# Patient Record
Sex: Female | Born: 1965
Health system: Southern US, Community
[De-identification: ages and names within clinical notes are randomized; demographics above are authoritative.]

## PROBLEM LIST (undated history)

## (undated) DIAGNOSIS — T4145XA Adverse effect of unspecified anesthetic, initial encounter: Secondary | ICD-10-CM

## (undated) DIAGNOSIS — Z90710 Acquired absence of both cervix and uterus: Secondary | ICD-10-CM

## (undated) DIAGNOSIS — I341 Nonrheumatic mitral (valve) prolapse: Secondary | ICD-10-CM

## (undated) DIAGNOSIS — Z1509 Genetic susceptibility to other malignant neoplasm: Secondary | ICD-10-CM

## (undated) DIAGNOSIS — I480 Paroxysmal atrial fibrillation: Secondary | ICD-10-CM

## (undated) DIAGNOSIS — Z86010 Personal history of colonic polyps: Secondary | ICD-10-CM

## (undated) DIAGNOSIS — F99 Mental disorder, not otherwise specified: Secondary | ICD-10-CM

## (undated) DIAGNOSIS — C179 Malignant neoplasm of small intestine, unspecified: Secondary | ICD-10-CM

## (undated) DIAGNOSIS — T8859XA Other complications of anesthesia, initial encounter: Secondary | ICD-10-CM

## (undated) DIAGNOSIS — K5730 Diverticulosis of large intestine without perforation or abscess without bleeding: Secondary | ICD-10-CM

## (undated) DIAGNOSIS — I471 Supraventricular tachycardia, unspecified: Secondary | ICD-10-CM

## (undated) DIAGNOSIS — IMO0002 Reserved for concepts with insufficient information to code with codable children: Secondary | ICD-10-CM

## (undated) DIAGNOSIS — F419 Anxiety disorder, unspecified: Secondary | ICD-10-CM

## (undated) DIAGNOSIS — R0602 Shortness of breath: Secondary | ICD-10-CM

## (undated) HISTORY — PX: ABDOMINAL HYSTERECTOMY: SHX81

## (undated) HISTORY — DX: Reserved for concepts with insufficient information to code with codable children: IMO0002

## (undated) HISTORY — DX: Nonrheumatic mitral (valve) prolapse: I34.1

## (undated) HISTORY — PX: BACK SURGERY: SHX140

## (undated) HISTORY — DX: Acquired absence of both cervix and uterus: Z90.710

## (undated) HISTORY — PX: TONSILLECTOMY: SUR1361

## (undated) HISTORY — PX: URETHRA SURGERY: SHX824

## (undated) HISTORY — DX: Genetic susceptibility to other malignant neoplasm: Z15.09

## (undated) HISTORY — DX: Supraventricular tachycardia: I47.1

## (undated) HISTORY — PX: CARDIAC ELECTROPHYSIOLOGY MAPPING AND ABLATION: SHX1292

## (undated) HISTORY — DX: Diverticulosis of large intestine without perforation or abscess without bleeding: K57.30

## (undated) HISTORY — DX: Paroxysmal atrial fibrillation: I48.0

## (undated) HISTORY — DX: Personal history of colonic polyps: Z86.010

## (undated) HISTORY — PX: COLONOSCOPY: SHX174

## (undated) HISTORY — DX: Malignant neoplasm of small intestine, unspecified: C17.9

## (undated) HISTORY — DX: Supraventricular tachycardia, unspecified: I47.10

## (undated) HISTORY — PX: ABDOMINAL HERNIA REPAIR: SHX539

---

## 2004-11-08 ENCOUNTER — Ambulatory Visit: Payer: Self-pay | Admitting: Family Medicine

## 2004-11-27 ENCOUNTER — Ambulatory Visit: Payer: Self-pay | Admitting: Family Medicine

## 2005-01-09 ENCOUNTER — Other Ambulatory Visit: Admission: RE | Admit: 2005-01-09 | Discharge: 2005-01-09 | Payer: Self-pay | Admitting: Family Medicine

## 2005-01-09 ENCOUNTER — Ambulatory Visit: Payer: Self-pay | Admitting: Family Medicine

## 2005-01-09 LAB — CONVERTED CEMR LAB: Pap Smear: NORMAL

## 2005-01-16 ENCOUNTER — Ambulatory Visit: Payer: Self-pay | Admitting: Family Medicine

## 2005-02-20 ENCOUNTER — Ambulatory Visit: Payer: Self-pay | Admitting: Family Medicine

## 2005-02-27 ENCOUNTER — Ambulatory Visit: Payer: Self-pay | Admitting: Family Medicine

## 2005-03-13 ENCOUNTER — Ambulatory Visit: Payer: Self-pay | Admitting: Family Medicine

## 2005-04-10 ENCOUNTER — Ambulatory Visit: Payer: Self-pay | Admitting: Family Medicine

## 2005-10-02 ENCOUNTER — Ambulatory Visit: Payer: Self-pay | Admitting: Family Medicine

## 2005-10-09 DIAGNOSIS — I059 Rheumatic mitral valve disease, unspecified: Secondary | ICD-10-CM | POA: Insufficient documentation

## 2005-10-09 DIAGNOSIS — F331 Major depressive disorder, recurrent, moderate: Secondary | ICD-10-CM | POA: Insufficient documentation

## 2005-10-09 DIAGNOSIS — I4891 Unspecified atrial fibrillation: Secondary | ICD-10-CM

## 2005-10-09 DIAGNOSIS — F17200 Nicotine dependence, unspecified, uncomplicated: Secondary | ICD-10-CM | POA: Insufficient documentation

## 2005-10-09 DIAGNOSIS — IMO0002 Reserved for concepts with insufficient information to code with codable children: Secondary | ICD-10-CM | POA: Insufficient documentation

## 2005-10-09 DIAGNOSIS — F339 Major depressive disorder, recurrent, unspecified: Secondary | ICD-10-CM

## 2005-10-09 DIAGNOSIS — F411 Generalized anxiety disorder: Secondary | ICD-10-CM | POA: Insufficient documentation

## 2005-11-16 ENCOUNTER — Encounter: Payer: Self-pay | Admitting: Family Medicine

## 2005-11-19 ENCOUNTER — Ambulatory Visit: Payer: Self-pay | Admitting: Family Medicine

## 2005-11-19 DIAGNOSIS — K5909 Other constipation: Secondary | ICD-10-CM

## 2005-11-21 ENCOUNTER — Encounter: Admission: RE | Admit: 2005-11-21 | Discharge: 2005-11-21 | Payer: Self-pay | Admitting: Family Medicine

## 2006-01-28 ENCOUNTER — Ambulatory Visit: Payer: Self-pay | Admitting: Family Medicine

## 2006-01-28 LAB — CONVERTED CEMR LAB: Rapid Strep: NEGATIVE

## 2006-02-05 ENCOUNTER — Ambulatory Visit: Payer: Self-pay | Admitting: Family Medicine

## 2006-02-06 LAB — CONVERTED CEMR LAB
ALT: 11 units/L (ref 0–35)
BUN: 19 mg/dL (ref 6–23)
CO2: 15 meq/L — ABNORMAL LOW (ref 19–32)
Calcium: 8.9 mg/dL (ref 8.4–10.5)
Chloride: 112 meq/L (ref 96–112)
Creatinine, Ser: 0.71 mg/dL (ref 0.40–1.20)
Glucose, Bld: 77 mg/dL (ref 70–99)
Hemoglobin: 13 g/dL (ref 12.0–15.0)
Lymphocytes Relative: 41 % (ref 12–46)
Lymphs Abs: 5.2 10*3/uL — ABNORMAL HIGH (ref 0.7–3.3)
Monocytes Absolute: 1.1 10*3/uL — ABNORMAL HIGH (ref 0.2–0.7)
Monocytes Relative: 9 % (ref 3–11)
Neutro Abs: 6.2 10*3/uL (ref 1.7–7.7)
RBC: 4.17 M/uL (ref 3.87–5.11)
TSH: 1.268 microintl units/mL (ref 0.350–5.50)

## 2006-02-11 ENCOUNTER — Telehealth: Payer: Self-pay | Admitting: Family Medicine

## 2006-02-15 ENCOUNTER — Ambulatory Visit: Payer: Self-pay | Admitting: Family Medicine

## 2006-02-15 LAB — CONVERTED CEMR LAB
Bilirubin Urine: NEGATIVE
Blood in Urine, dipstick: NEGATIVE
Glucose, Urine, Semiquant: NEGATIVE
Ketones, urine, test strip: NEGATIVE
Nitrite: NEGATIVE
Protein, U semiquant: NEGATIVE
Specific Gravity, Urine: 1.02
Urobilinogen, UA: NEGATIVE
WBC Urine, dipstick: NEGATIVE
pH: 5.5

## 2006-02-16 ENCOUNTER — Encounter: Payer: Self-pay | Admitting: Family Medicine

## 2006-02-16 LAB — CONVERTED CEMR LAB
Basophils Relative: 1 % (ref 0–1)
Eosinophils Absolute: 0.1 10*3/uL (ref 0.0–0.7)
Glucose, Bld: 89 mg/dL (ref 70–99)
Lymphs Abs: 4.1 10*3/uL — ABNORMAL HIGH (ref 0.7–3.3)
MCV: 97 fL (ref 78.0–100.0)
Neutrophils Relative %: 43 % (ref 43–77)
Platelets: 315 10*3/uL (ref 150–400)
Potassium: 4.5 meq/L (ref 3.5–5.3)
Sodium: 141 meq/L (ref 135–145)
Vit D, 1,25-Dihydroxy: 58 — ABNORMAL HIGH (ref 20–57)
WBC: 8.7 10*3/uL (ref 4.0–10.5)

## 2006-02-20 ENCOUNTER — Telehealth: Payer: Self-pay | Admitting: Family Medicine

## 2006-02-21 ENCOUNTER — Encounter: Payer: Self-pay | Admitting: Family Medicine

## 2006-02-21 ENCOUNTER — Telehealth: Payer: Self-pay | Admitting: Family Medicine

## 2006-03-12 ENCOUNTER — Encounter: Payer: Self-pay | Admitting: Family Medicine

## 2006-04-15 ENCOUNTER — Encounter: Payer: Self-pay | Admitting: Family Medicine

## 2006-05-03 ENCOUNTER — Ambulatory Visit: Payer: Self-pay | Admitting: Family Medicine

## 2006-05-03 ENCOUNTER — Telehealth (INDEPENDENT_AMBULATORY_CARE_PROVIDER_SITE_OTHER): Payer: Self-pay | Admitting: *Deleted

## 2006-05-03 ENCOUNTER — Encounter: Payer: Self-pay | Admitting: Family Medicine

## 2006-05-03 LAB — CONVERTED CEMR LAB
CO2: 19 meq/L (ref 19–32)
Calcium: 8.9 mg/dL (ref 8.4–10.5)
Creatinine, Ser: 0.69 mg/dL (ref 0.40–1.20)
Eosinophils Relative: 1 % (ref 0–5)
Glucose, Bld: 86 mg/dL (ref 70–99)
HCT: 42.5 % (ref 36.0–46.0)
Hemoglobin: 14.6 g/dL (ref 12.0–15.0)
Lipase: 23 units/L (ref 0–75)
Lymphocytes Relative: 36 % (ref 12–46)
Lymphs Abs: 3.3 10*3/uL (ref 0.7–3.3)
Monocytes Absolute: 0.7 10*3/uL (ref 0.2–0.7)
Monocytes Relative: 8 % (ref 3–11)
RBC: 4.39 M/uL (ref 3.87–5.11)
Total Bilirubin: 0.4 mg/dL (ref 0.3–1.2)
Total Protein: 6.8 g/dL (ref 6.0–8.3)
WBC: 9.4 10*3/uL (ref 4.0–10.5)

## 2006-05-09 ENCOUNTER — Telehealth (INDEPENDENT_AMBULATORY_CARE_PROVIDER_SITE_OTHER): Payer: Self-pay | Admitting: *Deleted

## 2006-05-21 ENCOUNTER — Ambulatory Visit: Payer: Self-pay | Admitting: Gastroenterology

## 2006-05-21 LAB — CONVERTED CEMR LAB
CRP, High Sensitivity: 2 (ref 0.00–5.00)
Sed Rate: 8 mm/hr (ref 0–25)

## 2006-06-03 ENCOUNTER — Encounter: Payer: Self-pay | Admitting: Family Medicine

## 2006-06-10 ENCOUNTER — Encounter: Payer: Self-pay | Admitting: Family Medicine

## 2006-06-11 ENCOUNTER — Encounter: Payer: Self-pay | Admitting: Family Medicine

## 2006-06-26 ENCOUNTER — Encounter: Payer: Self-pay | Admitting: Gastroenterology

## 2006-06-26 ENCOUNTER — Ambulatory Visit: Payer: Self-pay | Admitting: Gastroenterology

## 2006-06-26 ENCOUNTER — Encounter: Payer: Self-pay | Admitting: Family Medicine

## 2006-07-10 ENCOUNTER — Ambulatory Visit: Payer: Self-pay | Admitting: Gastroenterology

## 2006-07-24 ENCOUNTER — Encounter: Payer: Self-pay | Admitting: Family Medicine

## 2006-07-24 ENCOUNTER — Ambulatory Visit: Payer: Self-pay | Admitting: Gastroenterology

## 2006-07-24 ENCOUNTER — Encounter: Payer: Self-pay | Admitting: Gastroenterology

## 2006-08-01 ENCOUNTER — Ambulatory Visit: Payer: Self-pay | Admitting: Oncology

## 2006-10-24 ENCOUNTER — Ambulatory Visit: Payer: Self-pay | Admitting: Family Medicine

## 2006-10-25 ENCOUNTER — Telehealth (INDEPENDENT_AMBULATORY_CARE_PROVIDER_SITE_OTHER): Payer: Self-pay | Admitting: *Deleted

## 2006-10-25 LAB — CONVERTED CEMR LAB
Basophils Relative: 0 % (ref 0–1)
Eosinophils Absolute: 0.1 10*3/uL (ref 0.0–0.7)
Eosinophils Relative: 1 % (ref 0–5)
HCT: 39.8 % (ref 36.0–46.0)
MCHC: 34.7 g/dL (ref 30.0–36.0)
MCV: 94.5 fL (ref 78.0–100.0)
Monocytes Relative: 7 % (ref 3–11)
Neutrophils Relative %: 74 % (ref 43–77)
RBC: 4.21 M/uL (ref 3.87–5.11)

## 2006-11-04 ENCOUNTER — Ambulatory Visit: Payer: Self-pay | Admitting: Family Medicine

## 2006-11-04 DIAGNOSIS — N390 Urinary tract infection, site not specified: Secondary | ICD-10-CM | POA: Insufficient documentation

## 2006-11-04 LAB — CONVERTED CEMR LAB
Alkaline Phosphatase: 89 units/L (ref 39–117)
BUN: 12 mg/dL (ref 6–23)
Blood in Urine, dipstick: NEGATIVE
CO2: 19 meq/L (ref 19–32)
Creatinine, Ser: 0.84 mg/dL (ref 0.40–1.20)
Glucose, Bld: 93 mg/dL (ref 70–99)
Lipase: 25 units/L (ref 0–75)
Sodium: 142 meq/L (ref 135–145)
Specific Gravity, Urine: 1.015
Total Bilirubin: 0.2 mg/dL — ABNORMAL LOW (ref 0.3–1.2)
Urobilinogen, UA: 4
pH: 5

## 2006-11-06 ENCOUNTER — Telehealth (INDEPENDENT_AMBULATORY_CARE_PROVIDER_SITE_OTHER): Payer: Self-pay | Admitting: *Deleted

## 2006-11-14 ENCOUNTER — Ambulatory Visit: Payer: Self-pay | Admitting: Family Medicine

## 2006-11-14 LAB — CONVERTED CEMR LAB
Bilirubin Urine: NEGATIVE
Blood in Urine, dipstick: NEGATIVE
Glucose, Urine, Semiquant: NEGATIVE
Ketones, urine, test strip: NEGATIVE
Protein, U semiquant: NEGATIVE
Specific Gravity, Urine: 1.015
WBC Urine, dipstick: NEGATIVE
pH: 6.5

## 2006-11-15 ENCOUNTER — Encounter: Payer: Self-pay | Admitting: Family Medicine

## 2006-11-18 ENCOUNTER — Telehealth (INDEPENDENT_AMBULATORY_CARE_PROVIDER_SITE_OTHER): Payer: Self-pay | Admitting: *Deleted

## 2006-11-19 ENCOUNTER — Ambulatory Visit: Payer: Self-pay | Admitting: Gastroenterology

## 2006-11-19 LAB — CONVERTED CEMR LAB
Basophils Relative: 0.6 % (ref 0.0–1.0)
CRP, High Sensitivity: 2 (ref 0.00–5.00)
Eosinophils Relative: 1 % (ref 0.0–5.0)
Folate: >20 ng/mL
Hemoglobin: 12.8 g/dL (ref 12.0–15.0)
Lymphocytes Relative: 44.5 % (ref 12.0–46.0)
Monocytes Absolute: 0.9 10*3/uL — ABNORMAL HIGH (ref 0.2–0.7)
Monocytes Relative: 9.5 % (ref 3.0–11.0)
Neutro Abs: 4.4 10*3/uL (ref 1.4–7.7)
RDW: 12.1 % (ref 11.5–14.6)
Saturation Ratios: 20.7 % (ref 20.0–50.0)
Sed Rate: 11 mm/hr (ref 0–25)
Transferrin: 244.8 mg/dL (ref >=212.0)
Vitamin B-12: >1500 pg/mL — ABNORMAL HIGH (ref 211–911)
WBC: 10 10*3/uL (ref 4.5–10.5)

## 2006-11-21 ENCOUNTER — Ambulatory Visit: Payer: Self-pay | Admitting: Cardiology

## 2006-12-04 ENCOUNTER — Encounter: Payer: Self-pay | Admitting: Family Medicine

## 2007-02-06 ENCOUNTER — Telehealth: Payer: Self-pay | Admitting: Family Medicine

## 2007-02-06 DIAGNOSIS — N83209 Unspecified ovarian cyst, unspecified side: Secondary | ICD-10-CM | POA: Insufficient documentation

## 2007-02-20 DIAGNOSIS — K5730 Diverticulosis of large intestine without perforation or abscess without bleeding: Secondary | ICD-10-CM | POA: Insufficient documentation

## 2007-02-26 ENCOUNTER — Encounter: Payer: Self-pay | Admitting: Obstetrics & Gynecology

## 2007-02-26 ENCOUNTER — Ambulatory Visit: Payer: Self-pay | Admitting: Obstetrics & Gynecology

## 2007-06-10 ENCOUNTER — Telehealth: Payer: Self-pay | Admitting: Gastroenterology

## 2007-06-16 ENCOUNTER — Encounter: Payer: Self-pay | Admitting: Gastroenterology

## 2007-06-16 ENCOUNTER — Ambulatory Visit: Payer: Self-pay | Admitting: Gastroenterology

## 2007-06-17 ENCOUNTER — Encounter: Payer: Self-pay | Admitting: Gastroenterology

## 2007-06-20 ENCOUNTER — Ambulatory Visit: Payer: Self-pay | Admitting: Family Medicine

## 2007-06-20 LAB — CONVERTED CEMR LAB
Basophils Absolute: 0 10*3/uL (ref 0.0–0.1)
Basophils Relative: 0 % (ref 0–1)
Eosinophils Absolute: 0.3 10*3/uL (ref 0.0–0.7)
Eosinophils Relative: 2 % (ref 0–5)
Hemoglobin: 13.7 g/dL (ref 12.0–15.0)
MCHC: 33.1 g/dL (ref 30.0–36.0)
MCV: 97.7 fL (ref 78.0–100.0)
Monocytes Absolute: 0.6 10*3/uL (ref 0.1–1.0)
Monocytes Relative: 4 % (ref 3–12)
RBC: 4.23 M/uL (ref 3.87–5.11)
RDW: 12.9 % (ref 11.5–15.5)

## 2007-06-26 ENCOUNTER — Encounter: Payer: Self-pay | Admitting: Gastroenterology

## 2007-06-26 ENCOUNTER — Ambulatory Visit (HOSPITAL_COMMUNITY): Admission: RE | Admit: 2007-06-26 | Discharge: 2007-06-26 | Payer: Self-pay | Admitting: Gastroenterology

## 2007-06-26 DIAGNOSIS — Z860100 Personal history of colon polyps, unspecified: Secondary | ICD-10-CM

## 2007-06-26 HISTORY — DX: Personal history of colon polyps, unspecified: Z86.0100

## 2007-06-26 HISTORY — DX: Personal history of colonic polyps: Z86.010

## 2007-06-27 ENCOUNTER — Encounter: Payer: Self-pay | Admitting: Gastroenterology

## 2007-06-30 ENCOUNTER — Ambulatory Visit: Payer: Self-pay | Admitting: Gastroenterology

## 2007-10-09 ENCOUNTER — Ambulatory Visit: Payer: Self-pay | Admitting: Family Medicine

## 2007-10-17 ENCOUNTER — Telehealth: Payer: Self-pay | Admitting: Family Medicine

## 2007-10-17 ENCOUNTER — Ambulatory Visit: Payer: Self-pay | Admitting: Occupational Medicine

## 2007-10-17 ENCOUNTER — Emergency Department (HOSPITAL_BASED_OUTPATIENT_CLINIC_OR_DEPARTMENT_OTHER): Admission: EM | Admit: 2007-10-17 | Discharge: 2007-10-17 | Payer: Self-pay | Admitting: Emergency Medicine

## 2007-10-23 ENCOUNTER — Ambulatory Visit: Payer: Self-pay | Admitting: Family Medicine

## 2007-10-23 ENCOUNTER — Encounter: Admission: RE | Admit: 2007-10-23 | Discharge: 2007-10-23 | Payer: Self-pay | Admitting: Family Medicine

## 2007-11-11 ENCOUNTER — Ambulatory Visit: Payer: Self-pay | Admitting: Occupational Medicine

## 2008-05-06 ENCOUNTER — Ambulatory Visit: Payer: Self-pay | Admitting: Family Medicine

## 2008-05-06 DIAGNOSIS — N949 Unspecified condition associated with female genital organs and menstrual cycle: Secondary | ICD-10-CM

## 2008-05-06 DIAGNOSIS — N925 Other specified irregular menstruation: Secondary | ICD-10-CM | POA: Insufficient documentation

## 2008-05-12 ENCOUNTER — Encounter: Payer: Self-pay | Admitting: Family Medicine

## 2008-05-12 LAB — CONVERTED CEMR LAB
AST: 20 units/L (ref 0–37)
Albumin: 4.3 g/dL (ref 3.5–5.2)
Alkaline Phosphatase: 82 units/L (ref 39–117)
BUN: 8 mg/dL (ref 6–23)
Creatinine, Ser: 0.8 mg/dL (ref 0.40–1.20)
FSH: 5.8 milliintl units/mL
Glucose, Bld: 90 mg/dL (ref 70–99)
LH: 5.8 milliintl units/mL
TSH: 1.877 microintl units/mL (ref 0.350–4.500)
Total Bilirubin: 0.4 mg/dL (ref 0.3–1.2)
Triglycerides: 96 mg/dL (ref <150)

## 2008-05-25 ENCOUNTER — Ambulatory Visit (HOSPITAL_BASED_OUTPATIENT_CLINIC_OR_DEPARTMENT_OTHER): Admission: RE | Admit: 2008-05-25 | Discharge: 2008-05-25 | Payer: Self-pay | Admitting: Family Medicine

## 2008-05-25 ENCOUNTER — Ambulatory Visit: Payer: Self-pay | Admitting: Diagnostic Radiology

## 2008-05-25 ENCOUNTER — Ambulatory Visit: Payer: Self-pay | Admitting: Family Medicine

## 2008-05-25 DIAGNOSIS — IMO0001 Reserved for inherently not codable concepts without codable children: Secondary | ICD-10-CM | POA: Insufficient documentation

## 2008-05-26 ENCOUNTER — Encounter: Payer: Self-pay | Admitting: Family Medicine

## 2008-05-27 LAB — CONVERTED CEMR LAB
Rhuematoid fact SerPl-aCnc: <20 intl units/mL (ref 0.0–20.0)
Total CK: 64 units/L (ref 7–177)

## 2008-07-01 ENCOUNTER — Encounter: Admission: RE | Admit: 2008-07-01 | Discharge: 2008-07-01 | Payer: Self-pay | Admitting: Family Medicine

## 2008-07-01 ENCOUNTER — Ambulatory Visit: Payer: Self-pay | Admitting: Family Medicine

## 2008-07-01 DIAGNOSIS — J449 Chronic obstructive pulmonary disease, unspecified: Secondary | ICD-10-CM

## 2008-07-06 ENCOUNTER — Telehealth: Payer: Self-pay | Admitting: Family Medicine

## 2008-07-13 ENCOUNTER — Telehealth (INDEPENDENT_AMBULATORY_CARE_PROVIDER_SITE_OTHER): Payer: Self-pay | Admitting: *Deleted

## 2008-08-31 ENCOUNTER — Telehealth (INDEPENDENT_AMBULATORY_CARE_PROVIDER_SITE_OTHER): Payer: Self-pay | Admitting: *Deleted

## 2008-10-09 ENCOUNTER — Encounter
Admission: RE | Admit: 2008-10-09 | Discharge: 2008-10-09 | Payer: Self-pay | Admitting: Physical Medicine and Rehabilitation

## 2008-11-15 ENCOUNTER — Ambulatory Visit: Payer: Self-pay | Admitting: Family Medicine

## 2008-11-15 DIAGNOSIS — R1013 Epigastric pain: Secondary | ICD-10-CM | POA: Insufficient documentation

## 2008-11-16 ENCOUNTER — Encounter: Payer: Self-pay | Admitting: Family Medicine

## 2008-11-16 ENCOUNTER — Encounter: Admission: RE | Admit: 2008-11-16 | Discharge: 2008-11-16 | Payer: Self-pay | Admitting: Family Medicine

## 2008-11-16 LAB — CONVERTED CEMR LAB
Alkaline Phosphatase: 80 units/L (ref 39–117)
Amylase: 24 units/L (ref 0–105)
BUN: 13 mg/dL (ref 6–23)
Basophils Absolute: 0.1 10*3/uL (ref 0.0–0.1)
Basophils Relative: 1 % (ref 0–1)
CO2: 19 meq/L (ref 19–32)
Creatinine, Ser: 0.71 mg/dL (ref 0.40–1.20)
Eosinophils Absolute: 0.2 10*3/uL (ref 0.0–0.7)
Glucose, Bld: 86 mg/dL (ref 70–99)
Hemoglobin: 13.7 g/dL (ref 12.0–15.0)
Lipase: 15 units/L (ref 0–75)
MCHC: 33.7 g/dL (ref 30.0–36.0)
MCV: 93.5 fL (ref 78.0–100.0)
Monocytes Absolute: 0.9 10*3/uL (ref 0.1–1.0)
Neutro Abs: 5.8 10*3/uL (ref 1.7–7.7)
Neutrophils Relative %: 53 % (ref 43–77)
RDW: 13.3 % (ref 11.5–15.5)
Sodium: 140 meq/L (ref 135–145)
T3, Free: 2.6 pg/mL (ref 2.3–4.2)
TSH: 1.51 microintl units/mL (ref 0.350–4.500)
Total Bilirubin: 0.2 mg/dL — ABNORMAL LOW (ref 0.3–1.2)
Total Protein: 6.7 g/dL (ref 6.0–8.3)

## 2008-11-19 ENCOUNTER — Ambulatory Visit: Payer: Self-pay | Admitting: Gastroenterology

## 2008-11-19 LAB — CONVERTED CEMR LAB
CRP, High Sensitivity: 3.4 (ref 0.00–5.00)
Sed Rate: 10 mm/hr (ref 0–22)

## 2008-11-23 ENCOUNTER — Telehealth: Payer: Self-pay | Admitting: Gastroenterology

## 2008-12-02 ENCOUNTER — Ambulatory Visit (HOSPITAL_COMMUNITY): Admission: RE | Admit: 2008-12-02 | Discharge: 2008-12-02 | Payer: Self-pay | Admitting: Gastroenterology

## 2008-12-07 ENCOUNTER — Telehealth: Payer: Self-pay | Admitting: Gastroenterology

## 2009-07-26 ENCOUNTER — Encounter: Payer: Self-pay | Admitting: Family Medicine

## 2009-09-10 ENCOUNTER — Emergency Department (HOSPITAL_BASED_OUTPATIENT_CLINIC_OR_DEPARTMENT_OTHER): Admission: EM | Admit: 2009-09-10 | Discharge: 2009-09-10 | Payer: Self-pay | Admitting: Emergency Medicine

## 2009-09-10 ENCOUNTER — Ambulatory Visit: Payer: Self-pay | Admitting: Interventional Radiology

## 2009-09-16 ENCOUNTER — Ambulatory Visit: Payer: Self-pay | Admitting: Family Medicine

## 2009-09-16 DIAGNOSIS — M79609 Pain in unspecified limb: Secondary | ICD-10-CM

## 2009-09-16 DIAGNOSIS — R609 Edema, unspecified: Secondary | ICD-10-CM

## 2009-09-16 LAB — CONVERTED CEMR LAB
Nitrite: NEGATIVE
Specific Gravity, Urine: >=1.03
Urobilinogen, UA: 0.2
WBC Urine, dipstick: NEGATIVE

## 2009-09-17 ENCOUNTER — Encounter: Payer: Self-pay | Admitting: Family Medicine

## 2009-09-17 ENCOUNTER — Emergency Department (HOSPITAL_COMMUNITY): Admission: EM | Admit: 2009-09-17 | Discharge: 2009-09-17 | Payer: Self-pay | Admitting: Emergency Medicine

## 2009-09-17 ENCOUNTER — Ambulatory Visit: Payer: Self-pay | Admitting: Surgery

## 2009-09-17 ENCOUNTER — Encounter (INDEPENDENT_AMBULATORY_CARE_PROVIDER_SITE_OTHER): Payer: Self-pay | Admitting: Emergency Medicine

## 2009-09-17 LAB — CONVERTED CEMR LAB: ALT: 24 units/L (ref 0–35)

## 2009-11-18 ENCOUNTER — Ambulatory Visit: Payer: Self-pay | Admitting: Family Medicine

## 2009-11-18 LAB — CONVERTED CEMR LAB
Glucose, Urine, Semiquant: NEGATIVE
Protein, U semiquant: NEGATIVE
Specific Gravity, Urine: >=1.03
WBC Urine, dipstick: NEGATIVE

## 2009-11-19 ENCOUNTER — Encounter: Payer: Self-pay | Admitting: Family Medicine

## 2009-11-20 LAB — CONVERTED CEMR LAB: Trich, Wet Prep: NONE SEEN

## 2009-11-23 ENCOUNTER — Ambulatory Visit: Payer: Self-pay | Admitting: Family Medicine

## 2009-11-23 ENCOUNTER — Telehealth: Payer: Self-pay | Admitting: Family Medicine

## 2009-11-23 ENCOUNTER — Encounter
Admission: RE | Admit: 2009-11-23 | Discharge: 2009-11-23 | Payer: Self-pay | Source: Home / Self Care | Admitting: Family Medicine

## 2009-11-23 DIAGNOSIS — J189 Pneumonia, unspecified organism: Secondary | ICD-10-CM | POA: Insufficient documentation

## 2009-11-23 DIAGNOSIS — R05 Cough: Secondary | ICD-10-CM

## 2009-11-28 LAB — CONVERTED CEMR LAB
ANA Titer 1: NEGATIVE
Albumin: 4.6 g/dL (ref 3.5–5.2)
Anti Nuclear Antibody(ANA): POSITIVE — AB
CO2: 23 meq/L (ref 19–32)
Calcium: 9 mg/dL (ref 8.4–10.5)
Chloride: 107 meq/L (ref 96–112)
Eosinophils Absolute: 0.3 10*3/uL (ref 0.0–0.7)
Glucose, Bld: 74 mg/dL (ref 70–99)
HCT: 37.4 % (ref 36.0–46.0)
Lymphs Abs: 3.4 10*3/uL (ref 0.7–4.0)
MCV: 92.6 fL (ref 78.0–100.0)
Monocytes Relative: 8 % (ref 3–12)
Neutrophils Relative %: 64 % (ref 43–77)
Potassium: 3.6 meq/L (ref 3.5–5.3)
RBC: 4.04 M/uL (ref 3.87–5.11)
Sodium: 141 meq/L (ref 135–145)
Total Bilirubin: 0.3 mg/dL (ref 0.3–1.2)
Total Protein: 6.6 g/dL (ref 6.0–8.3)
WBC: 13.7 10*3/uL — ABNORMAL HIGH (ref 4.0–10.5)

## 2009-12-06 ENCOUNTER — Ambulatory Visit: Payer: Self-pay | Admitting: Family Medicine

## 2009-12-06 DIAGNOSIS — N912 Amenorrhea, unspecified: Secondary | ICD-10-CM | POA: Insufficient documentation

## 2009-12-15 ENCOUNTER — Encounter: Payer: Self-pay | Admitting: Family Medicine

## 2009-12-16 LAB — CONVERTED CEMR LAB
Basophils Relative: 0 % (ref 0–1)
Eosinophils Absolute: 0.2 10*3/uL (ref 0.0–0.7)
FSH: 18.7 milliintl units/mL
LH: 9.8 milliintl units/mL
Lymphs Abs: 2.8 10*3/uL (ref 0.7–4.0)
MCHC: 33.1 g/dL (ref 30.0–36.0)
MCV: 92.6 fL (ref 78.0–100.0)
Neutro Abs: 2.4 10*3/uL (ref 1.7–7.7)
Neutrophils Relative %: 40 % — ABNORMAL LOW (ref 43–77)
Platelets: 339 10*3/uL (ref 150–400)
RBC: 4.31 M/uL (ref 3.87–5.11)
WBC: 6 10*3/uL (ref 4.0–10.5)

## 2010-01-03 ENCOUNTER — Encounter
Admission: RE | Admit: 2010-01-03 | Discharge: 2010-01-03 | Payer: Self-pay | Source: Home / Self Care | Attending: Family Medicine | Admitting: Family Medicine
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
## 2010-01-31 NOTE — Letter (Signed)
Summary: [facility]  [facility]   Imported By: Lanelle Bal 08/16/2009 14:06:34  _____________________________________________________________________  External Attachment:    Type:   Image     Comment:   External Document
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
## 2010-01-31 NOTE — Assessment & Plan Note (Signed)
Summary: UTI, Vaginitis   Vital Signs:  Patient profile:   45 year old female Height:      64.5 inches Weight:      128 pounds Temp:     98.1 degrees F oral Pulse rate:   100 / minute BP sitting:   121 / 70  (left arm) Cuff size:   regular  Vitals Entered By: Avon Gully CMA, Duncan Dull) (November 18, 2009 11:38 AM) CC: dysuria, frequency, dark urine since Tuesday   Primary Care Provider:  Seymour Bars D.O.  CC:  dysuria, frequency, and dark urine since Tuesday.  History of Present Illness: dysuria, frequency, dark urine since Tuesday. Has been keeping her awake. Mild pelvic pain. Taking cystex and helped some. Then tried concentrated cranberry juice.  Urine looked brown for about 2 days.  Feels she has had some low grade tempson and off. Felt sweaty but didn't measure her temperature. Seems worse at night. Also thinks she has a yeast infection since had keflex last month while had a nerve stimulator in her back. Says tried OTC monistat tx and it didn't really get much better.   Current Medications (verified): 1)  Provigil 200 Mg Tabs (Modafinil) .... Take 1 and 1/2 Tabet By Mouth Two Times A Day 2)  Daily Multiple Vitamins  Tabs (Multiple Vitamin) .Marland Kitchen.. 1 By Mouth Once Daily 3)  Camila 0.35 Mg Tabs (Norethindrone (Contraceptive)) .... Take 1 Tablet By Mouth Once A Day 4)  Topamax 100 Mg Tabs (Topiramate) .... Take 3 Tablets Daily At Bedtime 5)  Promethazine Hcl 25 Mg Tabs (Promethazine Hcl) .Marland Kitchen.. 1 Tab By Mouth Q 4-6 Hr As Needed Nausea 6)  Klonopin 1 Mg Tabs (Clonazepam) .... Take 1/2 Tab Two Times A Day and 2 Tabs By Mouth At Bedtime 7)  Adderall 10 Mg Tabs (Amphetamine-Dextroamphetamine) .... Take 3 Tablets in Am and 1 Tablet By Mouth At Noon 8)  Lexapro 20 Mg Tabs (Escitalopram Oxalate) .... Take 1 Tablet By Mouth Once A Day 9)  Deplin 15 Mg Tabs (L-Methylfolate) .... Take 1 Tablet By Mouth Once A Day 10)  Methocarbamol 750 Mg Tabs (Methocarbamol) .... Take 1-2 Tabs By Mouth At  Bedtime As Needed 11)  Prevacid 30 Mg  Cpdr (Lansoprazole) .Marland Kitchen.. 1 Capsule Each Day 30 Minutes Before Meal 12)  Levsin/sl 0.125 Mg Subl (Hyoscyamine Sulfate) .Marland Kitchen.. 1 Sl Q 4 Hrs Prn 13)  Ms Contin 30 Mg Xr12h-Tab (Morphine Sulfate) .... Take 1 Tab By Mouth Two Times A Day 14)  Morphine Sulfate Cr 30 Mg Xr12h-Tab (Morphine Sulfate) .... Take 1 Tab By Mouth Three Times A Day 15)  Viibryd 40 Mg Tabs (Vilazodone Hcl) .... Take 1 Tab By Mouth Before Breakfast 16)  Deplin 15 Mg Tabs (L-Methylfolate) .... Take 1 Tab By Mouth Once Daily After Breakfast 17)  Baclofen 20 Mg Tabs (Baclofen) .... Take 1 Tab Up To Three Times A Day As Needed 18)  Promethazine Hcl 25 Mg Tabs (Promethazine Hcl) .... Take 1 Tab By Mouth Every 4-6 Hours As Needed Nausea 19)  Zyprexa 5 Mg Tabs (Olanzapine) .... Take 1 Tab By Mouth Two Times A Day As Needed 20)  Robaxin-750 750 Mg Tabs (Methocarbamol) .... Take 1-2 Tabs At Bedtime As Needed 21)  Ambien 10 Mg Tabs (Zolpidem Tartrate) .... Take 1/2 To 1 1/2 Tabs By Mouth At Bedtime As Needed 22)  Demadex 20 Mg Tabs (Torsemide) 23)  Spironolactone 25 Mg Tabs (Spironolactone) .Marland Kitchen.. 1 Tab By Mouth Qam  Allergies (verified): 1)  !  Codeine 2)  ! Benzoyl Peroxide (Benzoyl Peroxide) 3)  ! Benadryl 4)  ! Sulfa 5)  ! Erythromycin 6)  ! * Fentanyl Patch 7)  ! Atenolol (Atenolol) 8)  ! Flexeril (Cyclobenzaprine Hcl) 9)  * Albuterol  Comments:  Nurse/Medical Assistant: The patient's medications and allergies were reviewed with the patient and were updated in the Medication and Allergy Lists. Avon Gully CMA, Duncan Dull) (November 18, 2009 11:38 AM)  Physical Exam  General:  Well-developed,well-nourished,in no acute distress; alert,appropriate and cooperative throughout examination Head:  Normocephalic and atraumatic without obvious abnormalities. No apparent alopecia or balding. Heart:  Normal rate and regular rhythm. S1 and S2 normal without gallop, murmur, click, rub or other extra  sounds. Abdomen:  soft and normal bowel sounds.  mild suprbubic tenderness.  Msk:  no CVA tenderness.    Impression & Recommendations:  Problem # 1:  UTI (ICD-599.0) She has sevreal allergies and she was on keflex recently so will tx wtih macrodantin.   Will send culture if she is able to give another sample. We only had enought urine to do the dipstick.  Her updated medication list for this problem includes:    Macrodantin 100 Mg Caps (Nitrofurantoin macrocrystal) ..... One by mouth every 6 hours for 7 days  Orders: UA Dipstick w/o Micro (automated)  (81003) T-Urine Culture (Spectrum Order) [id number])  Encouraged to push clear liquids, get enough rest, and take acetaminophen as needed. To be seen in 10 days if no improvement, sooner if worse.  Problem # 2:  CANDIDIASIS, VAGINAL (ICD-112.1) Self wet prep collected. will go ahed and treat for yeast., Call if sxs don't clear.  Her updated medication list for this problem includes:    Fluconazole 150 Mg Tabs (Fluconazole) .Marland Kitchen... Take 1 tablet by mouth once a day x 1  Orders: T-Wet Prep for Christoper Allegra, [facility] [id number])  Complete Medication List: 1)  Provigil 200 Mg Tabs (Modafinil) .... Take 1 and 1/2 tabet by mouth two times a day 2)  Daily Multiple Vitamins Tabs (Multiple vitamin) .Marland Kitchen.. 1 by mouth once daily 3)  Camila 0.35 Mg Tabs (Norethindrone (contraceptive)) .... Take 1 tablet by mouth once a day 4)  Topamax 100 Mg Tabs (Topiramate) .... Take 3 tablets daily at bedtime 5)  Promethazine Hcl 25 Mg Tabs (Promethazine hcl) .Marland Kitchen.. 1 tab by mouth q 4-6 hr as needed nausea 6)  Klonopin 1 Mg Tabs (Clonazepam) .... Take 1/2 tab two times a day and 2 tabs by mouth at bedtime 7)  Adderall 10 Mg Tabs (Amphetamine-dextroamphetamine) .... Take 3 tablets in am and 1 tablet by mouth at noon 8)  Lexapro 20 Mg Tabs (Escitalopram oxalate) .... Take 1 tablet by mouth once a day 9)  Deplin 15 Mg Tabs (L-methylfolate) .... Take 1 tablet by  mouth once a day 10)  Methocarbamol 750 Mg Tabs (Methocarbamol) .... Take 1-2 tabs by mouth at bedtime as needed 11)  Prevacid 30 Mg Cpdr (Lansoprazole) .Marland Kitchen.. 1 capsule each day 30 minutes before meal 12)  Levsin/sl 0.125 Mg Subl (Hyoscyamine sulfate) .Marland Kitchen.. 1 sl q 4 hrs prn 13)  Ms Contin 30 Mg Xr12h-tab (Morphine sulfate) .... Take 1 tab by mouth two times a day 14)  Morphine Sulfate Cr 30 Mg Xr12h-tab (Morphine sulfate) .... Take 1 tab by mouth three times a day 15)  Viibryd 40 Mg Tabs (Vilazodone hcl) .... Take 1 tab by mouth before breakfast 16)  Deplin 15 Mg Tabs (L-methylfolate) .... Take 1 tab by mouth  once daily after breakfast 17)  Baclofen 20 Mg Tabs (Baclofen) .... Take 1 tab up to three times a day as needed 18)  Promethazine Hcl 25 Mg Tabs (Promethazine hcl) .... Take 1 tab by mouth every 4-6 hours as needed nausea 19)  Zyprexa 5 Mg Tabs (Olanzapine) .... Take 1 tab by mouth two times a day as needed 20)  Robaxin-750 750 Mg Tabs (Methocarbamol) .... Take 1-2 tabs at bedtime as needed 21)  Ambien 10 Mg Tabs (Zolpidem tartrate) .... Take 1/2 to 1 1/2 tabs by mouth at bedtime as needed 22)  Demadex 20 Mg Tabs (Torsemide) 23)  Spironolactone 25 Mg Tabs (Spironolactone) .Marland Kitchen.. 1 tab by mouth qam 24)  Fluconazole 150 Mg Tabs (Fluconazole) .... Take 1 tablet by mouth once a day x 1 25)  Macrodantin 100 Mg Caps (Nitrofurantoin macrocrystal) .... One by mouth every 6 hours for 7 days  Patient Instructions: 1)  Call if yeast infection doesn't clear with the diflucan.  2)  Complete the antibiotic for the UTI. 3)  Stay well hydrated.  Prescriptions: MACRODANTIN 100 MG CAPS (NITROFURANTOIN MACROCRYSTAL) one by mouth every 6 hours for 7 days  #28 x 0   Entered and Authorized by:   Nani Gasser MD   Signed by:   Nani Gasser MD on 11/18/2009   Method used:   [facility] to        [facility] [phone]* (retail)       [address]       [place], [place]  [address]        Ph: [phone] or [phone]       Fax: [phone]   RxID:   [id number] FLUCONAZOLE 150 MG TABS (FLUCONAZOLE) Take 1 tablet by mouth once a day x 1  #1 x 0   Entered and Authorized by:   Nani Gasser MD   Signed by:   Nani Gasser MD on 11/18/2009   Method used:   [facility] to        [facility] [address] [phone]* (retail)       [address]       [place], [place]  [address]       Ph: [phone] or [phone]       Fax: [phone]   RxID:   [id number]    Orders Added: 1)  UA Dipstick w/o Micro (automated)  [81003] 2)  T-Urine Culture (Spectrum Order) [[id number]] 3)  Est. Patient Level III [48546] 4)  T-Wet Prep for [facility] Cells [[id number]]    Laboratory Results   Urine Tests  Date/Time Received: 11/18/2009 Date/Time Reported: 11/18/2009  Routine Urinalysis   Color: yellow Appearance: Clear Glucose: negative   (Normal Range: Negative) Bilirubin: small   (Normal Range: Negative) Ketone: trace (5)   (Normal Range: Negative) Spec. Gravity: >=1.030   (Normal Range: 1.003-1.035) Blood: small   (Normal Range: Negative) pH: 5.5   (Normal Range: 5.0-8.0) Protein: negative   (Normal Range: Negative) Urobilinogen: 0.2   (Normal Range: 0-1) Nitrite: negative   (Normal Range: Negative) Leukocyte Esterace: negative   (Normal Range: Negative)

## 2010-01-31 NOTE — Progress Notes (Signed)
Summary: Not feeling well  Phone Note Call from Patient Call back at [phone]   Caller: Patient Call For: Seymour Bars DO Summary of Call: last night fingers turned purple, shivers, muscle rigidity, and 45 minutes later fever went up 102.4. Muscles ache today and tired.After episode and pain in left upper arm and shoulder but pain has subsided now. Horrible sweats once started back sleeping. Initial call taken by: Kathlene November LPN,  November 23, 2009 11:53 AM  Follow-up for Phone Call        I would recommend an office visit. Follow-up by: Seymour Bars DO,  November 23, 2009 12:07 PM

## 2010-02-07 ENCOUNTER — Other Ambulatory Visit: Payer: Self-pay | Admitting: Family Medicine

## 2010-02-07 ENCOUNTER — Ambulatory Visit
Admission: RE | Admit: 2010-02-07 | Discharge: 2010-02-07 | Disposition: A | Discharge status: Home / Self Care | Payer: Medicare Other | Source: Ambulatory Visit | Attending: Family Medicine | Admitting: Family Medicine

## 2010-02-07 ENCOUNTER — Encounter: Payer: Self-pay | Admitting: Family Medicine

## 2010-02-07 ENCOUNTER — Ambulatory Visit (INDEPENDENT_AMBULATORY_CARE_PROVIDER_SITE_OTHER): Payer: Medicare Other | Admitting: Family Medicine

## 2010-02-07 DIAGNOSIS — R9389 Abnormal findings on diagnostic imaging of other specified body structures: Secondary | ICD-10-CM

## 2010-02-07 DIAGNOSIS — J189 Pneumonia, unspecified organism: Secondary | ICD-10-CM

## 2010-02-07 DIAGNOSIS — R61 Generalized hyperhidrosis: Secondary | ICD-10-CM

## 2010-02-07 DIAGNOSIS — F17200 Nicotine dependence, unspecified, uncomplicated: Secondary | ICD-10-CM

## 2010-02-08 ENCOUNTER — Telehealth (INDEPENDENT_AMBULATORY_CARE_PROVIDER_SITE_OTHER): Payer: Self-pay | Admitting: *Deleted

## 2010-02-08 LAB — CONVERTED CEMR LAB
Basophils Absolute: 0 10*3/uL (ref 0.0–0.1)
Basophils Relative: 0 % (ref 0–1)
Eosinophils Absolute: 0.1 10*3/uL (ref 0.0–0.7)
Eosinophils Relative: 1 % (ref 0–5)
HCT: 39.5 % (ref 36.0–46.0)
Hemoglobin: 13.3 g/dL (ref 12.0–15.0)
Lymphocytes Relative: 40 % (ref 12–46)
MCHC: 33.7 g/dL (ref 30.0–36.0)
MCV: 91.9 fL (ref 78.0–100.0)
Monocytes Absolute: 0.9 10*3/uL (ref 0.1–1.0)
RDW: 13.6 % (ref 11.5–15.5)

## 2010-02-16 NOTE — Progress Notes (Signed)
       New/Updated Medications: FLONASE 50 MCG/ACT SUSP (FLUTICASONE PROPIONATE) 2 spray/ nostril once daily. Prescriptions: FLONASE 50 MCG/ACT SUSP (FLUTICASONE PROPIONATE) 2 spray/ nostril once daily.  #1 x 2   Entered by:   Payton Spark CMA   Authorized by:   Seymour Bars DO   Signed by:   Payton Spark CMA on 02/08/2010   Method used:   Electronically to        [facility] [phone]* (retail)       [address]       [place], [place]  [address]       Ph: [phone] or [phone]       Fax: [phone]   RxID:   [id number]
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
## 2010-03-01 ENCOUNTER — Encounter: Payer: Medicare Other | Admitting: Obstetrics & Gynecology

## 2010-03-06 ENCOUNTER — Ambulatory Visit: Payer: Medicare Other | Admitting: Family Medicine

## 2010-03-07 ENCOUNTER — Encounter: Payer: Medicare Other | Admitting: Obstetrics & Gynecology

## 2010-03-07 ENCOUNTER — Other Ambulatory Visit (HOSPITAL_COMMUNITY)
Admission: RE | Admit: 2010-03-07 | Discharge: 2010-03-07 | Disposition: A | Discharge status: Home / Self Care | Payer: Medicare Other | Source: Ambulatory Visit | Attending: Obstetrics & Gynecology | Admitting: Obstetrics & Gynecology

## 2010-03-07 ENCOUNTER — Other Ambulatory Visit: Payer: Self-pay | Admitting: Obstetrics & Gynecology

## 2010-03-07 DIAGNOSIS — Z01419 Encounter for gynecological examination (general) (routine) without abnormal findings: Secondary | ICD-10-CM

## 2010-03-07 DIAGNOSIS — Z124 Encounter for screening for malignant neoplasm of cervix: Secondary | ICD-10-CM | POA: Insufficient documentation

## 2010-03-13 ENCOUNTER — Encounter: Payer: Self-pay | Admitting: Family Medicine

## 2010-03-13 ENCOUNTER — Ambulatory Visit (INDEPENDENT_AMBULATORY_CARE_PROVIDER_SITE_OTHER): Payer: Medicare Other | Admitting: Family Medicine

## 2010-03-13 DIAGNOSIS — J984 Other disorders of lung: Secondary | ICD-10-CM | POA: Insufficient documentation

## 2010-03-13 DIAGNOSIS — R61 Generalized hyperhidrosis: Secondary | ICD-10-CM

## 2010-03-15 ENCOUNTER — Other Ambulatory Visit: Payer: Self-pay | Admitting: Obstetrics & Gynecology

## 2010-03-15 ENCOUNTER — Encounter: Payer: Medicare Other | Admitting: Obstetrics & Gynecology

## 2010-03-15 DIAGNOSIS — R87612 Low grade squamous intraepithelial lesion on cytologic smear of cervix (LGSIL): Secondary | ICD-10-CM

## 2010-03-16 LAB — CBC
HCT: 39.6 % (ref 36.0–46.0)
MCV: 93 fL (ref 78.0–100.0)
RBC: 4.26 MIL/uL (ref 3.87–5.11)
WBC: 9.4 10*3/uL (ref 4.0–10.5)

## 2010-03-16 LAB — POCT CARDIAC MARKERS: Troponin i, poc: <0.05 ng/mL (ref 0.00–0.09)

## 2010-03-16 LAB — BASIC METABOLIC PANEL
BUN: 12 mg/dL (ref 6–23)
Chloride: 106 mEq/L (ref 96–112)
GFR calc Af Amer: >60 mL/min (ref >60)
Potassium: 4.2 mEq/L (ref 3.5–5.1)

## 2010-03-16 LAB — DIFFERENTIAL
Eosinophils Relative: 1 % (ref 0–5)
Lymphocytes Relative: 33 % (ref 12–46)
Lymphs Abs: 3.1 10*3/uL (ref 0.7–4.0)
Monocytes Absolute: 0.8 10*3/uL (ref 0.1–1.0)
Monocytes Relative: 9 % (ref 3–12)

## 2010-03-16 LAB — URINALYSIS, ROUTINE W REFLEX MICROSCOPIC
Glucose, UA: NEGATIVE mg/dL
Ketones, ur: NEGATIVE mg/dL
pH: 6.5 (ref 5.0–8.0)
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
## 2010-03-29 ENCOUNTER — Ambulatory Visit: Payer: Medicare Other | Admitting: Obstetrics & Gynecology

## 2010-04-05 ENCOUNTER — Ambulatory Visit: Payer: Medicare Other | Admitting: Obstetrics & Gynecology

## 2010-04-19 ENCOUNTER — Encounter: Payer: Self-pay | Admitting: Critical Care Medicine

## 2010-04-20 ENCOUNTER — Encounter: Payer: Self-pay | Admitting: Critical Care Medicine

## 2010-04-20 ENCOUNTER — Ambulatory Visit (INDEPENDENT_AMBULATORY_CARE_PROVIDER_SITE_OTHER): Payer: Medicare Other | Admitting: Critical Care Medicine

## 2010-04-20 VITALS — BP 110/80 | HR 84 | Temp 98.5°F | Ht 64.5 in | Wt 127.0 lb

## 2010-04-20 DIAGNOSIS — F17200 Nicotine dependence, unspecified, uncomplicated: Secondary | ICD-10-CM

## 2010-04-20 DIAGNOSIS — J301 Allergic rhinitis due to pollen: Secondary | ICD-10-CM | POA: Insufficient documentation

## 2010-04-20 DIAGNOSIS — J209 Acute bronchitis, unspecified: Secondary | ICD-10-CM

## 2010-04-20 MED ORDER — NICOTINE 10 MG IN INHA: RESPIRATORY_TRACT | Status: DC

## 2010-04-20 MED ORDER — MOMETASONE FUROATE 220 MCG/INH IN AEPB: 2.0000 | INHALATION_SPRAY | Freq: Every day | RESPIRATORY_TRACT | Status: DC

## 2010-04-20 MED ORDER — FLUTICASONE FUROATE 27.5 MCG/SPRAY NA SUSP: 2.0000 | Freq: Every day | NASAL | Status: DC

## 2010-04-20 NOTE — Assessment & Plan Note (Addendum)
Need to focus on smoking cessation Chronic tracheobronchitis   use nicotrol for smoking cessation Start Asmanex two puff daily Start Veramyst two puff each nostril daily Return 2 months [place]

## 2010-04-20 NOTE — Patient Instructions (Signed)
Stop smoking , use nicotrol Start Asmanex two puff daily Start Veramyst two puff each nostril daily Return 2 months [place]

## 2010-04-20 NOTE — Progress Notes (Signed)
Subjective:    Patient ID: Mary [name], female    DOB: September 19, 1965, 45 y.o.   MRN: [id number]  HPI 45 y.o. WF referred for lung nodule/lingular infiltrate  Abn xray. ? If there before.  There years ago in [place].  Has freq bouts of PNA.  Has had 4 spells in three years.  Xray and CT done. When has PNA ? Flu like illness.  No cough with the last bout.  Has chronic back issues.  UTI symptoms. No chronic cough .  Since last PNA, has chronic pndrip and cannot get rid of this.  Has hoarseness.   Has to clear the throat a lot. When awakens in the am:  Eyes are running, blows nose 5-6x.  No real sneezing.  Notes some DOE .  Does take high level of morphine .   Never used nasal drugs.  No afrin use.  No nasal steroid.  No antihistamine use .  Tested for allergies when :  Dust, grass, ? Used immunotherapy: 3 mo later was ok and stopped after this.  Middleschooler .   Past Medical History  Diagnosis Date  . Nephrolithiasis   . MVP (mitral valve prolapse)   . SVT (supraventricular tachycardia)   . PAF (paroxysmal atrial fibrillation)   . Herniated disc      Family History  Problem Relation Age of Onset  . Stomach cancer Paternal Aunt   . Stomach cancer Father   . Coronary artery disease Father   . Hypertension Father      History   Social History  . Marital Status: Widowed    Spouse Name: N/A    Number of Children: N/A  . Years of Education: N/A   Occupational History  . Not on file.   Social History Main Topics  . Smoking status: Current Everyday Smoker -- 1.0 packs/day    Types: Cigarettes  . Smokeless tobacco: Never Used   Comment: started smoking at age 73  . Alcohol Use: Yes     rarely  . Drug Use: No  . Sexually Active: Not on file   Other Topics Concern  . Not on file   Social History Narrative  . No narrative on file     Allergies  Allergen Reactions  . Albuterol     REACTION: shakey, tachycardic  . Atenolol   . Benzoyl Peroxide   . Codeine   .  Cyclobenzaprine Hcl   . Diphenhydramine Hcl   . Erythromycin   . Sulfonamide Derivatives      Outpatient Prescriptions Prior to Visit  Medication Sig Dispense Refill  . amphetamine-dextroamphetamine (ADDERALL) 10 MG tablet Take 5 in am and 3 by mouth at noon      . baclofen (LIORESAL) 20 MG tablet Take 20 mg by mouth 3 (three) times daily as needed.        . clonazePAM (KLONOPIN) 1 MG tablet Take 1/2 tablet two times a day and 2 tablets by mouth at bedtime       . hyoscyamine (LEVSIN SL) 0.125 MG SL tablet Place 0.125 mg under the tongue every 4 (four) hours as needed.        . lansoprazole (PREVACID) 30 MG capsule Take 30 mg by mouth daily before breakfast.        . lubiprostone (AMITIZA) 24 MCG capsule Take 24 mcg by mouth 2 (two) times daily with a meal.        . methocarbamol (ROBAXIN) 750 MG tablet Take 1-2 tablets by  mouth at bedtime as needed       . modafinil (PROVIGIL) 200 MG tablet Take 1 1/2 tablets two times a day       . morphine (MS CONTIN) 30 MG 12 hr tablet Take 30 mg by mouth 3 (three) times daily.       Marland Kitchen morphine (MSIR) 30 MG tablet Take 30 mg by mouth 4 (four) times daily.       . Multiple Vitamin (MULTIVITAMIN) capsule Take 1 capsule by mouth daily.        Marland Kitchen OLANZapine (ZYPREXA) 5 MG tablet Take 5 mg by mouth 2 (two) times daily as needed.        . promethazine (PHENERGAN) 25 MG tablet Take 25 mg by mouth every 4 (four) hours as needed.       . topiramate (TOPAMAX) 100 MG tablet Take 3 at bedtime       . Vilazodone HCl (VIIBRYD) 40 MG TABS Take 1.5 tablets by mouth daily before breakfast.       . zolpidem (AMBIEN) 10 MG tablet Take 1/2 to 1 1/2 at bedtime as needed       . fluticasone (FLONASE) 50 MCG/ACT nasal spray 2 sprays by Nasal route daily.        Marland Kitchen L-Methylfolate (DEPLIN) 15 MG TABS Take 1 tablet by mouth daily after breakfast.           Review of Systems  Constitutional: Positive for fever, diaphoresis and fatigue. Negative for chills, activity change,  appetite change and unexpected weight change.  HENT: Positive for congestion, rhinorrhea, voice change and postnasal drip. Negative for hearing loss, ear pain, nosebleeds, sore throat, facial swelling, sneezing, mouth sores, trouble swallowing, neck pain, neck stiffness, dental problem, sinus pressure, tinnitus and ear discharge.   Eyes: Negative for photophobia, discharge, itching and visual disturbance.  Respiratory: Positive for shortness of breath and wheezing. Negative for apnea, cough, choking, chest tightness and stridor.   Cardiovascular: Negative for chest pain, palpitations and leg swelling.  Gastrointestinal: Positive for abdominal pain and abdominal distention. Negative for nausea, vomiting, constipation and blood in stool.  Genitourinary: Negative for dysuria, urgency, frequency, hematuria, flank pain, decreased urine volume and difficulty urinating.  Musculoskeletal: Positive for myalgias, back pain, arthralgias and gait problem. Negative for joint swelling.  Skin: Negative for color change, pallor and rash.  Neurological: Positive for dizziness, weakness, light-headedness and headaches. Negative for tremors, seizures, syncope, speech difficulty and numbness.  Hematological: Negative for adenopathy. Does not bruise/bleed easily.  Psychiatric/Behavioral: Positive for confusion, sleep disturbance and agitation. The patient is nervous/anxious.        Objective:   Physical Exam Gen: Pleasant, well-nourished, in no distress,  normal affect  ENT: No lesions,  mouth clear,  oropharynx clear, no postnasal drip  Neck: No JVD, no TMG, no carotid bruits  Lungs: No use of accessory muscles, no dullness to percussion, L>R wheeze,  Scattered rhonchi Cardiovascular: RRR, heart sounds normal, no murmur or gallops, no peripheral edema  Abdomen: soft and NT, no HSM,  BS normal  Musculoskeletal: No deformities, no cyanosis or clubbing  Neuro: alert, non focal  Skin: Warm, no lesions or  rashes        Assessment & Plan:   Obstructive chronic bronchitis without exacerbation Need to focus on smoking cessation Chronic tracheobronchitis   use nicotrol for smoking cessation Start Asmanex two puff daily Start Veramyst two puff each nostril daily Return 2 months [place]     Updated Medication  List Outpatient Encounter Prescriptions as of 04/20/2010  Medication Sig Dispense Refill  . amphetamine-dextroamphetamine (ADDERALL) 10 MG tablet Take 5 in am and 3 by mouth at noon      . baclofen (LIORESAL) 20 MG tablet Take 20 mg by mouth 3 (three) times daily as needed.        . clonazePAM (KLONOPIN) 1 MG tablet Take 1/2 tablet two times a day and 2 tablets by mouth at bedtime       . hyoscyamine (LEVSIN SL) 0.125 MG SL tablet Place 0.125 mg under the tongue every 4 (four) hours as needed.        . lansoprazole (PREVACID) 30 MG capsule Take 30 mg by mouth daily before breakfast.        . LORazepam (ATIVAN) 0.5 MG tablet Take 0.5 mg by mouth 3 (three) times daily as needed.        . lubiprostone (AMITIZA) 24 MCG capsule Take 24 mcg by mouth 2 (two) times daily with a meal.        . methocarbamol (ROBAXIN) 750 MG tablet Take 1-2 tablets by mouth at bedtime as needed       . modafinil (PROVIGIL) 200 MG tablet Take 1 1/2 tablets two times a day       . morphine (MS CONTIN) 30 MG 12 hr tablet Take 30 mg by mouth 3 (three) times daily.       Marland Kitchen morphine (MSIR) 30 MG tablet Take 30 mg by mouth 4 (four) times daily.       . Multiple Vitamin (MULTIVITAMIN) capsule Take 1 capsule by mouth daily.        Marland Kitchen OLANZapine (ZYPREXA) 5 MG tablet Take 5 mg by mouth 2 (two) times daily as needed.        . promethazine (PHENERGAN) 25 MG tablet Take 25 mg by mouth every 4 (four) hours as needed.       . topiramate (TOPAMAX) 100 MG tablet Take 3 at bedtime       . Vilazodone HCl (VIIBRYD) 40 MG TABS Take 1.5 tablets by mouth daily before breakfast.       . zolpidem (AMBIEN) 10 MG tablet Take 1/2 to  1 1/2 at bedtime as needed       . fluticasone (VERAMYST) 27.5 MCG/SPRAY nasal spray 2 sprays by Nasal route daily.  10 g  6  . mometasone (ASMANEX 120 METERED DOSES) 220 MCG/INH inhaler Inhale 2 puffs into the lungs daily.  1 Inhaler  6  . nicotine (NICOTROL) 10 MG inhaler Use 60-80 puffs per cartridge,  Use 6-8 cartridges per day   168 each  3  . DISCONTD: fluticasone (FLONASE) 50 MCG/ACT nasal spray 2 sprays by Nasal route daily.        Marland Kitchen DISCONTD: L-Methylfolate (DEPLIN) 15 MG TABS Take 1 tablet by mouth daily after breakfast.        . DISCONTD: nicotine (NICOTROL) 10 MG inhaler Use 60-80 puffs per cartridge,  Use 6-8 cartridges per day   168 each  3

## 2010-04-22 ENCOUNTER — Other Ambulatory Visit: Payer: Self-pay | Admitting: Family Medicine

## 2010-04-23 ENCOUNTER — Encounter: Payer: Self-pay | Admitting: Critical Care Medicine

## 2010-04-24 ENCOUNTER — Telehealth: Payer: Self-pay | Admitting: *Deleted

## 2010-04-24 NOTE — Telephone Encounter (Signed)
Prior Auth was initiated through [employer] for nicotrol cartridge inhaler.  PA signed by PW and faxed back to [employer] at [phone].  Form placed back in triage with the awaiting approval/denial forms.
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
## 2010-05-16 NOTE — Telephone Encounter (Signed)
Approval letter received on April 24, 2010 for nicotrol cartridge for 04/03/10-04/24/11.  This letter has already been scanned into epic.  Katie with [facility] aware of approval.  LMOMTCB to inform pt of approval.

## 2010-05-16 NOTE — Assessment & Plan Note (Signed)
[facility]                         GASTROENTEROLOGY OFFICE NOTE   Mary, [name]                     MRN:          [id number]  DATE:11/19/2006                            DOB:          03-10-1965    Mary [name] is almost teary today complaining of a new GI difficulty.  She  says since early October she has had constant epigastric pain with  severe nausea requiring Phenergan use.  She has found that she has no  response whatsoever to a variety of different PPIs.  She currently is  taking omeprazole daily.  As per my previous note, she is on large doses  of narcotics, and other psychotropic medications for chronic back pain.  She also has multiple drug allergies.  She has a history of chronic  anxiety and depression, and chronic headaches.  All of this narcotic  dependency has obviously caused her severe obstipation.  She had some  response to Amitiza, but this exacerbated her nausea.   At the time of her colonoscopy on July 24, 2006, she had a large cecal  polyp that was removed.  She has scheduled followup exam next summer.  Endoscopic exam and upper abdomen was essentially unremarkable,  including CLO biopsy and duodenal biopsy.  Upper abdominal ultrasound  exam showed no evidence of cholelithiasis, or showed any other  abnormalities.   She saw Dr. Seymour Bars in late October, and apparently had a urinary  tract infection, and was treated with antibiotics.  At that time, her  white count was 14.3 with a slight left shift.  She is not on  antibiotics at this time, but is on a variety of medications, listed and  reviewed, which include daily Senokot, and a variety of narcotics.  Despite all of these complaints, she has had no anorexia or weight loss.  She relates that ever since her colonoscopy, I have not been able to  have a flat stomach.  She denies rectal bleeding or melena.  She  complains of a low-grade fever, night sweats, but has had not  skin  rashes, arthralgias, etc.   She weighs 115 pounds, which is up 8 pounds from her previous weight.  Blood pressure is 98/70.  I cannot appreciate stigmata of chronic liver disease.  Her abdomen is  flat and nontender without distention, organomegaly, masses, or true  tenderness.  Bowel sounds are normal.   ASSESSMENT:  I think Mary [name] probably has functional dyspepsia with  underlying gastrointestinal motility disorder exacerbated by narcotic  use.  I am doubtful that we will find any organic pathology to help her  symptomatology.  I do not think she is a candidate for Reglan therapy  per her other multiple medications and psychotropic drugs.   RECOMMENDATIONS:  1. Repeat CBC and lab data, and celiac panel.  2. Abdominal/pelvic CT scan.  3. Trial of Carafate suspension 1 gm after meals, and at bedtime.  4. Consider referral to a pain clinic for management of her narcotics      and her psychotropics.  As mentioned above, I doubt we will find  much from a GI standpoint at this time, since reviewing her chart.      It seems that these problems have been present for a long period of      time per Dr. Ovidio Kin notes.  5. Patient will need followup colonoscopy in 1 year's time.  She did      see our geneticist at [facility], who thinks that she may have a      variation of lynch syndrome.  Mary [name] relates she gets regular      gynecologic examinations.  She cannot get genetic testing because      of her insurance status.     Vania Rea. Jarold Motto, MD, Caleen Essex, FAGA  Electronically Signed    DRP/MedQ  DD: 11/19/2006  DT: 11/20/2006  Job #: [id number]   cc:   Seymour Bars, D.O.

## 2010-05-17 NOTE — Telephone Encounter (Signed)
Called, spoke with pt.  She is aware nicotrol inhaler has been approved and [facility] is aware of this.  Pt states she received message stating her appt on 06/22/10 was cancelled bc PW will be out of office that day and to call back to schedule f/u -  OV scheduled for 06/29/10 at 1:30 -- pt aware and ok with this.

## 2010-06-14 ENCOUNTER — Ambulatory Visit
Admission: RE | Admit: 2010-06-14 | Discharge: 2010-06-14 | Disposition: A | Discharge status: Home / Self Care | Payer: Medicare Other | Source: Ambulatory Visit | Attending: Family Medicine | Admitting: Family Medicine

## 2010-06-14 ENCOUNTER — Encounter: Payer: Self-pay | Admitting: Family Medicine

## 2010-06-14 ENCOUNTER — Telehealth: Payer: Self-pay | Admitting: Family Medicine

## 2010-06-14 ENCOUNTER — Ambulatory Visit (INDEPENDENT_AMBULATORY_CARE_PROVIDER_SITE_OTHER): Payer: Medicare Other | Admitting: Family Medicine

## 2010-06-14 VITALS — BP 114/74 | HR 90 | Ht 64.5 in | Wt 128.0 lb

## 2010-06-14 DIAGNOSIS — M79672 Pain in left foot: Secondary | ICD-10-CM

## 2010-06-14 DIAGNOSIS — M79671 Pain in right foot: Secondary | ICD-10-CM

## 2010-06-14 DIAGNOSIS — M79609 Pain in unspecified limb: Secondary | ICD-10-CM

## 2010-06-14 NOTE — Patient Instructions (Signed)
Xray both feet today. Start wearing CAM walker on the L side.  ACE Wrap the R foot/ ankle and use ice and Advil for pain and inflammation.  Will all you w/ xray results this afternoon. Will refer you to ortho if needed.

## 2010-06-14 NOTE — Telephone Encounter (Signed)
Pls call pt (call her mom's cell phone #) Her foot xrays are both negative for fracture.    Since she has so much pain and swelling, I do want her to wear the CAM walker on the L foot for the next 2 wks during the day and ACE wrap the R foot.  Use Advil 3 tabs with breakfast lunch and dinner for the next 10 days for pain and inflammation.  Ice packs 15 min 3-4 x a day to reduce swelling.  If pain/ swelling not improved in 2 wks, please call me and I'll set her up with ortho.
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
## 2010-06-14 NOTE — Telephone Encounter (Signed)
Pt aware of the above  

## 2010-06-22 ENCOUNTER — Ambulatory Visit: Payer: Medicare Other | Admitting: Critical Care Medicine

## 2010-06-29 ENCOUNTER — Ambulatory Visit: Payer: Medicare Other | Admitting: Critical Care Medicine

## 2010-07-27 ENCOUNTER — Ambulatory Visit (INDEPENDENT_AMBULATORY_CARE_PROVIDER_SITE_OTHER): Payer: Medicare Other | Admitting: Critical Care Medicine

## 2010-07-27 ENCOUNTER — Encounter: Payer: Self-pay | Admitting: Critical Care Medicine

## 2010-07-27 VITALS — BP 110/80 | HR 96 | Temp 98.5°F | Ht 64.0 in | Wt 125.5 lb

## 2010-07-27 DIAGNOSIS — J189 Pneumonia, unspecified organism: Secondary | ICD-10-CM

## 2010-07-27 DIAGNOSIS — J984 Other disorders of lung: Secondary | ICD-10-CM

## 2010-07-27 DIAGNOSIS — J449 Chronic obstructive pulmonary disease, unspecified: Secondary | ICD-10-CM

## 2010-07-27 NOTE — Progress Notes (Signed)
Subjective:    Patient ID: Mary [name], female    DOB: 11-18-1965, 45 y.o.   MRN: [id number]  HPI  45 y.o. WF referred for lung nodule/lingular infiltrate  Abn xray. ? If there before.  There years ago in [place].  Has freq bouts of PNA.  Has had 4 spells in three years.  Xray and CT done. When has PNA ? Flu like illness.  No cough with the last bout.  Has chronic back issues.  UTI symptoms. No chronic cough .  Since last PNA, has chronic pndrip and cannot get rid of this.  Has hoarseness.   Has to clear the throat a lot. When awakens in the am:  Eyes are running, blows nose 5-6x.  No real sneezing.  Notes some DOE .  Does take high level of morphine .   Never used nasal drugs.  No afrin use.  No nasal steroid.  No antihistamine use .  Tested for allergies when :  Dust, grass, ? Used immunotherapy: 3 mo later was ok and stopped after this.  Middleschooler .  7/26 At last ov we started asmanex. Pt now is better. There is less cough.  There is less dyspnea. Pt still smoking. Pt denies any significant sore throat, nasal congestion or excess secretions, fever, chills, sweats, unintended weight loss, pleurtic or exertional chest pain, orthopnea PND, or leg swelling Pt denies any increase in rescue therapy over baseline, denies waking up needing it or having any early am or nocturnal exacerbations of coughing/wheezing/or dyspnea. Pt also denies any obvious fluctuation in symptoms with  weather or environmental change or other alleviating or aggravating factors    Past Medical History  Diagnosis Date  . Nephrolithiasis   . MVP (mitral valve prolapse)   . SVT (supraventricular tachycardia)   . PAF (paroxysmal atrial fibrillation)   . Herniated disc      Family History  Problem Relation Age of Onset  . Stomach cancer Paternal Aunt   . Stomach cancer Father   . Coronary artery disease Father   . Hypertension Father      History   Social History  . Marital Status: Widowed   Spouse Name: N/A    Number of Children: N/A  . Years of Education: N/A   Occupational History  . Not on file.   Social History Main Topics  . Smoking status: Current Everyday Smoker -- 0.8 packs/day    Types: Cigarettes  . Smokeless tobacco: Never Used   Comment: started smoking at age 89  . Alcohol Use: Yes     rarely  . Drug Use: No  . Sexually Active: Not on file   Other Topics Concern  . Not on file   Social History Narrative  . No narrative on file     Allergies  Allergen Reactions  . Albuterol     REACTION: shakey, tachycardic  . Atenolol   . Benzoyl Peroxide     topical  . Codeine   . Cyclobenzaprine Hcl   . Diphenhydramine Hcl   . Erythromycin   . Sulfonamide Derivatives      Outpatient Prescriptions Prior to Visit  Medication Sig Dispense Refill  . amphetamine-dextroamphetamine (ADDERALL) 10 MG tablet Take 5 in am and 3 by mouth at noon      . baclofen (LIORESAL) 20 MG tablet Take 20 mg by mouth 3 (three) times daily as needed.        . clonazePAM (KLONOPIN) 1 MG tablet Take 1/2 tablet  two times a day and 2 tablets by mouth at bedtime as needed      . fluticasone (VERAMYST) 27.5 MCG/SPRAY nasal spray 2 sprays by Nasal route daily.  10 g  6  . lansoprazole (PREVACID) 30 MG capsule Take 30 mg by mouth daily before breakfast.        . LORazepam (ATIVAN) 0.5 MG tablet Take 0.5 mg by mouth 3 (three) times daily as needed.        . methocarbamol (ROBAXIN) 750 MG tablet Take 1-2 tablets by mouth at bedtime as needed       . modafinil (PROVIGIL) 200 MG tablet Take 400 mg by mouth 2 (two) times daily.       . mometasone (ASMANEX 120 METERED DOSES) 220 MCG/INH inhaler Inhale 2 puffs into the lungs daily.  1 Inhaler  6  . morphine (MS CONTIN) 30 MG 12 hr tablet Take 30 mg by mouth 3 (three) times daily.       Marland Kitchen morphine (MSIR) 30 MG tablet Take 30 mg by mouth 4 (four) times daily.       . Multiple Vitamin (MULTIVITAMIN) capsule Take 1 capsule by mouth daily.        Marland Kitchen  perphenazine (TRILAFON) 2 MG tablet Take 2 mg by mouth at bedtime.       . promethazine (PHENERGAN) 25 MG tablet Take 25 mg by mouth every 4 (four) hours as needed.       . topiramate (TOPAMAX) 100 MG tablet Take 3 at bedtime       . Vilazodone HCl (VIIBRYD) 40 MG TABS Take 1.5 tablets by mouth daily before breakfast.       . zolpidem (AMBIEN) 10 MG tablet Take 1/2 to 1 1/2 at bedtime as needed       . AMITIZA 24 MCG capsule TAKE ONE CAPSULE TWICE A DAY  60 capsule  3  . nicotine (NICOTROL) 10 MG inhaler Use 60-80 puffs per cartridge,  Use 6-8 cartridges per day   168 each  3  . NUCYNTA 75 MG TABS       . OLANZapine (ZYPREXA) 5 MG tablet Take 5 mg by mouth 2 (two) times daily as needed.        . hyoscyamine (LEVSIN SL) 0.125 MG SL tablet Place 0.125 mg under the tongue every 4 (four) hours as needed.        . prazosin (MINIPRESS) 1 MG capsule          Review of Systems  Constitutional: Positive for fever, diaphoresis and fatigue. Negative for chills, activity change, appetite change and unexpected weight change.  HENT: Positive for congestion, rhinorrhea, voice change and postnasal drip. Negative for hearing loss, ear pain, nosebleeds, sore throat, facial swelling, sneezing, mouth sores, trouble swallowing, neck pain, neck stiffness, dental problem, sinus pressure, tinnitus and ear discharge.   Eyes: Negative for photophobia, discharge, itching and visual disturbance.  Respiratory: Positive for shortness of breath and wheezing. Negative for apnea, cough, choking, chest tightness and stridor.   Cardiovascular: Negative for chest pain, palpitations and leg swelling.  Gastrointestinal: Positive for abdominal pain and abdominal distention. Negative for nausea, vomiting, constipation and blood in stool.  Genitourinary: Negative for dysuria, urgency, frequency, hematuria, flank pain, decreased urine volume and difficulty urinating.  Musculoskeletal: Positive for myalgias, back pain, arthralgias and  gait problem. Negative for joint swelling.  Skin: Negative for color change, pallor and rash.  Neurological: Positive for dizziness, weakness, light-headedness and headaches. Negative for tremors,  seizures, syncope, speech difficulty and numbness.  Hematological: Negative for adenopathy. Does not bruise/bleed easily.  Psychiatric/Behavioral: Positive for confusion, sleep disturbance and agitation. The patient is nervous/anxious.        Objective:   Physical Exam  Filed Vitals:   07/27/10 1556  BP: 110/80  Pulse: 96  Temp: 98.5 F (36.9 C)  TempSrc: Oral  Height: 5\' 4"  (1.626 m)  Weight: 125 lb 8 oz (56.926 kg)  SpO2: 100%    Gen: Pleasant, well-nourished, in no distress,  normal affect  ENT: No lesions,  mouth clear,  oropharynx clear, no postnasal drip  Neck: No JVD, no TMG, no carotid bruits  Lungs: No use of accessory muscles, no dullness to percussion, distant BS  Cardiovascular: RRR, heart sounds normal, no murmur or gallops, no peripheral edema  Abdomen: soft and NT, no HSM,  BS normal  Musculoskeletal: No deformities, no cyanosis or clubbing  Neuro: alert, non focal  Skin: Warm, no lesions or rashes    CT Chest 2/12 IMPRESSION:  1. No explanation for night sweats or hot flashes. No evidence of  pneumonia.  2. Reticular nodular opacity within the lingula. Favored to be  related to remote infection or inflammation, minimal. This does  measure 6 mm on coronal reformatted images  CT 07/28/10 IMPRESSION: 1. Since 02/07/2010, resolution previous slight reticular likely inflammatory lingular minimal pneumonitis. 2. Stable slight biapical pleural parenchymal scarring. 3. Stable slight residual thymic tissue. 4. Otherwise, negative.     Assessment & Plan:   PULMONARY NODULE Prior L lung Nodule /infiltrate Plan Resolved on CT 7/26  Obstructive chronic bronchitis without exacerbation Tobacco abuse and airflow obstruction on PFTs Chronic infiltrate L  lingula    Improved airflow on asmanex Resolved infiltrate L lung     Updated Medication List Outpatient Encounter Prescriptions as of 07/27/2010  Medication Sig Dispense Refill  . amphetamine-dextroamphetamine (ADDERALL) 10 MG tablet Take 5 in am and 3 by mouth at noon      . baclofen (LIORESAL) 20 MG tablet Take 20 mg by mouth 3 (three) times daily as needed.        . clonazePAM (KLONOPIN) 1 MG tablet Take 1/2 tablet two times a day and 2 tablets by mouth at bedtime as needed      . fluticasone (VERAMYST) 27.5 MCG/SPRAY nasal spray 2 sprays by Nasal route daily.  10 g  6  . lansoprazole (PREVACID) 30 MG capsule Take 30 mg by mouth daily before breakfast.        . LORazepam (ATIVAN) 0.5 MG tablet Take 0.5 mg by mouth 3 (three) times daily as needed.        . lubiprostone (AMITIZA) 24 MCG capsule 24 mcg daily.       . methocarbamol (ROBAXIN) 750 MG tablet Take 1-2 tablets by mouth at bedtime as needed       . modafinil (PROVIGIL) 200 MG tablet Take 400 mg by mouth 2 (two) times daily.       . mometasone (ASMANEX 120 METERED DOSES) 220 MCG/INH inhaler Inhale 2 puffs into the lungs daily.  1 Inhaler  6  . morphine (MS CONTIN) 30 MG 12 hr tablet Take 30 mg by mouth 3 (three) times daily.       Marland Kitchen morphine (MSIR) 30 MG tablet Take 30 mg by mouth 4 (four) times daily.       . Multiple Vitamin (MULTIVITAMIN) capsule Take 1 capsule by mouth daily.        Marland Kitchen  perphenazine (TRILAFON) 2 MG tablet Take 2 mg by mouth at bedtime.       . promethazine (PHENERGAN) 25 MG tablet Take 25 mg by mouth every 4 (four) hours as needed.       . topiramate (TOPAMAX) 100 MG tablet Take 3 at bedtime       . Vilazodone HCl (VIIBRYD) 40 MG TABS Take 1.5 tablets by mouth daily before breakfast.       . zolpidem (AMBIEN) 10 MG tablet Take 1/2 to 1 1/2 at bedtime as needed       . DISCONTD: AMITIZA 24 MCG capsule TAKE ONE CAPSULE TWICE A DAY  60 capsule  3  . nicotine (NICOTROL) 10 MG inhaler Use 60-80 puffs per  cartridge,  Use 6-8 cartridges per day   168 each  3  . NUCYNTA 75 MG TABS       . OLANZapine (ZYPREXA) 5 MG tablet Take 5 mg by mouth 2 (two) times daily as needed.        Marland Kitchen DISCONTD: hyoscyamine (LEVSIN SL) 0.125 MG SL tablet Place 0.125 mg under the tongue every 4 (four) hours as needed.        Marland Kitchen DISCONTD: prazosin (MINIPRESS) 1 MG capsule

## 2010-07-27 NOTE — Patient Instructions (Signed)
Work on smoking cessation Stay on asmanex Return 4 months Obtain Ct chest

## 2010-07-28 ENCOUNTER — Ambulatory Visit
Admission: RE | Admit: 2010-07-28 | Discharge: 2010-07-28 | Disposition: A | Discharge status: Home / Self Care | Payer: Medicare Other | Source: Ambulatory Visit | Attending: Critical Care Medicine | Admitting: Critical Care Medicine

## 2010-07-28 DIAGNOSIS — J189 Pneumonia, unspecified organism: Secondary | ICD-10-CM

## 2010-07-29 NOTE — Assessment & Plan Note (Signed)
Tobacco abuse and airflow obstruction on PFTs Chronic infiltrate L lingula    Improved airflow on asmanex Resolved infiltrate L lung

## 2010-07-29 NOTE — Assessment & Plan Note (Signed)
Prior L lung Nodule /infiltrate Plan Resolved on CT 7/26

## 2010-08-03 ENCOUNTER — Other Ambulatory Visit: Payer: Self-pay | Admitting: Gastroenterology

## 2010-10-03 LAB — CBC
HCT: 42.4
Hemoglobin: 14.4
MCV: 96.4
Platelets: 256
RDW: 12.4

## 2010-10-03 LAB — BASIC METABOLIC PANEL
BUN: 9
CO2: 22
Chloride: 112
Glucose, Bld: 84
Potassium: 3.5
Sodium: 143

## 2010-10-03 LAB — DIFFERENTIAL
Basophils Absolute: 0
Eosinophils Absolute: 0.1
Eosinophils Relative: 1
Lymphocytes Relative: 32
Monocytes Absolute: 0.9

## 2010-10-03 LAB — URINALYSIS, ROUTINE W REFLEX MICROSCOPIC
Bilirubin Urine: NEGATIVE
Glucose, UA: NEGATIVE
Hgb urine dipstick: NEGATIVE
Ketones, ur: NEGATIVE
Specific Gravity, Urine: 1.011
pH: 6.5

## 2010-11-09 ENCOUNTER — Ambulatory Visit (INDEPENDENT_AMBULATORY_CARE_PROVIDER_SITE_OTHER): Payer: Medicare Other | Admitting: Family Medicine

## 2010-11-09 VITALS — BP 117/82 | HR 92 | Wt 126.0 lb

## 2010-11-09 DIAGNOSIS — R140 Abdominal distension (gaseous): Secondary | ICD-10-CM

## 2010-11-09 DIAGNOSIS — I471 Supraventricular tachycardia: Secondary | ICD-10-CM

## 2010-11-09 DIAGNOSIS — R1909 Other intra-abdominal and pelvic swelling, mass and lump: Secondary | ICD-10-CM

## 2010-11-09 DIAGNOSIS — Z23 Encounter for immunization: Secondary | ICD-10-CM

## 2010-11-09 DIAGNOSIS — I498 Other specified cardiac arrhythmias: Secondary | ICD-10-CM

## 2010-11-09 DIAGNOSIS — R109 Unspecified abdominal pain: Secondary | ICD-10-CM

## 2010-11-09 DIAGNOSIS — R141 Gas pain: Secondary | ICD-10-CM

## 2010-11-09 DIAGNOSIS — R509 Fever, unspecified: Secondary | ICD-10-CM

## 2010-11-09 LAB — POCT URINALYSIS DIPSTICK
Bilirubin, UA: NEGATIVE
Nitrite, UA: NEGATIVE
Protein, UA: NEGATIVE
Urobilinogen, UA: 0.2
pH, UA: 5.5

## 2010-11-09 NOTE — Patient Instructions (Signed)
We will call you with your lab results. If you don't here from [place] in about a week then please give [place] a call at [phone]. We will schedule your cardiology appointment as well.

## 2010-11-09 NOTE — Progress Notes (Signed)
Subjective:    Patient ID: Mary [name], female    DOB: [birth year], 45 y.o.   MRN: [id number]  HPI Swelling in her feet and lower legs for about 6 mo.  Swells every day. somedays are worse. At night have nightsweats. Says getting occ fevers to 101.  It can happen 3-6 times a day.  That started about 6 mo ago as well.  Sometimes when have some severe pain will have occ vaginal bleeding.  Was bleeding so much last weekend that she felt light headed when she stood up. Though she ws going to pass out. Blood is bright red. No hx of anemia. No hx of thyroid problems.  Has been craving sweats.  Saw Dr. Marice Potter this summer but this was befoe she started bleeding. No chest pain or shortness of breath. She denies any dysuria or hematuria. She says normally she is constipated from her chronic pain medications but last week she did have diarrhea all week. She says this is very unusual for her. She says her bowels are back to normal this week.   Review of Systems     There were no vitals taken for this visit.    Allergies  Allergen Reactions  . Albuterol     REACTION: shakey, tachycardic  . Atenolol   . Benzoyl Peroxide     topical  . Codeine   . Cyclobenzaprine Hcl   . Diphenhydramine Hcl   . Erythromycin   . Sulfonamide Derivatives     Past Medical History  Diagnosis Date  . Nephrolithiasis   . MVP (mitral valve prolapse)   . SVT (supraventricular tachycardia)   . PAF (paroxysmal atrial fibrillation)   . Herniated disc     Past Surgical History  Procedure Date  . Tonsillectomy   . Urethra surgery   . Cardiac electrophysiology mapping and ablation     History   Social History  . Marital Status: Widowed    Spouse Name: N/A    Number of Children: N/A  . Years of Education: N/A   Occupational History  . Not on file.   Social History Main Topics  . Smoking status: Current Everyday Smoker -- 0.8 packs/day    Types: Cigarettes  . Smokeless tobacco: Never Used   Comment:  started smoking at age 73  . Alcohol Use: Yes     rarely  . Drug Use: No  . Sexually Active: Not on file   Other Topics Concern  . Not on file   Social History Narrative  . No narrative on file    Family History  Problem Relation Age of Onset  . Stomach cancer Paternal Aunt   . Stomach cancer Father   . Coronary artery disease Father   . Hypertension Father     Current outpatient prescriptions:amphetamine-dextroamphetamine (ADDERALL) 10 MG tablet, Take 5 in am and 3 by mouth at noon, Disp: , Rfl: ;  baclofen (LIORESAL) 20 MG tablet, Take 20 mg by mouth 3 (three) times daily as needed.  , Disp: , Rfl: ;  clonazePAM (KLONOPIN) 1 MG tablet, Take 1/2 tablet two times a day and 2 tablets by mouth at bedtime as needed, Disp: , Rfl:  fluticasone (VERAMYST) 27.5 MCG/SPRAY nasal spray, 2 sprays by Nasal route daily., Disp: 10 g, Rfl: 6;  lansoprazole (PREVACID) 30 MG capsule, Take one tablet by mouth... NEEDS OFFICE VISIT, Disp: 30 capsule, Rfl: 3;  LORazepam (ATIVAN) 0.5 MG tablet, Take 0.5 mg by mouth 3 (three) times daily as  needed.  , Disp: , Rfl: ;  methocarbamol (ROBAXIN) 750 MG tablet, Take 1-2 tablets by mouth at bedtime as needed , Disp: , Rfl:  modafinil (PROVIGIL) 200 MG tablet, Take 400 mg by mouth 2 (two) times daily. , Disp: , Rfl: ;  mometasone (ASMANEX 120 METERED DOSES) 220 MCG/INH inhaler, Inhale 2 puffs into the lungs daily., Disp: 1 Inhaler, Rfl: 6;  morphine (MS CONTIN) 30 MG 12 hr tablet, Take 30 mg by mouth 3 (three) times daily. , Disp: , Rfl: ;  morphine (MSIR) 30 MG tablet, Take 30 mg by mouth 4 (four) times daily. , Disp: , Rfl:  Multiple Vitamin (MULTIVITAMIN) capsule, Take 1 capsule by mouth daily.  , Disp: , Rfl: ;  OLANZapine (ZYPREXA) 5 MG tablet, Take 5 mg by mouth 2 (two) times daily as needed.  , Disp: , Rfl: ;  perphenazine (TRILAFON) 2 MG tablet, Take 2 mg by mouth at bedtime. , Disp: , Rfl: ;  promethazine (PHENERGAN) 25 MG tablet, Take 25 mg by mouth every 4  (four) hours as needed. , Disp: , Rfl:  topiramate (TOPAMAX) 100 MG tablet, Take 3 at bedtime , Disp: , Rfl: ;  Vilazodone HCl (VIIBRYD) 40 MG TABS, Take 1.5 tablets by mouth daily before breakfast. , Disp: , Rfl:     Objective:   Physical Exam  Constitutional: She is oriented to person, place, and time. She appears well-developed and well-nourished.  HENT:  Head: Normocephalic and atraumatic.  Eyes: Conjunctivae are normal. Pupils are equal, round, and reactive to light.  Neck: Neck supple. No thyromegaly present.  Cardiovascular: Normal rate, regular rhythm and normal heart sounds.   No murmur heard. Pulmonary/Chest: Effort normal and breath sounds normal. No respiratory distress.  Abdominal: Soft. Bowel sounds are normal. She exhibits no distension and no mass. There is no tenderness. There is no rebound and no guarding.  Musculoskeletal: She exhibits no edema.  Lymphadenopathy:    She has no cervical adenopathy.  Neurological: She is alert and oriented to person, place, and time.  Skin: Skin is warm and dry.  Psychiatric: She has a normal mood and affect. Her behavior is normal.          Assessment & Plan:  Lower extremity edema- unclear etiology at this point. She has no lower extremity edema on exam today. Certainly this could be related to her blood pressure or increased salt intake. I'm not sure why this started 6 months ago. She's also had some abdominal bloating and discomfort around the same time frame so I do want to make sure that she doesn't have any type of possible pelvic mass or enlarged uterus that could be causing some pain compression and making her lower extremity edema worse.  Fevers- unclear etiology. We will check a CBC today. Also because of her abdominal bloating, distention and discomfort I would like to check a CA 125.  Hot flashes- this is likely perimenopausal in nature. The certainly this could be vasovagal. She does have a complicated cardiac history.  It has been over 2 years and she saw a cardiologist. She would like to get established with [facility]. Last time she was seen was at [facility]. We will make a referral to medicine, cardiology. She prefers to go ahead and see the EP specialist initially because of her history of supraventricular tachycardia, and what sounds like a prior ablation.  Menorrhagia-check blood count today to make sure that she is not anemic.

## 2010-11-10 LAB — COMPLETE METABOLIC PANEL WITH GFR
AST: 16 U/L (ref 0–37)
Alkaline Phosphatase: 86 U/L (ref 39–117)
BUN: 10 mg/dL (ref 6–23)
Creat: 0.57 mg/dL (ref 0.50–1.10)
GFR, Est Non African American: >89 mL/min (ref >89)
Glucose, Bld: 78 mg/dL (ref 70–99)
Total Bilirubin: 0.4 mg/dL (ref 0.3–1.2)

## 2010-11-10 LAB — CBC WITH DIFFERENTIAL/PLATELET
Basophils Absolute: 0.1 10*3/uL (ref 0.0–0.1)
Basophils Relative: 1 % (ref 0–1)
Eosinophils Relative: 3 % (ref 0–5)
HCT: 41.2 % (ref 36.0–46.0)
Lymphocytes Relative: 46 % (ref 12–46)
MCHC: 32.8 g/dL (ref 30.0–36.0)
MCV: 95.2 fL (ref 78.0–100.0)
Monocytes Absolute: 0.7 10*3/uL (ref 0.1–1.0)
Neutro Abs: 3.4 10*3/uL (ref 1.7–7.7)
Platelets: 344 10*3/uL (ref 150–400)
RDW: 13.4 % (ref 11.5–15.5)
WBC: 8 10*3/uL (ref 4.0–10.5)

## 2010-11-10 LAB — LUTEINIZING HORMONE: LH: 3.7 m[IU]/mL

## 2010-11-10 LAB — FOLLICLE STIMULATING HORMONE: FSH: 25.1 m[IU]/mL

## 2010-11-10 LAB — IRON: Iron: 161 ug/dL — ABNORMAL HIGH (ref 42–145)

## 2010-11-10 LAB — CA 125: CA 125: 12.3 U/mL (ref 0.0–30.2)

## 2010-12-09 ENCOUNTER — Other Ambulatory Visit: Payer: Self-pay | Admitting: Gastroenterology

## 2011-01-24 ENCOUNTER — Encounter: Payer: Self-pay | Admitting: Obstetrics & Gynecology

## 2011-01-24 ENCOUNTER — Ambulatory Visit (INDEPENDENT_AMBULATORY_CARE_PROVIDER_SITE_OTHER): Payer: Medicare Other | Admitting: Obstetrics & Gynecology

## 2011-01-24 ENCOUNTER — Other Ambulatory Visit: Payer: Self-pay | Admitting: Obstetrics & Gynecology

## 2011-01-24 ENCOUNTER — Other Ambulatory Visit (HOSPITAL_COMMUNITY)
Admission: RE | Admit: 2011-01-24 | Discharge: 2011-01-24 | Disposition: A | Discharge status: Home / Self Care | Payer: Medicare Other | Source: Ambulatory Visit | Attending: Obstetrics & Gynecology | Admitting: Obstetrics & Gynecology

## 2011-01-24 DIAGNOSIS — N939 Abnormal uterine and vaginal bleeding, unspecified: Secondary | ICD-10-CM

## 2011-01-24 DIAGNOSIS — Z0000 Encounter for general adult medical examination without abnormal findings: Secondary | ICD-10-CM

## 2011-01-24 DIAGNOSIS — Z124 Encounter for screening for malignant neoplasm of cervix: Secondary | ICD-10-CM | POA: Insufficient documentation

## 2011-01-24 DIAGNOSIS — Z1159 Encounter for screening for other viral diseases: Secondary | ICD-10-CM | POA: Insufficient documentation

## 2011-01-24 DIAGNOSIS — Z1272 Encounter for screening for malignant neoplasm of vagina: Secondary | ICD-10-CM

## 2011-01-24 DIAGNOSIS — N870 Mild cervical dysplasia: Secondary | ICD-10-CM

## 2011-01-24 DIAGNOSIS — N83209 Unspecified ovarian cyst, unspecified side: Secondary | ICD-10-CM

## 2011-01-24 NOTE — Patient Instructions (Signed)
Perimenopause Perimenopause is the time when your body begins to move into the menopause (no menstrual period for 12 straight months). It is a natural process. Perimenopause can begin 2 to 8 years before the menopause and usually lasts for one year after the menopause. During this time, your ovaries may or may not produce an egg. The ovaries vary in their production of estrogen and progesterone hormones each month. This can cause irregular menstrual periods, difficulty in getting pregnant, vaginal bleeding between periods and uncomfortable symptoms. CAUSES  Irregular production of the ovarian hormones, estrogen and progesterone, and not ovulating every month.   Other causes include:   Tumor of the pituitary gland in the brain.   Medical disease that affects the ovaries.   Radiation treatment.   Chemotherapy.   Unknown causes.   Heavy smoking and excessive alcohol intake can bring on perimenopause sooner.  SYMPTOMS   Hot flashes.   Night sweats.   Irregular menstrual periods.   Decrease sex drive.   Vaginal dryness.   Headaches.   Mood swings.   Depression.   Memory problems.   Irritability.   Tiredness.   Weight gain.   Trouble getting pregnant.   The beginning of losing bone cells (osteoporosis).   The beginning of hardening of the arteries (atherosclerosis).  DIAGNOSIS  Your caregiver will make a diagnosis by analyzing your age, menstrual history and your symptoms. They will do a physical exam noting any changes in your body, especially your female organs. Female hormone tests may or may not be helpful depending on the amount and when you produce the female hormones. However, other hormone tests may be helpful (ex. thyroid hormone) to rule out other problems. TREATMENT  The decision to treat during the perimenopause should be made by you and your caregiver depending on how the symptoms are affecting you and your life style. There are various treatments available  such as:  Treating individual symptoms with a specific medication for that symptom (ex. tranquilizer for depression).   Herbal medications that can help specific symptoms.   Counseling.   Group therapy.   No treatment.  HOME CARE INSTRUCTIONS   Before seeing your caregiver, make a list of your menstrual periods (when the occur, how heavy they are, how long between periods and how long they last), your symptoms and when they started.   Take the medication as recommended by your caregiver.   Sleep and rest.   Exercise.   Eat a diet that contains calcium (good for your bones) and soy (acts like estrogen hormone).   Do not smoke.   Avoid alcoholic beverages.   Taking vitamin E may help in certain cases.   Take calcium and vitamin D supplements to help prevent bone loss.   Group therapy is sometimes helpful.   Acupuncture may help in some cases.  SEEK MEDICAL CARE IF:   You have any of the above and want to know if it is perimenopause.   You want advice and treatment for any of your symptoms mentioned above.   You need a referral to a specialist (gynecologist, psychiatrist or psychologist).  SEEK IMMEDIATE MEDICAL CARE IF:   You have vaginal bleeding.   Your period lasts longer than 8 days.   You periods are recurring sooner than 21 days.   You have bleeding after intercourse.   You have severe depression.   You have pain when you urinate.   You have severe headaches.   You develop vision problems.  Document   Released: 01/26/2004 Document Revised: 08/30/2010 Document Reviewed: 10/16/2007 [employer] Patient Information 2012 [employer]. 

## 2011-01-24 NOTE — Progress Notes (Signed)
  Subjective:    Patient ID: Mary [name], female    DOB: 12-03-1965, 46 y.o.   MRN: [id number]  HPI  46 yo female presents for menometrrohagia.  Pt states she had one point gone over 1 year without bleeding.  The past 2 months she had 6 episodes of bleeding about every 2 weeks.  Some very heavy where it "looks like something had been slaughtered."   She has some lightheadedness but she has a heart condition that causes this.  Pt also complaining of hot flashes and night sweats.  Pt is not sexually active.  Pt needs mammogram and also needs a re-pap for CIN -1 found in March 2012 by colpo.   Review of Systems  Respiratory: Positive for cough and shortness of breath.   Cardiovascular: Positive for palpitations.  Gastrointestinal: Negative.   Genitourinary: Positive for vaginal bleeding.  Neurological: Positive for light-headedness.       Objective:   Physical Exam  Constitutional: She appears well-developed and well-nourished. No distress.  HENT:  Head: Normocephalic and atraumatic.  Eyes: Conjunctivae are normal.  Abdominal: Soft. She exhibits no distension and no mass. There is no tenderness. There is no rebound and no guarding.  Genitourinary:       Vagina is pale pink.  Uterus anteverted and small.  No adnexal masses.  Cervix nml.  No blood in vault.  Mole on left mons that pt states has been there for many years.            Assessment & Plan:  46 yo female with:  1-heavy, irregular bleeding--Last FSH was 25 2 months ago.  Last CBC was nml, but this was before bleeding.  Will check CBC and FSH 2-TVUS to eval uterus / lining 3-mammogram ordered 4-pap smear done today. 5-RTC 2 weeks.

## 2011-01-25 LAB — FOLLICLE STIMULATING HORMONE: FSH: 9.3 m[IU]/mL

## 2011-01-25 LAB — CBC
Hemoglobin: 13.2 g/dL (ref 12.0–15.0)
MCH: 31.5 pg (ref 26.0–34.0)
MCHC: 32.5 g/dL (ref 30.0–36.0)
RDW: 14.2 % (ref 11.5–15.5)

## 2011-01-29 ENCOUNTER — Telehealth: Payer: Self-pay | Admitting: *Deleted

## 2011-01-29 NOTE — Telephone Encounter (Signed)
Pt called requesting to be put on hormones to help with night sweats and her irregular bleeding pattern.  She stated that Dr Marice Potter had recommended it last year but she did not want to go on something @ that point.  She is to go for an U/S and mammogram tomorrow and will discuss with Dr. Marice Potter after those results are available.

## 2011-01-30 ENCOUNTER — Inpatient Hospital Stay: Admission: RE | Admit: 2011-01-30 | Payer: Medicare Other | Source: Ambulatory Visit

## 2011-01-30 ENCOUNTER — Other Ambulatory Visit: Payer: Medicare Other

## 2011-02-06 ENCOUNTER — Ambulatory Visit
Admission: RE | Admit: 2011-02-06 | Discharge: 2011-02-06 | Disposition: A | Discharge status: Home / Self Care | Payer: Medicare Other | Source: Ambulatory Visit | Attending: Obstetrics & Gynecology | Admitting: Obstetrics & Gynecology

## 2011-02-06 DIAGNOSIS — N939 Abnormal uterine and vaginal bleeding, unspecified: Secondary | ICD-10-CM

## 2011-02-06 DIAGNOSIS — Z0000 Encounter for general adult medical examination without abnormal findings: Secondary | ICD-10-CM

## 2011-02-08 ENCOUNTER — Encounter: Payer: Self-pay | Admitting: *Deleted

## 2011-02-13 ENCOUNTER — Ambulatory Visit (INDEPENDENT_AMBULATORY_CARE_PROVIDER_SITE_OTHER): Payer: Medicare Other | Admitting: Gastroenterology

## 2011-02-13 ENCOUNTER — Encounter: Payer: Self-pay | Admitting: Gastroenterology

## 2011-02-13 DIAGNOSIS — Z860100 Personal history of colon polyps, unspecified: Secondary | ICD-10-CM

## 2011-02-13 DIAGNOSIS — K5900 Constipation, unspecified: Secondary | ICD-10-CM | POA: Insufficient documentation

## 2011-02-13 DIAGNOSIS — R109 Unspecified abdominal pain: Secondary | ICD-10-CM | POA: Insufficient documentation

## 2011-02-13 DIAGNOSIS — K219 Gastro-esophageal reflux disease without esophagitis: Secondary | ICD-10-CM

## 2011-02-13 MED ORDER — LANSOPRAZOLE 30 MG PO CPDR: DELAYED_RELEASE_CAPSULE | ORAL | Status: DC

## 2011-02-13 MED ORDER — LINACLOTIDE 290 MCG PO CAPS: 1.0000 | ORAL_CAPSULE | Freq: Every day | ORAL | Status: DC

## 2011-02-13 MED ORDER — PEG-KCL-NACL-NASULF-NA ASC-C 100 G PO SOLR: 1.0000 | Freq: Once | ORAL | Status: DC

## 2011-02-13 NOTE — Patient Instructions (Addendum)
Your procedure has been scheduled for 02/19/2011, please follow the seperate instructions.  Your prescription(s) have been sent to you pharmacy.  Take Linzess once a day on an empty stomach, samples and rx sent. Stop Miralax if you develop diarrhea.  Prevacid sent to your pharmacy

## 2011-02-13 NOTE — Progress Notes (Addendum)
History of Present Illness:  This is a 46 year old Caucasian female with chronic pain syndrome treated with chronic narcotics and anti-anxiety agents listed and reviewed in her record. Has a history of a previous large cecal polyp removed 4 years ago with followup colonoscopy 6 months later which was unremarkable. There is a family history of colon cancer, and she has seen our genetic advisor and feels that she has a mutation of standard Lynch Syndrome. Patient continued with abdominal gas, bloating, mild nausea, chronic functional constipation related to daily narcotic use and multiple other medications. She takes daily MiraLax and daily Prevacid 30 mg. The patient denies current acid reflux symptoms or dysphagia or any hepatobiliary complaints. Her appetite is good and she has not had weight loss or systemic complaints otherwise. She does have a history of multiple drug allergies.  I have reviewed this patient's present history, medical and surgical past history, allergies and medications.     ROS: The remainder of the 10 point ROS is negative   Allergies  Allergen Reactions  . Albuterol     REACTION: shakey, tachycardic  . Atenolol   . Benzoyl Peroxide     topical  . Codeine   . Cyclobenzaprine Hcl   . Diphenhydramine Hcl   . Erythromycin   . Sulfonamide Derivatives    Outpatient Prescriptions Prior to Visit  Medication Sig Dispense Refill  . amphetamine-dextroamphetamine (ADDERALL) 10 MG tablet Take 5 in am and 3 by mouth at noon      . baclofen (LIORESAL) 20 MG tablet Take 20 mg by mouth 3 (three) times daily as needed.        . clonazePAM (KLONOPIN) 1 MG tablet Take 1/2 tablet two times a day and 2 tablets by mouth at bedtime as needed      . LORazepam (ATIVAN) 0.5 MG tablet Take 0.5 mg by mouth 3 (three) times daily as needed.        . methocarbamol (ROBAXIN) 750 MG tablet Take 1-2 tablets by mouth at bedtime as needed       . modafinil (PROVIGIL) 200 MG tablet Take 200 mg by  mouth. Takes 1 and 1/2 tablets by mouth twice daily      . Multiple Vitamin (MULTIVITAMIN) capsule Take 1 capsule by mouth daily.        Marland Kitchen perphenazine (TRILAFON) 2 MG tablet Take 2 mg by mouth at bedtime.       . promethazine (PHENERGAN) 25 MG tablet Take 25 mg by mouth every 4 (four) hours as needed.       . Vilazodone HCl (VIIBRYD) 40 MG TABS 20 mg. Takes 60 mg 1 and 1 half tablet after breakfast      . lansoprazole (PREVACID) 30 MG capsule Take one tablet by mouth... NEEDS OFFICE VISIT  30 capsule  3  . fluticasone (VERAMYST) 27.5 MCG/SPRAY nasal spray 2 sprays by Nasal route daily.  10 g  6  . mometasone (ASMANEX 120 METERED DOSES) 220 MCG/INH inhaler Inhale 2 puffs into the lungs daily.  1 Inhaler  6  . morphine (MS CONTIN) 30 MG 12 hr tablet Take 30 mg by mouth 3 (three) times daily.       Marland Kitchen morphine (MSIR) 30 MG tablet Take 30 mg by mouth 4 (four) times daily.       Marland Kitchen OLANZapine (ZYPREXA) 5 MG tablet Take 5 mg by mouth 2 (two) times daily as needed.        . topiramate (TOPAMAX) 100 MG tablet  Take 3 at bedtime        Past Medical History  Diagnosis Date  . Nephrolithiasis   . MVP (mitral valve prolapse)   . SVT (supraventricular tachycardia)   . PAF (paroxysmal atrial fibrillation)   . Herniated disc   . Personal history of colonic polyps 06/26/2007    tubular adenoma   Past Surgical History  Procedure Date  . Tonsillectomy   . Urethra surgery   . Cardiac electrophysiology mapping and ablation    History   Social History  . Marital Status: Widowed    Spouse Name: N/A    Number of Children: N/A  . Years of Education: N/A   Social History Main Topics  . Smoking status: Current Everyday Smoker -- 0.8 packs/day    Types: Cigarettes  . Smokeless tobacco: Never Used   Comment: started smoking at age 52. Counseling sheet given in exam room to quit smoking 02-13-2011  . Alcohol Use: Yes     rarely  . Drug Use: No  . Sexually Active: None   Other Topics Concern  . None    Social History Narrative  . None   Family History  Problem Relation Age of Onset  . Stomach cancer Paternal Aunt   . Stomach cancer Father   . Coronary artery disease Father   . Hypertension Father   . Colon cancer Father        Physical Exam: General well developed well nourished patient in no acute distress, appearing her stated age Eyes PERRLA, no icterus, fundoscopic exam per opthamologist Skin no lesions noted Neck supple, no adenopathy, no thyroid enlargement, no tenderness Chest clear to percussion and auscultation Heart no significant murmurs, gallops or rubs noted Abdomen no hepatosplenomegaly masses or tenderness, BS normal.  Extremities no acute joint lesions, edema, phlebitis or evidence of cellulitis. Neurologic patient oriented x 3, cranial nerves intact, no focal neurologic deficits noted. Psychological mental status normal and normal affect.  Assessment and plan: Chronic CONSTIPATION from chronic narcotic use. She does have a family history of colon cancer with a previous large colon polyp resected in 2009, and I've scheduled her for followup colonoscopy with propofol sedation after a" double prep". I will continue her daily Prevacid for GERD, and have also continued daily MiraLax but we'll try Linzess 290 mg a day her constipation.  Encounter Diagnoses  Name Primary?  . Personal history of colonic polyps   . Abdominal pain   . Constipation

## 2011-02-14 ENCOUNTER — Ambulatory Visit (INDEPENDENT_AMBULATORY_CARE_PROVIDER_SITE_OTHER): Payer: Medicare Other | Admitting: Obstetrics & Gynecology

## 2011-02-14 ENCOUNTER — Encounter: Payer: Self-pay | Admitting: Obstetrics & Gynecology

## 2011-02-14 VITALS — BP 112/69 | HR 103 | Temp 97.1°F | Resp 16 | Ht 65.0 in | Wt 132.0 lb

## 2011-02-14 DIAGNOSIS — N921 Excessive and frequent menstruation with irregular cycle: Secondary | ICD-10-CM

## 2011-02-14 NOTE — Progress Notes (Signed)
  Subjective:    Patient ID: Mary [name], female    DOB: [birth year], 46 y.o.   MRN: [id number]  HPI Pt presents for f/u of frequent uterine bleeding approx q 2 weeks.  Pt had a US showing a <1 cm fibroid with partial submucosal component.  Pt has nml CBC and FSH (pt's last FHS was 25, now <10).  No pain with bleeding.  Pt has nml pap with neg HPV.  Also has nml mammogram.    Review of Systems  Constitutional: Positive for fatigue.       Pt can't afford provigil  Genitourinary: Positive for vaginal bleeding.  Musculoskeletal: Positive for back pain.  Psychiatric/Behavioral: Positive for agitation.       Objective:   Physical Exam  Constitutional: She is oriented to person, place, and time. She appears well-developed and well-nourished.       Tearful when talking about back pain  HENT:  Head: Normocephalic and atraumatic.  Pulmonary/Chest: Effort normal.  Musculoskeletal:       Walks without problem  Neurological: She is alert and oriented to person, place, and time.  Skin: Skin is warm and dry.  Psychiatric:       Tearful.  Nml thought process          Assessment & Plan:  46 year old female with metrorrhagia and possible fibroid with submucosal component.  1-Pt bled again b/t visits.  Lining was thin.  Will do endometrial biopsy to r/u hyperplasia / malignancy.  Pt having back pain today and has colonoscopy scheduled next week, so she would like to wait.  2-Nml TSH  3-Has pharmacist review her meds (Mindy), and no meds causing increased prolactin.

## 2011-02-16 ENCOUNTER — Telehealth: Payer: Self-pay | Admitting: *Deleted

## 2011-02-16 NOTE — Telephone Encounter (Signed)
Called and left patient a message to see if she can come for an earlier appointment.

## 2011-02-19 ENCOUNTER — Ambulatory Visit (AMBULATORY_SURGERY_CENTER): Payer: Medicare Other | Admitting: Gastroenterology

## 2011-02-19 ENCOUNTER — Encounter: Payer: Self-pay | Admitting: Gastroenterology

## 2011-02-19 ENCOUNTER — Encounter: Payer: Medicare Other | Admitting: Gastroenterology

## 2011-02-19 ENCOUNTER — Other Ambulatory Visit: Payer: Self-pay | Admitting: Gastroenterology

## 2011-02-19 DIAGNOSIS — K5730 Diverticulosis of large intestine without perforation or abscess without bleeding: Secondary | ICD-10-CM

## 2011-02-19 DIAGNOSIS — R109 Unspecified abdominal pain: Secondary | ICD-10-CM | POA: Insufficient documentation

## 2011-02-19 DIAGNOSIS — D126 Benign neoplasm of colon, unspecified: Secondary | ICD-10-CM

## 2011-02-19 DIAGNOSIS — K5900 Constipation, unspecified: Secondary | ICD-10-CM

## 2011-02-19 DIAGNOSIS — Z86010 Personal history of colonic polyps: Secondary | ICD-10-CM

## 2011-02-19 DIAGNOSIS — C179 Malignant neoplasm of small intestine, unspecified: Secondary | ICD-10-CM | POA: Insufficient documentation

## 2011-02-19 MED ORDER — SODIUM CHLORIDE 0.9 % IV SOLN: 500.0000 mL | INTRAVENOUS | Status: DC

## 2011-02-19 NOTE — Progress Notes (Signed)
Patient did not experience any of the following events: a burn prior to discharge; a fall within the facility; wrong site/side/patient/procedure/implant event; or a hospital transfer or hospital admission upon discharge from the facility. (G8907) Patient did not have preoperative order for IV antibiotic SSI prophylaxis. (G8918)  

## 2011-02-19 NOTE — Patient Instructions (Signed)
YOU HAD AN ENDOSCOPIC PROCEDURE TODAY AT THE [facility]: Refer to the procedure report that was given to you for any specific questions about what was found during the examination.  If the procedure report does not answer your questions, please call your gastroenterologist to clarify.  If you requested that your care partner not be given the details of your procedure findings, then the procedure report has been included in a sealed envelope for you to review at your convenience later.  YOU SHOULD EXPECT: Some feelings of bloating in the abdomen. Passage of more gas than usual.  Walking can help get rid of the air that was put into your GI tract during the procedure and reduce the bloating. If you had a lower endoscopy (such as a colonoscopy or flexible sigmoidoscopy) you may notice spotting of blood in your stool or on the toilet paper. If you underwent a bowel prep for your procedure, then you may not have a normal bowel movement for a few days.  DIET: Your first meal following the procedure should be a light meal and then it is ok to progress to your normal diet.  A half-sandwich or bowl of soup is an example of a good first meal.  Heavy or fried foods are harder to digest and may make you feel nauseous or bloated.  Likewise meals heavy in dairy and vegetables can cause extra gas to form and this can also increase the bloating.  Drink plenty of fluids but you should avoid alcoholic beverages for 24 hours.  ACTIVITY: Your care partner should take you home directly after the procedure.  You should plan to take it easy, moving slowly for the rest of the day.  You can resume normal activity the day after the procedure however you should NOT DRIVE or use heavy machinery for 24 hours (because of the sedation medicines used during the test).    SYMPTOMS TO REPORT IMMEDIATELY: A gastroenterologist can be reached at any hour.  During normal business hours, 8:30 AM to 5:00 PM Monday through Friday,  call [phone].  After hours and on weekends, please call the GI answering service at [phone] who will take a message and have the physician on call contact you.   Following lower endoscopy (colonoscopy or flexible sigmoidoscopy):  Excessive amounts of blood in the stool  Significant tenderness or worsening of abdominal pains  Swelling of the abdomen that is new, acute  Fever of 100F or higher  Following upper endoscopy (EGD)  Vomiting of blood or coffee ground material  New chest pain or pain under the shoulder blades  Painful or persistently difficult swallowing  New shortness of breath  Fever of 100F or higher  Black, tarry-looking stools  FOLLOW UP: If any biopsies were taken you will be contacted by phone or by letter within the next 1-3 weeks.  Call your gastroenterologist if you have not heard about the biopsies in 3 weeks.  Our staff will call the home number listed on your records the next business day following your procedure to check on you and address any questions or concerns that you may have at that time regarding the information given to you following your procedure. This is a courtesy call and so if there is no answer at the home number and we have not heard from you through the emergency physician on call, we will assume that you have returned to your regular daily activities without incident.  SIGNATURES/CONFIDENTIALITY: You and/or your care   partner have signed paperwork which will be entered into your electronic medical record.  These signatures attest to the fact that that the information above on your After Visit Summary has been reviewed and is understood.  Full responsibility of the confidentiality of this discharge information lies with you and/or your care-partner.  

## 2011-02-19 NOTE — Op Note (Signed)
[facility] [address] [place], [place]  [address]  COLONOSCOPY PROCEDURE REPORT  PATIENT:  Mary, [name]  MR#:  [id number] BIRTHDATE:  [birth year], 45 yrs. old  GENDER:  female ENDOSCOPIST:  Vania Rea. Jarold Motto, MD, [facility] REF. BY: PROCEDURE DATE:  02/19/2011 PROCEDURE:  Colonoscopy with snare polypectomy ASA CLASS:  Class II INDICATIONS:  history of polyps MEDICATIONS:   propofol (Diprivan) 340 mg IV  DESCRIPTION OF PROCEDURE:   After the risks and benefits and of the procedure were explained, informed consent was obtained. Digital rectal exam was performed and revealed no abnormalities. The LB CF-[id number] endoscope was introduced through the anus and advanced to the cecum, which was identified by both the appendix and ileocecal valve.  The quality of the prep was excellent, using MoviPrep.  The instrument was then slowly withdrawn as the colon was fully examined. <<PROCEDUREIMAGES>>  FINDINGS:  There were mild diverticular changes in left colon. diverticulosis was found.  There were multiple polyps identified and removed. in the right colon. 2-8 MM FLAT RIGHT COLON POLYPS HOT SNARE EXCISED  This was otherwise a normal examination of the colon. VERY TORTUOUS AND REDUNDANT SIGMOID COLON.   Retroflexed views in the rectum revealed no abnormalities.    The scope was then withdrawn from the patient and the procedure completed.  COMPLICATIONS:  None ENDOSCOPIC IMPRESSION: 1) Diverticulosis,mild,left sided diverticulosis 2) Polyps, multiple in the right colon 3) Otherwise normal examination RECOMMENDATIONS: 1) Await pathology results 2) Repeat Colonoscopy in 3 years. ?? GENETIC COLOCANCER SYNDROME PER FH.  REPEAT EXAM:  No  ______________________________ Vania Rea. Jarold Motto, MD, [facility]  CC:  Nani Gasser, MD  n. Rosalie Doctor:   Vania Rea. Tarryn Bogdan at 02/19/2011 10:16 AM  Norva Karvonen, [phone]

## 2011-02-20 ENCOUNTER — Telehealth: Payer: Self-pay | Admitting: *Deleted

## 2011-02-20 NOTE — Telephone Encounter (Signed)
  Follow up Call-  Call back number 02/19/2011  Post procedure Call Back phone  # [phone]  Permission to leave phone message Yes     Patient questions:  Do you have a fever, pain , or abdominal swelling? no Pain Score  0 *  Have you tolerated food without any problems? yes  Have you been able to return to your normal activities? yes  Do you have any questions about your discharge instructions: Diet   no Medications  no Follow up visit  no  Do you have questions or concerns about your Care? no  Actions: * If pain score is 4 or above: No action needed, pain <4.

## 2011-02-22 ENCOUNTER — Telehealth: Payer: Self-pay | Admitting: *Deleted

## 2011-02-22 NOTE — Telephone Encounter (Signed)
Message copied by Florene Glen on Thu Feb 22, 2011 12:51 PM ------      Message from: Jarold Motto, DAVID R      Created: Thu Feb 22, 2011 10:59 AM       Not a candidate per $$...see prior genetics consult      ----- Message -----         From: Linna Hoff, RN         Sent: 02/22/2011   9:44 AM           To: Sheryn Bison, MD            Your note on COLON on 02/19/11, you wrote "??genetic colocancer syndrome" per fh; do I need to refer her for genetic counseling? Thanks.

## 2011-02-23 ENCOUNTER — Encounter: Payer: Self-pay | Admitting: Gastroenterology

## 2011-03-06 ENCOUNTER — Encounter: Payer: Self-pay | Admitting: Obstetrics & Gynecology

## 2011-03-06 ENCOUNTER — Ambulatory Visit (INDEPENDENT_AMBULATORY_CARE_PROVIDER_SITE_OTHER): Payer: Medicare Other | Admitting: Obstetrics & Gynecology

## 2011-03-06 VITALS — BP 109/67 | HR 113 | Temp 96.9°F | Resp 16 | Ht 64.0 in | Wt 130.0 lb

## 2011-03-06 DIAGNOSIS — N939 Abnormal uterine and vaginal bleeding, unspecified: Secondary | ICD-10-CM

## 2011-03-06 DIAGNOSIS — N938 Other specified abnormal uterine and vaginal bleeding: Secondary | ICD-10-CM

## 2011-03-06 DIAGNOSIS — Z01812 Encounter for preprocedural laboratory examination: Secondary | ICD-10-CM

## 2011-03-06 DIAGNOSIS — Z1509 Genetic susceptibility to other malignant neoplasm: Secondary | ICD-10-CM

## 2011-03-06 DIAGNOSIS — N926 Irregular menstruation, unspecified: Secondary | ICD-10-CM

## 2011-03-06 LAB — POCT URINE PREGNANCY: Preg Test, Ur: NEGATIVE

## 2011-03-06 NOTE — Patient Instructions (Signed)
Endometrial Biopsy This is a test in which a tissue sample (a biopsy) is taken from inside the uterus (womb). It is then looked at by a specialist under a microscope to see if the tissue is normal or abnormal. The endometrium is the lining of the uterus. This test helps determine where you are in your menstrual cycle and how hormone levels are affecting the lining of the uterus. Another use for this test is to diagnose endometrial cancer, tuberculosis, polyps, or inflammatory conditions and to evaluate uterine bleeding. PREPARATION FOR TEST No preparation or fasting is necessary. NORMAL FINDINGS No pathologic conditions. Presence of "secretory-type" endometrium 3 to 5 days before to normal menstruation. Ranges for normal findings may vary among different laboratories and hospitals. You should always check with your doctor after having lab work or other tests done to discuss the meaning of your test results and whether your values are considered within normal limits. MEANING OF TEST  Your caregiver will go over the test results with you and discuss the importance and meaning of your results, as well as treatment options and the need for additional tests if necessary. OBTAINING THE TEST RESULTS It is your responsibility to obtain your test results. Ask the lab or department performing the test when and how you will get your results. Document Released: 04/20/2004 Document Revised: 12/07/2010 Document Reviewed: 11/28/2007 ExitCare Patient Information 2012 [employer]. 

## 2011-03-06 NOTE — Progress Notes (Signed)
Addended by: Granville Lewis on: 03/06/2011 02:58 PM   Modules accepted: Orders

## 2011-03-06 NOTE — Progress Notes (Signed)
  Subjective:    Patient ID: Mary [name], female    DOB: [birth year], 46 y.o.   MRN: [id number]  HPI Pt here for endometrial biopsy due to irregular bleeding.  Pt Lynch positive.   Review of Systems     Objective:   Physical Exam  Patient given informed consent, signed copy in the chart, time out was performed. Appropriate time out taken. . The patient was placed in the lithotomy position and the cervix brought into view with sterile speculum.  Portio of cervix cleansed x 2 with betadine swabs.  A tenaculum was placed in the anterior lip of the cervix.  Cervical dilators were necessary to enter the endometrial cavity.  The uterus was sounded for depth of 8cm. A pipelle was introduced to into the uterus, suction created,  and an endometrial sample was obtained. All equipment was removed and accounted for.  The patient tolerated the procedure well.    Patient given post procedure instructions. The patient will return in 2 weeks for results.  Pt elaning towards surgery for polyp vs submucosal fibroid.      Assessment & Plan:

## 2011-03-21 ENCOUNTER — Ambulatory Visit (INDEPENDENT_AMBULATORY_CARE_PROVIDER_SITE_OTHER): Payer: Medicare Other | Admitting: Obstetrics & Gynecology

## 2011-03-21 ENCOUNTER — Encounter: Payer: Self-pay | Admitting: Obstetrics & Gynecology

## 2011-03-21 VITALS — BP 116/75 | HR 97 | Temp 98.5°F | Resp 16 | Ht 64.0 in | Wt 130.0 lb

## 2011-03-21 DIAGNOSIS — N938 Other specified abnormal uterine and vaginal bleeding: Secondary | ICD-10-CM

## 2011-03-21 DIAGNOSIS — N926 Irregular menstruation, unspecified: Secondary | ICD-10-CM

## 2011-03-21 MED ORDER — MISOPROSTOL 200 MCG PO TABS: ORAL_TABLET | ORAL | Status: DC

## 2011-03-21 MED ORDER — MEDROXYPROGESTERONE ACETATE 10 MG PO TABS: 10.0000 mg | ORAL_TABLET | Freq: Every day | ORAL | Status: DC

## 2011-03-21 NOTE — Patient Instructions (Signed)
Endometrial Ablation Endometrial ablation removes the lining of the uterus (endometrium). It is usually a same day, outpatient treatment. Ablation helps avoid major surgery (such as a hysterectomy). A hysterectomy is removal of the cervix and uterus. Endometrial ablation has less risk and complications, has a shorter recovery period and is less expensive. After endometrial ablation, most women will have little or no menstrual bleeding. You may not keep your fertility. Pregnancy is no longer likely after this procedure but if you are pre-menopausal, you still need to use a reliable method of birth control following the procedure because pregnancy can occur. REASONS TO HAVE THE PROCEDURE MAY INCLUDE:  Heavy periods.   Bleeding that is causing anemia.   Anovulatory bleeding, very irregular, bleeding.   Bleeding submucous fibroids (on the lining inside the uterus) if they are smaller than 3 centimeters.  REASONS NOT TO HAVE THE PROCEDURE MAY INCLUDE:  You wish to have more children.   You have a pre-cancerous or cancerous problem. The cause of any abnormal bleeding must be diagnosed before having the procedure.   You have pain coming from the uterus.   You have a submucus fibroid larger than 3 centimeters.   You recently had a baby.   You recently had an infection in the uterus.   You have a severe retro-flexed, tipped uterus and cannot insert the instrument to do the ablation.   You had a Cesarean section or deep major surgery on the uterus.   The inner cavity of the uterus is too large for the endometrial ablation instrument.  RISKS AND COMPLICATIONS   Perforation of the uterus.   Bleeding.   Infection of the uterus, bladder or vagina.   Injury to surrounding organs.   Cutting the cervix.   An air bubble to the lung (air embolus).   Pregnancy following the procedure.   Failure of the procedure to help the problem requiring hysterectomy.   Decreased ability to diagnose  cancer in the lining of the uterus.  BEFORE THE PROCEDURE  The lining of the uterus must be tested to make sure there is no pre-cancerous or cancer cells present.   Medications may be given to make the lining of the uterus thinner.   Ultrasound may be used to evaluate the size and look for abnormalities of the uterus.   Future pregnancy is not desired.  PROCEDURE  There are different ways to destroy the lining of the uterus.   Resectoscope - radio frequency-alternating electric current is the most common one used.   Cryotherapy - freezing the lining of the uterus.   Heated Free Liquid - heated salt (saline) solution inserted into the uterus.   Microwave - uses high energy microwaves in the uterus.   Thermal Balloon - a catheter with a balloon tip is inserted into the uterus and filled with heated fluid.  Your caregiver will talk with you about the method used in this clinic. They will also instruct you on the pros and cons of the procedure. Endometrial ablation is performed along with a procedure called operative hysteroscopy. A narrow viewing tube is inserted through the birth canal (vagina) and through the cervix into the uterus. A tiny camera attached to the viewing tube (hysteroscope) allows the uterine cavity to be shown on a TV monitor during surgery. Your uterus is filled with a harmless liquid to make the procedure easier. The lining of the uterus is then removed. The lining can also be removed with a resectoscope which allows your surgeon   to cut away the lining of the uterus under direct vision. Usually, you will be able to go home within an hour after the procedure. HOME CARE INSTRUCTIONS   Do not drive for 24 hours.   No tampons, douching or intercourse for 2 weeks or until your caregiver approves.   Rest at home for 24 to 48 hours. You may then resume normal activities unless told differently by your caregiver.   Take your temperature two times a day for 4 days, and record  it.   Take any medications your caregiver has ordered, as directed.   Use some form of contraception if you are pre-menopausal and do not want to get pregnant.  Bleeding after the procedure is normal. It varies from light spotting and mildly watery to bloody discharge for 4 to 6 weeks. You may also have mild cramping. Only take over-the-counter or prescription medicines for pain, discomfort, or fever as directed by your caregiver. Do not use aspirin, as this may aggravate bleeding. Frequent urination during the first 24 hours is normal. You will not know how effective your surgery is until at least 3 months after the surgery. SEEK IMMEDIATE MEDICAL CARE IF:   Bleeding is heavier than a normal menstrual cycle.   An oral temperature above 102 F (38.9 C) develops.   You have increasing cramps or pains not relieved with medication or develop belly (abdominal) pain which does not seem to be related to the same area of earlier cramping and pain.   You are light headed, weak or have fainting episodes.   You develop pain in the shoulder strap areas.   You have chest or leg pain.   You have abnormal vaginal discharge.   You have painful urination.  Document Released: 10/28/2003 Document Revised: 12/07/2010 Document Reviewed: 01/25/2007 ExitCare Patient Information 2012 [employer]. 

## 2011-03-21 NOTE — Progress Notes (Signed)
  Subjective:    Patient ID: Mary [name], female    DOB: 03/10/1965, 46 y.o.   MRN: [id number]  HPI  Pt presents for test results and plan.  P bled 2 weeks after biopsy.  Biospy showed diffuse secretory endometrium which mad biopsy hard to read but was negative.  US shows 1 cm fibroid with probably submucosal component.  Pt would like treatment b/c she bleeds every 2 weeks.  Will proceed with D & C, hysteroscopy, possible myosure myomectomy, and hydrothermal ablation.  Pt consented for D & C, hysteroscopy, possible fibroid resection, and hydrothermal endometrial ablation.  Risks include but not limited to bleeding, infection, damage to uterus, burn in the vagina.   Review of Systems  Constitutional: Negative.   Cardiovascular: Negative.   Gastrointestinal: Negative.   Genitourinary: Positive for vaginal bleeding.  Musculoskeletal: Positive for arthralgias.       Objective:   Physical Exam  Vitals reviewed. Constitutional: She is oriented to person, place, and time. She appears well-developed and well-nourished. No distress.  Neurological: She is alert and oriented to person, place, and time.  Skin: Skin is warm and dry.  Psychiatric: She has a normal mood and affect.          Assessment & Plan:  46 yo female with metrorrhagia and possible submucosal fibroid.  1- D & C, hysteroscopy, possible myosure myomectomy, and hydrothermal ablation 2-Provera 2 weeks b/f procedure to thin endometrium to aid in visualization 3-cyctotec prior to procedure 4-Med clearance

## 2011-03-23 ENCOUNTER — Telehealth: Payer: Self-pay | Admitting: Family Medicine

## 2011-03-23 NOTE — Telephone Encounter (Signed)
Call pt: Needs appt for medical clearance for possible surgery. 30 min appt.

## 2011-03-28 NOTE — Telephone Encounter (Signed)
Pt.notified

## 2011-04-03 ENCOUNTER — Ambulatory Visit: Payer: Medicare Other | Admitting: Family Medicine

## 2011-04-03 ENCOUNTER — Encounter (HOSPITAL_COMMUNITY): Payer: Self-pay

## 2011-04-05 ENCOUNTER — Encounter: Payer: Self-pay | Admitting: Family Medicine

## 2011-04-05 ENCOUNTER — Ambulatory Visit (INDEPENDENT_AMBULATORY_CARE_PROVIDER_SITE_OTHER): Payer: Medicare Other | Admitting: Family Medicine

## 2011-04-05 VITALS — BP 131/81 | HR 111 | Ht 64.5 in | Wt 131.0 lb

## 2011-04-05 DIAGNOSIS — Z23 Encounter for immunization: Secondary | ICD-10-CM

## 2011-04-05 DIAGNOSIS — Z01818 Encounter for other preprocedural examination: Secondary | ICD-10-CM

## 2011-04-05 DIAGNOSIS — R768 Other specified abnormal immunological findings in serum: Secondary | ICD-10-CM

## 2011-04-05 DIAGNOSIS — R21 Rash and other nonspecific skin eruption: Secondary | ICD-10-CM

## 2011-04-05 DIAGNOSIS — R5383 Other fatigue: Secondary | ICD-10-CM

## 2011-04-05 DIAGNOSIS — R5381 Other malaise: Secondary | ICD-10-CM

## 2011-04-05 LAB — BASIC METABOLIC PANEL
BUN: 12 mg/dL (ref 6–23)
CO2: 27 mEq/L (ref 19–32)
Chloride: 108 mEq/L (ref 96–112)
Glucose, Bld: 84 mg/dL (ref 70–99)
Potassium: 4.3 mEq/L (ref 3.5–5.3)
Sodium: 142 mEq/L (ref 135–145)

## 2011-04-05 LAB — CBC
HCT: 41.1 % (ref 36.0–46.0)
Hemoglobin: 13.8 g/dL (ref 12.0–15.0)
MCH: 31.3 pg (ref 26.0–34.0)
MCV: 93.2 fL (ref 78.0–100.0)
RBC: 4.41 MIL/uL (ref 3.87–5.11)

## 2011-04-05 NOTE — Patient Instructions (Signed)
We will call you with your lab results. If you don't here from us in about a week then please give us a call at [phone].  

## 2011-04-05 NOTE — Progress Notes (Signed)
Subjective:    Patient ID: Mary [name], female    DOB: 1965-07-04, 46 y.o.   MRN: [id number]  HPI Here for pre-op clearnace for pelvic surgery.  No CP or SOB.  Does have episods of palpitations.  Had a stress test done yesterday with Dr. Mayme Genta.  Says it turned out well but we will need to call for copy of report.  Hx of fatigue and malar rash and ployathralgia.  Had talked with Dr. Cathey Endow about this previsously.  She would really liked to be eval for lupus.    Patient Active Problem List  Diagnoses  . DEPRESSION, MAJOR, RECURRENT  . ANXIETY  . TOBACCO DEPENDENCE  . MITRAL VALVE DISORDER  . ATRIAL FIBRILLATION  . Obstructive chronic bronchitis without exacerbation  . DIVERTICULOSIS OF COLON  . Other Constipation  . OVARIAN CYST  . AMENORRHEA, SECONDARY  . MENORRHAGIA, PERIMENOPAUSAL  . BACK PAIN W/RADIATION, UNSPECIFIED  . Unspecified myalgia and myositis  . CALF PAIN, LEFT  . EDEMA  . NIGHT SWEATS  . PULMONARY NODULE  . Rhinitis due to pollen  . Personal history of colonic polyps  . Constipation  . GERD (gastroesophageal reflux disease)  . Abdominal pain  . Metrorrhagia  . Benign neoplasm of colon  . Abdominal pain, unspecified site  . Lynch syndrome  . DUB (dysfunctional uterine bleeding)     Review of Systems  BP 131/81  Pulse 111  Ht 5' 4.5" (1.638 m)  Wt 131 lb (59.421 kg)  BMI 22.14 kg/m2  SpO2 100%  LMP 03/10/2011    Allergies  Allergen Reactions  . Albuterol Other (See Comments)    REACTION: shakey, tachycardic, arrythmia, chest pain  . Atenolol Nausea And Vomiting    Decreased BP  . Benzoyl Peroxide Swelling    Topical--eyes swell shut  . Codeine Other (See Comments)    Arrythmia but can take percocet  . Cyclobenzaprine Hcl Nausea Only  . Diphenhydramine Hcl Other (See Comments)    "drugged feeling"  . Erythromycin Nausea And Vomiting  . Fentanyl Other (See Comments)    Patch -made her get low BP,  Can get IV w/o problem  . Sulfonamide  Derivatives Swelling and Rash    Itching swelling visit ER    Past Medical History  Diagnosis Date  . Nephrolithiasis   . MVP (mitral valve prolapse)   . SVT (supraventricular tachycardia)   . PAF (paroxysmal atrial fibrillation)   . Herniated disc   . Personal history of colonic polyps 06/26/2007    tubular adenoma    Past Surgical History  Procedure Date  . Tonsillectomy   . Urethra surgery   . Cardiac electrophysiology mapping and ablation   . Colonoscopy     History   Social History  . Marital Status: Widowed    Spouse Name: N/A    Number of Children: N/A  . Years of Education: N/A   Occupational History  . Not on file.   Social History Main Topics  . Smoking status: Current Everyday Smoker -- 0.8 packs/day    Types: Cigarettes  . Smokeless tobacco: Never Used   Comment: started smoking at age 62. Counseling sheet given in exam room to quit smoking 02-13-2011  . Alcohol Use: Yes     rarely  . Drug Use: No  . Sexually Active: Not on file   Other Topics Concern  . Not on file   Social History Narrative  . No narrative on file    Family History  Problem Relation Age of Onset  . Stomach cancer Paternal Aunt   . Coronary artery disease Father   . Hypertension Father   . Colon cancer Father     Current outpatient prescriptions:amphetamine-dextroamphetamine (ADDERALL) 10 MG tablet, Take 30-50 tablets by mouth 2 (two) times daily. Take 5 tablets in am and 3 tablets by mouth at noon, Disp: , Rfl: ;  baclofen (LIORESAL) 20 MG tablet, Take 20 mg by mouth 3 (three) times daily as needed. Back pain, Disp: , Rfl:  clonazePAM (KLONOPIN) 1 MG tablet, Take 1 mg by mouth 3 (three) times daily as needed. Take 1/2 tablet two times a day and 2 tablets by mouth at bedtime as needed anxiety, Disp: , Rfl: ;  HYDROmorphone (DILAUDID) 4 MG tablet, Take 4 mg by mouth every 4 (four) hours. For herniated disc, Disp: , Rfl: ;  lansoprazole (PREVACID) 30 MG capsule, Take one tablet by  mouth, Disp: 30 capsule, Rfl: 11 Linaclotide 290 MCG CAPS, Take 1 capsule by mouth daily before breakfast., Disp: 30 capsule, Rfl: 1;  medroxyPROGESTERone (PROVERA) 10 MG tablet, Take 1 tablet (10 mg total) by mouth daily., Disp: 21 tablet, Rfl: 0;  misoprostol (CYTOTEC) 200 MCG tablet, Insert 1 tablet per vagina at 10 pm thenight before surgery, Disp: 1 tablet, Rfl: 0;  modafinil (PROVIGIL) 200 MG tablet, Take 400 mg by mouth 2 (two) times daily. , Disp: , Rfl:  Multiple Vitamin (MULTIVITAMIN) capsule, Take 1 capsule by mouth daily.  , Disp: , Rfl: ;  OLANZapine (ZYPREXA) 5 MG tablet, Take 5 mg by mouth as needed. Panic attacks, Disp: , Rfl: ;  perphenazine (TRILAFON) 2 MG tablet, Take 2 mg by mouth at bedtime as needed. Night terrors, Disp: , Rfl: ;  polyethylene glycol powder (MIRALAX) powder, Take 17 g by mouth daily as needed. constipation, Disp: , Rfl:  promethazine (PHENERGAN) 25 MG tablet, Take 25 mg by mouth every 4 (four) hours as needed. nausea, Disp: , Rfl: ;  Vilazodone HCl (VIIBRYD) 40 MG TABS, Take 40 mg by mouth daily. Takes 60mg  (1 and 1/2 tablet by mouth after breakfast)., Disp: , Rfl:      Objective:   Physical Exam  Constitutional: She is oriented to person, place, and time. She appears well-developed and well-nourished.  HENT:  Head: Normocephalic and atraumatic.  Right Ear: External ear normal.  Left Ear: External ear normal.  Nose: Nose normal.  Mouth/Throat: Oropharynx is clear and moist.       TMs and canals are clear.   Eyes: Conjunctivae and EOM are normal. Pupils are equal, round, and reactive to light.  Neck: Neck supple. No thyromegaly present.  Cardiovascular: Normal rate, regular rhythm and normal heart sounds.        No carotid bruits.   Pulmonary/Chest: Effort normal and breath sounds normal. She has no wheezes.  Abdominal: Soft. Bowel sounds are normal. She exhibits no distension and no mass. There is no tenderness. There is no rebound and no guarding.    Musculoskeletal: She exhibits no edema.  Lymphadenopathy:    She has no cervical adenopathy.  Neurological: She is alert and oriented to person, place, and time.  Skin: Skin is warm and dry.  Psychiatric: She has a normal mood and affect. Her behavior is normal.          Assessment & Plan:  Pre -op Exam for uterine ablation.  Exam is normal today Stress test she had performed yesterday was normal.  Please see attached copy of  the Stress test, labs, and EKG. She is cleared for pelvic surgery.   Pneumonia vaccine given today.   Fatigue with arthralgia - Will check CBC, CMP, sed rate, ANA for now but explained SLE is a clinical dx and would need rheum referral for further evaluation. Consider referral after her surgery.

## 2011-04-06 ENCOUNTER — Other Ambulatory Visit: Payer: Self-pay | Admitting: Family Medicine

## 2011-04-06 ENCOUNTER — Encounter: Payer: Self-pay | Admitting: Family Medicine

## 2011-04-11 ENCOUNTER — Encounter (HOSPITAL_COMMUNITY): Payer: Self-pay

## 2011-04-11 ENCOUNTER — Encounter (HOSPITAL_COMMUNITY)
Admission: RE | Admit: 2011-04-11 | Discharge: 2011-04-11 | Disposition: A | Discharge status: Home / Self Care | Payer: Medicare Other | Source: Ambulatory Visit | Attending: Obstetrics & Gynecology | Admitting: Obstetrics & Gynecology

## 2011-04-11 DIAGNOSIS — Z01812 Encounter for preprocedural laboratory examination: Secondary | ICD-10-CM | POA: Insufficient documentation

## 2011-04-11 DIAGNOSIS — D259 Leiomyoma of uterus, unspecified: Secondary | ICD-10-CM | POA: Insufficient documentation

## 2011-04-11 HISTORY — DX: Other complications of anesthesia, initial encounter: T88.59XA

## 2011-04-11 HISTORY — DX: Shortness of breath: R06.02

## 2011-04-11 HISTORY — DX: Adverse effect of unspecified anesthetic, initial encounter: T41.45XA

## 2011-04-11 HISTORY — DX: Anxiety disorder, unspecified: F41.9

## 2011-04-11 HISTORY — DX: Mental disorder, not otherwise specified: F99

## 2011-04-11 LAB — CBC
MCH: 31.3 pg (ref 26.0–34.0)
MCV: 92.9 fL (ref 78.0–100.0)
Platelets: 297 10*3/uL (ref 150–400)
RDW: 13.8 % (ref 11.5–15.5)

## 2011-04-11 NOTE — Patient Instructions (Addendum)
21 Mary [name]  04/11/2011   Your procedure is scheduled on:  04/19/11  Enter through the Main Entrance of [facility] at 1130 AM.  Pick up the phone at the desk and dial [phone].   Call this number if you have problems the morning of surgery: [phone]   Remember:   Do not eat food:After Midnight.  Do not drink clear liquids: 4 Hours before arrival.  Take these medicines the morning of surgery with A SIP OF WATER: Prevacid   Do not wear jewelry, make-up or nail polish.  Do not wear lotions, powders, or perfumes. You may wear deodorant.  Do not shave 48 hours prior to surgery.  Do not bring valuables to the hospital.  Contacts, dentures or bridgework may not be worn into surgery.  Leave suitcase in the car. After surgery it may be brought to your room.  For patients admitted to the hospital, checkout time is 11:00 AM the day of discharge.   Patients discharged the day of surgery will not be allowed to drive home.  Name and phone number of your driver: mother  Special Instructions: CHG Shower Use Special Wash: 1/2 bottle night before surgery and 1/2 bottle morning of surgery.   Please read over the following fact sheets that you were given: Surgical Site Infection Prevention

## 2011-04-15 NOTE — H&P (Signed)
Mary [name] is an 46 y.o. female. With bleeding every 2 weeks.  Pt states the bleedin is heavy at times.  Pt has small fibroid with partial submucosal component.  Pt has negative endometrial biopsy. hgb is nml.  Pt has been on provera for 2 weeks prior to surgery and has not bled.   Pertinent Gynecological History: Menses: flow is moderate and irregular occurring approximately every 14 days with spotting approximately 12 days per month Bleeding: dysfunctional uterine bleeding Contraception: abstinence DES exposure: denies  Menstrual History:  Patient's last menstrual period was 03/10/2011.    Past Medical History  Diagnosis Date  . MVP (mitral valve prolapse)   . Herniated disc   . Personal history of colonic polyps 06/26/2007    tubular adenoma  . Complication of anesthesia     "hard to put to sleep", woke up during procedure in the past  . SVT (supraventricular tachycardia)     hx of ablation  . PAF (paroxysmal atrial fibrillation)   . Mental disorder     PTSD  . Anxiety   . Shortness of breath     Due to Dilaudid and Morphine    Past Surgical History  Procedure Date  . Tonsillectomy   . Urethra surgery   . Cardiac electrophysiology mapping and ablation   . Colonoscopy   . Back surgery     several    Family History  Problem Relation Age of Onset  . Stomach cancer Paternal Aunt   . Coronary artery disease Father   . Hypertension Father   . Colon cancer Father     Social History:  reports that she has been smoking Cigarettes.  She has been smoking about .8 packs per day. She has never used smokeless tobacco. She reports that she drinks alcohol. She reports that she does not use illicit drugs.  Allergies:  Allergies  Allergen Reactions  . Albuterol Other (See Comments)    REACTION: shakey, tachycardic, arrythmia, chest pain  . Atenolol Nausea And Vomiting    Decreased BP  . Benzoyl Peroxide Swelling    Topical--eyes swell shut  . Codeine Other (See  Comments)    Arrythmia but can take percocet  . Cyclobenzaprine Hcl Nausea Only  . Diphenhydramine Hcl Other (See Comments)    "drugged feeling"  . Erythromycin Nausea And Vomiting  . Fentanyl Other (See Comments)    Patch -made her get low BP,  Can get IV w/o problem  . Sulfonamide Derivatives Swelling and Rash    Itching swelling visit ER    No prescriptions prior to admission   CBC    Component Value Date/Time   WBC 8.7 04/11/2011 1215   RBC 4.25 04/11/2011 1215   HGB 13.3 04/11/2011 1215   HCT 39.5 04/11/2011 1215   PLT 297 04/11/2011 1215   MCV 92.9 04/11/2011 1215   MCH 31.3 04/11/2011 1215   MCHC 33.7 04/11/2011 1215   RDW 13.8 04/11/2011 1215   LYMPHSABS 3.7 11/09/2010 1113   MONOABS 0.7 11/09/2010 1113   EOSABS 0.2 11/09/2010 1113   BASOSABS 0.1 11/09/2010 1113      Review of Systems  Constitutional: Negative.   Respiratory: Positive for cough.   Cardiovascular: Negative.   Gastrointestinal: Negative.   Musculoskeletal: Positive for back pain.  Neurological: Negative.   Psychiatric/Behavioral: Negative.     Last menstrual period 03/10/2011. Physical Exam  Vitals reviewed. Constitutional: She is oriented to person, place, and time. She appears well-developed and well-nourished. No distress.  HENT:  Head: Normocephalic and atraumatic.  Eyes: Conjunctivae are normal.  Neck: Neck supple. No thyromegaly present.  Cardiovascular: Normal rate and regular rhythm.   Respiratory: Breath sounds normal.  GI: Soft. She exhibits no distension and no mass. There is no tenderness. There is no rebound and no guarding.  Genitourinary:       Repeat pelvic in OR  Neurological: She is alert and oriented to person, place, and time.  Skin: Skin is warm and dry.  Psychiatric: She has a normal mood and affect.     Assessment/Plan: 46 year old female with dysfunctional uterine bleeding.  Possible small fibroid with submucosal component.  Pt consented for diagnostic hysteroscopy, and  endometrial ablation.  Given the very small size of the fibroid, ablation should be all that is necessary.  Fibroid resection was discussed but will not be done at this time.  Risks include but not limited to bleeding, infection, damage to the uterus, vaginal burn, having to suspend the procedure if there is greater than 10 cc fluid loss.  Pt also understands that pregnancy should not be attempted after an ablation for risk of preterm labor, accreta, IUGR and other adverse pregnancy problems.  Mary [name] H. 04/15/2011, 7:31 PM

## 2011-04-19 ENCOUNTER — Encounter (HOSPITAL_COMMUNITY): Payer: Self-pay | Admitting: Anesthesiology

## 2011-04-19 ENCOUNTER — Ambulatory Visit (HOSPITAL_COMMUNITY)
Admission: RE | Admit: 2011-04-19 | Discharge: 2011-04-19 | Disposition: A | Discharge status: Home / Self Care | Payer: Medicare Other | Source: Ambulatory Visit | Attending: Obstetrics & Gynecology | Admitting: Obstetrics & Gynecology

## 2011-04-19 ENCOUNTER — Encounter (HOSPITAL_COMMUNITY): Payer: Self-pay | Admitting: *Deleted

## 2011-04-19 ENCOUNTER — Ambulatory Visit (HOSPITAL_COMMUNITY): Payer: Medicare Other | Admitting: Anesthesiology

## 2011-04-19 ENCOUNTER — Encounter (HOSPITAL_COMMUNITY)
Admission: RE | Disposition: A | Discharge status: Home / Self Care | Payer: Self-pay | Source: Ambulatory Visit | Attending: Obstetrics & Gynecology

## 2011-04-19 DIAGNOSIS — Z01818 Encounter for other preprocedural examination: Secondary | ICD-10-CM | POA: Insufficient documentation

## 2011-04-19 DIAGNOSIS — N949 Unspecified condition associated with female genital organs and menstrual cycle: Secondary | ICD-10-CM | POA: Insufficient documentation

## 2011-04-19 DIAGNOSIS — Z01812 Encounter for preprocedural laboratory examination: Secondary | ICD-10-CM | POA: Insufficient documentation

## 2011-04-19 DIAGNOSIS — N938 Other specified abnormal uterine and vaginal bleeding: Secondary | ICD-10-CM | POA: Insufficient documentation

## 2011-04-19 DIAGNOSIS — N920 Excessive and frequent menstruation with regular cycle: Secondary | ICD-10-CM

## 2011-04-19 HISTORY — PX: ENDOMETRIAL ABLATION: SHX621

## 2011-04-19 LAB — PREGNANCY, URINE: Preg Test, Ur: NEGATIVE

## 2011-04-19 SURGERY — DILATATION & CURETTAGE/HYSTEROSCOPY WITH HYDROTHERMAL ABLATION
Anesthesia: General | Site: Vagina | Wound class: Clean Contaminated

## 2011-04-19 MED ORDER — ONDANSETRON HCL 4 MG/2ML IJ SOLN
INTRAMUSCULAR | Status: DC | PRN
  Administered 2011-04-19: 4 mg via INTRAVENOUS

## 2011-04-19 MED ORDER — LACTATED RINGERS IV SOLN
INTRAVENOUS | Status: DC
  Administered 2011-04-19: 12:00:00 via INTRAVENOUS

## 2011-04-19 MED ORDER — FENTANYL CITRATE 0.05 MG/ML IJ SOLN
INTRAMUSCULAR | Status: AC
  Filled 2011-04-19: qty 2

## 2011-04-19 MED ORDER — MIDAZOLAM HCL 5 MG/5ML IJ SOLN
INTRAMUSCULAR | Status: DC | PRN
  Administered 2011-04-19: 2 mg via INTRAVENOUS

## 2011-04-19 MED ORDER — ONDANSETRON HCL 4 MG/2ML IJ SOLN
INTRAMUSCULAR | Status: AC
  Filled 2011-04-19: qty 2

## 2011-04-19 MED ORDER — KETOROLAC TROMETHAMINE 30 MG/ML IJ SOLN
INTRAMUSCULAR | Status: AC
  Filled 2011-04-19: qty 1

## 2011-04-19 MED ORDER — FENTANYL CITRATE 0.05 MG/ML IJ SOLN
INTRAMUSCULAR | Status: DC | PRN
  Administered 2011-04-19: 50 ug via INTRAVENOUS
  Administered 2011-04-19: 100 ug via INTRAVENOUS
  Administered 2011-04-19: 50 ug via INTRAVENOUS

## 2011-04-19 MED ORDER — MIDAZOLAM HCL 2 MG/2ML IJ SOLN
INTRAMUSCULAR | Status: AC
  Filled 2011-04-19: qty 2

## 2011-04-19 MED ORDER — HYDROMORPHONE HCL PF 1 MG/ML IJ SOLN
INTRAMUSCULAR | Status: AC
  Administered 2011-04-19: 1 mg via INTRAVENOUS
  Filled 2011-04-19: qty 1

## 2011-04-19 MED ORDER — KETOROLAC TROMETHAMINE 30 MG/ML IJ SOLN
INTRAMUSCULAR | Status: DC | PRN
  Administered 2011-04-19: 30 mg via INTRAVENOUS

## 2011-04-19 MED ORDER — LIDOCAINE HCL (CARDIAC) 20 MG/ML IV SOLN
INTRAVENOUS | Status: DC | PRN
  Administered 2011-04-19: 100 mg via INTRAVENOUS

## 2011-04-19 MED ORDER — PHENYLEPHRINE 40 MCG/ML (10ML) SYRINGE FOR IV PUSH (FOR BLOOD PRESSURE SUPPORT)
PREFILLED_SYRINGE | INTRAVENOUS | Status: AC
  Filled 2011-04-19: qty 5

## 2011-04-19 MED ORDER — HYDROMORPHONE HCL PF 1 MG/ML IJ SOLN
1.0000 mg | Freq: Once | INTRAMUSCULAR | Status: AC
  Administered 2011-04-19: 1 mg via INTRAVENOUS

## 2011-04-19 MED ORDER — PROPOFOL 10 MG/ML IV EMUL
INTRAVENOUS | Status: DC | PRN
  Administered 2011-04-19: 200 mg via INTRAVENOUS

## 2011-04-19 MED ORDER — IBUPROFEN 600 MG PO TABS: 600.0000 mg | ORAL_TABLET | Freq: Four times a day (QID) | ORAL | Status: AC | PRN

## 2011-04-19 MED ORDER — HYDROMORPHONE HCL PF 1 MG/ML IJ SOLN
0.2500 mg | INTRAMUSCULAR | Status: DC | PRN
  Administered 2011-04-19 (×2): 0.5 mg via INTRAVENOUS

## 2011-04-19 MED ORDER — HYDROMORPHONE HCL PF 1 MG/ML IJ SOLN
INTRAMUSCULAR | Status: AC
  Administered 2011-04-19: 0.5 mg via INTRAVENOUS
  Filled 2011-04-19: qty 1

## 2011-04-19 MED ORDER — PHENYLEPHRINE HCL 10 MG/ML IJ SOLN
INTRAMUSCULAR | Status: DC | PRN
  Administered 2011-04-19: 100 ug via INTRAVENOUS

## 2011-04-19 SURGICAL SUPPLY — 15 items
CATH ROBINSON RED A/P 16FR (CATHETERS) ×2 IMPLANT
CLOTH BEACON ORANGE TIMEOUT ST (SAFETY) ×2 IMPLANT
CONTAINER PREFILL 10% NBF 60ML (FORM) ×4 IMPLANT
DRAPE HYSTEROSCOPY (DRAPE) ×2 IMPLANT
ELECT REM PT RETURN 9FT ADLT (ELECTROSURGICAL)
ELECTRODE REM PT RTRN 9FT ADLT (ELECTROSURGICAL) IMPLANT
GLOVE BIO SURGEON STRL SZ7 (GLOVE) ×2 IMPLANT
GLOVE BIOGEL PI IND STRL 7.0 (GLOVE) ×2 IMPLANT
GLOVE BIOGEL PI INDICATOR 7.0 (GLOVE) ×2
GOWN PREVENTION PLUS LG XLONG (DISPOSABLE) ×4 IMPLANT
GOWN STRL REIN XL XLG (GOWN DISPOSABLE) ×2 IMPLANT
NS IRRIG 1000ML POUR BTL (IV SOLUTION) ×2 IMPLANT
PACK VAGINAL MINOR WOMEN LF (CUSTOM PROCEDURE TRAY) ×2 IMPLANT
SET GENESYS HTA PROCERVA (MISCELLANEOUS) ×2 IMPLANT
TOWEL OR 17X24 6PK STRL BLUE (TOWEL DISPOSABLE) ×4 IMPLANT

## 2011-04-19 NOTE — Discharge Instructions (Signed)
Endometrial Ablation Endometrial ablation removes the lining of the uterus (endometrium). It is usually a same day, outpatient treatment. Ablation helps avoid major surgery (such as a hysterectomy). A hysterectomy is removal of the cervix and uterus. Endometrial ablation has less risk and complications, has a shorter recovery period and is less expensive. After endometrial ablation, most women will have little or no menstrual bleeding. You may not keep your fertility. Pregnancy is no longer likely after this procedure but if you are pre-menopausal, you still need to use a reliable method of birth control following the procedure because pregnancy can occur. REASONS TO HAVE THE PROCEDURE MAY INCLUDE:  Heavy periods.   Bleeding that is causing anemia.   Anovulatory bleeding, very irregular, bleeding.   Bleeding submucous fibroids (on the lining inside the uterus) if they are smaller than 3 centimeters.  REASONS NOT TO HAVE THE PROCEDURE MAY INCLUDE:  You wish to have more children.   You have a pre-cancerous or cancerous problem. The cause of any abnormal bleeding must be diagnosed before having the procedure.   You have pain coming from the uterus.   You have a submucus fibroid larger than 3 centimeters.   You recently had a baby.   You recently had an infection in the uterus.   You have a severe retro-flexed, tipped uterus and cannot insert the instrument to do the ablation.   You had a Cesarean section or deep major surgery on the uterus.   The inner cavity of the uterus is too large for the endometrial ablation instrument.  RISKS AND COMPLICATIONS   Perforation of the uterus.   Bleeding.   Infection of the uterus, bladder or vagina.   Injury to surrounding organs.   Cutting the cervix.   An air bubble to the lung (air embolus).   Pregnancy following the procedure.   Failure of the procedure to help the problem requiring hysterectomy.   Decreased ability to diagnose  cancer in the lining of the uterus.  BEFORE THE PROCEDURE  The lining of the uterus must be tested to make sure there is no pre-cancerous or cancer cells present.   Medications may be given to make the lining of the uterus thinner.   Ultrasound may be used to evaluate the size and look for abnormalities of the uterus.   Future pregnancy is not desired.  PROCEDURE  There are different ways to destroy the lining of the uterus.   Resectoscope - radio frequency-alternating electric current is the most common one used.   Cryotherapy - freezing the lining of the uterus.   Heated Free Liquid - heated salt (saline) solution inserted into the uterus.   Microwave - uses high energy microwaves in the uterus.   Thermal Balloon - a catheter with a balloon tip is inserted into the uterus and filled with heated fluid.  Your caregiver will talk with you about the method used in this clinic. They will also instruct you on the pros and cons of the procedure. Endometrial ablation is performed along with a procedure called operative hysteroscopy. A narrow viewing tube is inserted through the birth canal (vagina) and through the cervix into the uterus. A tiny camera attached to the viewing tube (hysteroscope) allows the uterine cavity to be shown on a TV monitor during surgery. Your uterus is filled with a harmless liquid to make the procedure easier. The lining of the uterus is then removed. The lining can also be removed with a resectoscope which allows your surgeon   to cut away the lining of the uterus under direct vision. Usually, you will be able to go home within an hour after the procedure. HOME CARE INSTRUCTIONS   Do not drive for 24 hours.   No tampons, douching or intercourse for 2 weeks or until your caregiver approves.   Rest at home for 24 to 48 hours. You may then resume normal activities unless told differently by your caregiver.   Take your temperature two times a day for 4 days, and record  it.   Take any medications your caregiver has ordered, as directed.   Use some form of contraception if you are pre-menopausal and do not want to get pregnant.  Bleeding after the procedure is normal. It varies from light spotting and mildly watery to bloody discharge for 4 to 6 weeks. You may also have mild cramping. Only take over-the-counter or prescription medicines for pain, discomfort, or fever as directed by your caregiver. Do not use aspirin, as this may aggravate bleeding. Frequent urination during the first 24 hours is normal. You will not know how effective your surgery is until at least 3 months after the surgery. SEEK IMMEDIATE MEDICAL CARE IF:   Bleeding is heavier than a normal menstrual cycle.   An oral temperature above 102 F (38.9 C) develops.   You have increasing cramps or pains not relieved with medication or develop belly (abdominal) pain which does not seem to be related to the same area of earlier cramping and pain.   You are light headed, weak or have fainting episodes.   You develop pain in the shoulder strap areas.   You have chest or leg pain.   You have abnormal vaginal discharge.   You have painful urination.  Document Released: 10/28/2003 Document Revised: 12/07/2010 Document Reviewed: 01/25/2007 ExitCare Patient Information 2012 ExitCare, LLCDISCHARGE INSTRUCTIONS: HYSTEROSCOPY / ENDOMETRIAL ABLATION The following instructions have been prepared to help you care for yourself upon your return home.  Personal hygiene: . Use sanitary pads for vaginal drainage, not tampons. . Shower the day after your procedure. . NO tub baths, pools or Jacuzzis for 2-3 weeks. . Wipe front to back after using the bathroom.  Activity and limitations: . Do NOT drive or operate any equipment for 24 hours. The effects of anesthesia are still present and drowsiness may result. . Do NOT rest in bed all day. . Walking is encouraged. . Walk up and down stairs slowly. .  You may resume your normal activity in one to two days or as indicated by your physician. Sexual activity: NO intercourse for at least 2 weeks after the procedure, or as indicated by your Doctor.  Diet: Eat a light meal as desired this evening. You may resume your usual diet tomorrow.  Return to Work: You may resume your work activities in one to two days or as indicated by your Doctor.  What to expect after your surgery: Expect to have vaginal bleeding/discharge for 2-3 days and spotting for up to 10 days. It is not unusual to have soreness for up to 1-2 weeks. You may have a slight burning sensation when you urinate for the first day. Mild cramps may continue for a couple of days. You may have a regular period in 2-6 weeks.  Call your doctor for any of the following: . Excessive vaginal bleeding or clotting, saturating and changing one pad every hour. . Inability to urinate 6 hours after discharge from hospital. . Pain not relieved by pain medication. .   Fever of 100.4 F or greater. . Unusual vaginal discharge or odor.  Return to office _________________Call for an appointment ___________________ Patient's signature: ______________________ Nurse's signature ________________________  Post Anesthesia Care Unit [phone] . 

## 2011-04-19 NOTE — Anesthesia Procedure Notes (Signed)
Procedure Name: LMA Insertion Date/Time: 04/19/2011 1:02 PM Performed by: Paymon Rosensteel, Jannet Askew Pre-anesthesia Checklist: Patient identified, Patient being monitored, Emergency Drugs available, Timeout performed and Suction available Patient Re-evaluated:Patient Re-evaluated prior to inductionOxygen Delivery Method: Circle system utilized Preoxygenation: Pre-oxygenation with 100% oxygen Intubation Type: IV induction Ventilation: Mask ventilation without difficulty

## 2011-04-19 NOTE — Transfer of Care (Signed)
Immediate Anesthesia Transfer of Care Note  Patient: Mary [name]  Procedure(s) Performed: Procedure(s) (LRB): DILATATION & CURETTAGE/HYSTEROSCOPY WITH HYDROTHERMAL ABLATION (N/A)  Patient Location: PACU  Anesthesia Type: General  Level of Consciousness: awake, alert  and oriented  Airway & Oxygen Therapy: Patient Spontanous Breathing and Patient connected to nasal cannula oxygen  Post-op Assessment: Report given to PACU RN and Post -op Vital signs reviewed and stable  Post vital signs: Reviewed and stable  Complications: No apparent anesthesia complications

## 2011-04-19 NOTE — Anesthesia Preprocedure Evaluation (Signed)
Anesthesia Evaluation  Patient identified by MRN, date of birth, ID band  Reviewed: Allergy & Precautions, H&P , NPO status , Patient's Chart, lab work & pertinent test results  Airway Mallampati: I TM Distance: >3 FB Neck ROM: full    Dental No notable dental hx. (+) Teeth Intact   Pulmonary    Pulmonary exam normal       Cardiovascular negative cardio ROS  + dysrhythmias Atrial Fibrillation Rhythm:Regular Rate:Normal     Neuro/Psych PSYCHIATRIC DISORDERS Anxiety Depression    GI/Hepatic Neg liver ROS, GERD-  Controlled,  Endo/Other  negative endocrine ROS  Renal/GU negative Renal ROS  negative genitourinary   Musculoskeletal negative musculoskeletal ROS (+)   Abdominal Normal abdominal exam  (+)   Peds negative pediatric ROS (+)  Hematology negative hematology ROS (+)   Anesthesia Other Findings   Reproductive/Obstetrics negative OB ROS                           Anesthesia Physical Anesthesia Plan  ASA: II  Anesthesia Plan: General   Post-op Pain Management:    Induction: Intravenous  Airway Management Planned: LMA  Additional Equipment:   Intra-op Plan:   Post-operative Plan:   Informed Consent: I have reviewed the patients History and Physical, chart, labs and discussed the procedure including the risks, benefits and alternatives for the proposed anesthesia with the patient or authorized representative who has indicated his/her understanding and acceptance.     Plan Discussed with: CRNA and Surgeon  Anesthesia Plan Comments: (1. Had normal cardiac stress test one week ago. 2. Does not really have GERD.)        Anesthesia Quick Evaluation

## 2011-04-19 NOTE — Op Note (Signed)
PREOPERATIVE DIAGNOSIS:  46 yo female with metrorrhagia   POSTOPERATIVE DIAGNOSIS: The same  PROCEDURE:  D & C, Hysteroscopy, Hydrothermal Endometrial Ablation  SURGEON:  Dr. Elsie Lincoln  INDICATIONS: 46 y.o. here for metrrohagia.  Risks of surgery were discussed with the patient including but not limited to: bleeding which may require transfusion; infection which may require antibiotics; injury to uterus leading to risk of injury to surrounding intraperitoneal organs, need for additional procedures including laparoscopy or laparotomy, and other postoperative/anesthesia complications. Written informed consent was obtained.   FINDINGS:  A small  anteverted uterus.  Normal ostia bilaterally. No submucosal fibroid or polyp  ANESTHESIA:   General ESTIMATED BLOOD LOSS:  Less than 20 ml. SPECIMENS: None COMPLICATIONS:  None immediate.  PROCEDURE DETAILS:  The patient was taken to the operating room where general anesthesia was administered and was found to be adequate.  After an adequate timeout was performed, she was placed in the dorsal lithotomy position and examined; then prepped and draped in the sterile manner.   Her bladder was catheterized for an unmeasured amount of clear, yellow urine. A speculum was then placed in the patient's vagina and a single tooth tenaculum was applied to the anterior lip of the cervix.  The cervix was dilated manually with half-sized Hegar dilators to accommodate the 8 mm hysteroscope.  Once the cervix was dilated, the hysteroscope was inserted under direct visualization. The uterine cavity was carefully examined, both ostia were recognized, and diffusely proliferative endometrium with polypoid fragments was noted.  A cervical seal test was conducted and there was no fluid loss.  The Hydrothermal ablation was conducted with a 10 minute ablation at 87 degrees centigrade.  There was a 1 minute 30 second cool down period.  The hysteroscope was removed and a gentle  curretage was performed. The tenaculum was removed from the anterior lip of the cervix, and the vaginal speculum was removed after noting good hemostasis.  The patient tolerated the procedure well and was taken to the recovery area awake, extubated and in stable condition.  The patient will be discharged to home as per PACU criteria.  Routine postoperative instructions given.  She was prescribed Ibuprofen.  She will follow up in my office in 3-4 weeks for postoperative evaluation .

## 2011-04-20 NOTE — Anesthesia Postprocedure Evaluation (Signed)
Anesthesia Post Note  Patient: Mary [name]  Procedure(s) Performed: Procedure(s) (LRB): DILATATION & CURETTAGE/HYSTEROSCOPY WITH HYDROTHERMAL ABLATION (N/A)  Anesthesia type: General  Patient location: PACU  Post pain: Pain level controlled  Post assessment: Post-op Vital signs reviewed  Last Vitals:  Filed Vitals:   04/19/11 1500  BP: 116/61  Pulse:   Temp: 37 C  Resp: 20    Post vital signs: Reviewed  Level of consciousness: sedated  Complications: No apparent anesthesia complications

## 2011-05-09 ENCOUNTER — Encounter: Payer: Self-pay | Admitting: Obstetrics & Gynecology

## 2011-05-09 ENCOUNTER — Ambulatory Visit (INDEPENDENT_AMBULATORY_CARE_PROVIDER_SITE_OTHER): Payer: Medicare Other | Admitting: Obstetrics & Gynecology

## 2011-05-09 VITALS — BP 103/68 | HR 88 | Temp 98.1°F | Resp 16 | Ht 64.0 in | Wt 129.0 lb

## 2011-05-09 DIAGNOSIS — N951 Menopausal and female climacteric states: Secondary | ICD-10-CM

## 2011-05-09 DIAGNOSIS — R232 Flushing: Secondary | ICD-10-CM

## 2011-05-09 MED ORDER — MEDROXYPROGESTERONE ACETATE 5 MG PO TABS: 5.0000 mg | ORAL_TABLET | Freq: Every day | ORAL | Status: DC

## 2011-05-09 NOTE — Progress Notes (Signed)
  Subjective:    Patient ID: Mary [name], female    DOB: 09-14-1965, 46 y.o.   MRN: [id number]  HPI  Pt presnets post op from ablation.   Pt had some watery bloody discharge for a few weeks which has now subsided.  Pt c/o hot flashes that have been occurring prior to surgery.  When she was on the provera pre op she did not have them.  Pt's last FSH was 9 in January.  Pt would like to try provera daily to see if it improves her hot flashes.  Reviewed risks of medication.  Review of Systems  Constitutional: Positive for fatigue.       Hot flashes  Genitourinary: Positive for vaginal bleeding and menstrual problem. Negative for dyspareunia.  Musculoskeletal: Positive for arthralgias.       Objective:   Physical Exam  Vitals reviewed. Constitutional: She appears well-developed and well-nourished.  Pulmonary/Chest: Effort normal.  Abdominal: Soft. She exhibits no distension and no mass. There is no tenderness. There is no rebound and no guarding.  Genitourinary: Vagina normal and uterus normal.  Skin: Skin is warm and dry.          Assessment & Plan:  46 yo female doing well after ablation Hot flashes improved with pre op provera  1- try Provera 5 mg a day 2- Pt to call with progress

## 2011-05-29 ENCOUNTER — Other Ambulatory Visit: Payer: Self-pay | Admitting: Gastroenterology

## 2011-07-25 ENCOUNTER — Encounter: Payer: Self-pay | Admitting: Family Medicine

## 2011-07-25 ENCOUNTER — Telehealth: Payer: Self-pay | Admitting: Critical Care Medicine

## 2011-07-25 DIAGNOSIS — Q2112 Patent foramen ovale: Secondary | ICD-10-CM | POA: Insufficient documentation

## 2011-07-25 DIAGNOSIS — Q211 Atrial septal defect: Secondary | ICD-10-CM | POA: Insufficient documentation

## 2011-07-25 NOTE — Telephone Encounter (Signed)
Spoke to pt 07/25/11 to tell her it was time to make her next appt with Dr Delford Field in [place].  Pt stated that she will call us back @ a later time to schedule this

## 2011-08-06 ENCOUNTER — Encounter: Payer: Self-pay | Admitting: Family Medicine

## 2011-12-02 HISTORY — PX: CARDIAC ELECTROPHYSIOLOGY MAPPING AND ABLATION: SHX1292

## 2011-12-04 ENCOUNTER — Ambulatory Visit (INDEPENDENT_AMBULATORY_CARE_PROVIDER_SITE_OTHER): Payer: Medicare Other | Admitting: Family Medicine

## 2011-12-04 DIAGNOSIS — Z23 Encounter for immunization: Secondary | ICD-10-CM

## 2011-12-04 NOTE — Progress Notes (Signed)
  Subjective:    Patient ID: Mary [name], female    DOB: 01/02/1965, 46 y.o.   MRN: [id number]  HPI    Review of Systems     Objective:   Physical Exam        Assessment & Plan:  Flu shot

## 2011-12-21 ENCOUNTER — Other Ambulatory Visit: Payer: Self-pay | Admitting: Gastroenterology

## 2012-02-07 ENCOUNTER — Ambulatory Visit: Payer: Medicare Other | Admitting: Family Medicine

## 2012-02-19 ENCOUNTER — Other Ambulatory Visit: Payer: Self-pay | Admitting: Gastroenterology

## 2012-08-02 ENCOUNTER — Other Ambulatory Visit: Payer: Self-pay | Admitting: Gastroenterology

## 2012-08-04 NOTE — Telephone Encounter (Signed)
PATIENT NEEDS AN OFFICE VISIT FOR FURTHER REFILLS  

## 2012-09-02 ENCOUNTER — Other Ambulatory Visit: Payer: Self-pay | Admitting: Gastroenterology

## 2012-09-08 ENCOUNTER — Encounter: Payer: Self-pay | Admitting: *Deleted

## 2012-09-09 ENCOUNTER — Other Ambulatory Visit (INDEPENDENT_AMBULATORY_CARE_PROVIDER_SITE_OTHER): Payer: Medicare Other

## 2012-09-09 ENCOUNTER — Encounter: Payer: Self-pay | Admitting: Gastroenterology

## 2012-09-09 ENCOUNTER — Ambulatory Visit (INDEPENDENT_AMBULATORY_CARE_PROVIDER_SITE_OTHER): Payer: Medicare Other | Admitting: Gastroenterology

## 2012-09-09 VITALS — BP 104/68 | HR 85 | Ht 64.0 in | Wt 135.2 lb

## 2012-09-09 DIAGNOSIS — Z860100 Personal history of colon polyps, unspecified: Secondary | ICD-10-CM

## 2012-09-09 DIAGNOSIS — K5900 Constipation, unspecified: Secondary | ICD-10-CM

## 2012-09-09 DIAGNOSIS — K6389 Other specified diseases of intestine: Secondary | ICD-10-CM

## 2012-09-09 DIAGNOSIS — T4275XA Adverse effect of unspecified antiepileptic and sedative-hypnotic drugs, initial encounter: Secondary | ICD-10-CM

## 2012-09-09 LAB — T4, FREE: Free T4: 0.96 ng/dL (ref 0.60–1.60)

## 2012-09-09 LAB — IBC PANEL
Iron: 66 ug/dL (ref 42–145)
Saturation Ratios: 12.7 % — ABNORMAL LOW (ref 20.0–50.0)
Transferrin: 371.1 mg/dL — ABNORMAL HIGH (ref 212.0–360.0)

## 2012-09-09 LAB — VITAMIN B12: Vitamin B-12: 459 pg/mL (ref 211–911)

## 2012-09-09 MED ORDER — LINACLOTIDE 290 MCG PO CAPS: 290.0000 ug | ORAL_CAPSULE | Freq: Every day | ORAL | Status: DC

## 2012-09-09 MED ORDER — NA SULFATE-K SULFATE-MG SULF 17.5-3.13-1.6 GM/177ML PO SOLN: ORAL | Status: DC

## 2012-09-09 NOTE — Patient Instructions (Addendum)
You have been given a separate informational sheet regarding your tobacco use, the importance of quitting and local resources to help you quit. _________________________________________________________________________________________________________________________________________________________________________________________________  You have been scheduled for a colonoscopy with propofol. Please follow written instructions given to you at your visit today.  Please pick up your prep kit at the pharmacy within the next 1-3 days. If you use inhalers (even only as needed), please bring them with you on the day of your procedure. Your physician has requested that you go to www.startemmi.com and enter the access code given to you at your visit today. This web site gives a general overview about your procedure. However, you should still follow specific instructions given to you by our office regarding your preparation for the procedure.  Refill on Linzess was sent to your pharmacy  Your physician has requested that you go to the basement for the following lab work before leaving today: Anemia Panel Celiac Panel TSH T4 FT4 FT3

## 2012-09-09 NOTE — Addendum Note (Signed)
Addended by: Ok Anis A on: 09/09/2012 03:55 PM   Modules accepted: Orders

## 2012-09-09 NOTE — Progress Notes (Signed)
This is a 47 year old Caucasian female with chronic narcotic bowel syndrome related to her use of Opana 20 mg twice a day chronic back pain.  She has a family history of colon cancer, and has seen our genetic counselor and is felt to have a variant of Lynch Syndrome.  A large cecal polyp was removed several years ago, and several followup colonoscopy since that time have been negative.  She's had some mild response to Linzess and 90 mcg a day but continues to use laxatives.  She has gas, bloating, but denies melena or hematochezia.  She complains of increasing weight gain fatigue, and review of her chart shows previous normal thyroid function.  She also complains of chronic nausea and apparently is on Phenergan 25 mg tablets per primary care.  Other medications include various psychotropic agents, and topical XL for mitral valve prolapse.  The patient relates that fiber supplements and made her constipation worse.  She could not tolerate Amitiza.  Allegedly she uses large amounts of by mouth liquids daily.  There no current upper GI or hepatobiliary complaints, specific food intolerances, or changes in her neurological status.   Current Medications, Allergies, Past Medical History, Past Surgical History, Family History and Social History were reviewed in [employer] record.  ROS: All systems were reviewed and are negative unless otherwise stated in the HPI.          Physical Exam: Blood pressure 104/68, pulse 85 and regular and weight 135 with BMI of 23.2.  Cannot appreciate stigmata of chronic liver disease.  Her abdomen shows no distention, organomegaly, masses or tenderness.  Bowel sounds are nonobstructive in nature.  Peripheral reflexes appear normal.  Mental status is normal.    Assessment and Plan: Chronic narcotic bowel syndrome with chronic constipation as her main problem.  However, she does have a very strong family history of colon cancer in a probable Lynch  Syndrome and a variant.  I've scheduled her for followup colonoscopy with a double prep.  I have renewed her Linzess or 90 mcg a day with liberal by mouth fluids, when necessary laxatives as needed, and we will check a full thyroid panel.  Celiac panel also ordered for review.  I've urged her to get her other medications through her primary care physician.  She is requesting Phenergan tablets at this time.

## 2012-09-10 LAB — CELIAC PANEL 10
Endomysial Screen: NEGATIVE
Gliadin IgG: 9.7 U/mL (ref <20)
IgA: 126 mg/dL (ref 69–380)
Tissue Transglutaminase Ab, IgA: 3.6 U/mL (ref <20)

## 2012-09-30 ENCOUNTER — Ambulatory Visit (AMBULATORY_SURGERY_CENTER): Payer: Medicare Other | Admitting: Gastroenterology

## 2012-09-30 ENCOUNTER — Encounter: Payer: Self-pay | Admitting: Gastroenterology

## 2012-09-30 VITALS — BP 108/73 | HR 78 | Temp 97.3°F | Resp 21 | Ht 64.0 in | Wt 130.0 lb

## 2012-09-30 DIAGNOSIS — Z86010 Personal history of colonic polyps: Secondary | ICD-10-CM

## 2012-09-30 DIAGNOSIS — K5900 Constipation, unspecified: Secondary | ICD-10-CM

## 2012-09-30 DIAGNOSIS — K5909 Other constipation: Secondary | ICD-10-CM

## 2012-09-30 DIAGNOSIS — R109 Unspecified abdominal pain: Secondary | ICD-10-CM

## 2012-09-30 MED ORDER — SODIUM CHLORIDE 0.9 % IV SOLN: 500.0000 mL | INTRAVENOUS | Status: DC

## 2012-09-30 NOTE — Patient Instructions (Addendum)
Discharge instructions given with verbal understanding. Normal exam. Resume previous medications. YOU HAD AN ENDOSCOPIC PROCEDURE TODAY AT THE [facility]: Refer to the procedure report that was given to you for any specific questions about what was found during the examination.  If the procedure report does not answer your questions, please call your gastroenterologist to clarify.  If you requested that your care partner not be given the details of your procedure findings, then the procedure report has been included in a sealed envelope for you to review at your convenience later.  YOU SHOULD EXPECT: Some feelings of bloating in the abdomen. Passage of more gas than usual.  Walking can help get rid of the air that was put into your GI tract during the procedure and reduce the bloating. If you had a lower endoscopy (such as a colonoscopy or flexible sigmoidoscopy) you may notice spotting of blood in your stool or on the toilet paper. If you underwent a bowel prep for your procedure, then you may not have a normal bowel movement for a few days.  DIET: Your first meal following the procedure should be a light meal and then it is ok to progress to your normal diet.  A half-sandwich or bowl of soup is an example of a good first meal.  Heavy or fried foods are harder to digest and may make you feel nauseous or bloated.  Likewise meals heavy in dairy and vegetables can cause extra gas to form and this can also increase the bloating.  Drink plenty of fluids but you should avoid alcoholic beverages for 24 hours.  ACTIVITY: Your care partner should take you home directly after the procedure.  You should plan to take it easy, moving slowly for the rest of the day.  You can resume normal activity the day after the procedure however you should NOT DRIVE or use heavy machinery for 24 hours (because of the sedation medicines used during the test).    SYMPTOMS TO REPORT IMMEDIATELY: A gastroenterologist  can be reached at any hour.  During normal business hours, 8:30 AM to 5:00 PM Monday through Friday, call [phone].  After hours and on weekends, please call the GI answering service at [phone] who will take a message and have the physician on call contact you.   Following lower endoscopy (colonoscopy or flexible sigmoidoscopy):  Excessive amounts of blood in the stool  Significant tenderness or worsening of abdominal pains  Swelling of the abdomen that is new, acute  Fever of 100F or higher  FOLLOW UP: If any biopsies were taken you will be contacted by phone or by letter within the next 1-3 weeks.  Call your gastroenterologist if you have not heard about the biopsies in 3 weeks.  Our staff will call the home number listed on your records the next business day following your procedure to check on you and address any questions or concerns that you may have at that time regarding the information given to you following your procedure. This is a courtesy call and so if there is no answer at the home number and we have not heard from you through the emergency physician on call, we will assume that you have returned to your regular daily activities without incident.  SIGNATURES/CONFIDENTIALITY: You and/or your care partner have signed paperwork which will be entered into your electronic medical record.  These signatures attest to the fact that that the information above on your After Visit Summary has been reviewed   and is understood.  Full responsibility of the confidentiality of this discharge information lies with you and/or your care-partner. 

## 2012-09-30 NOTE — Op Note (Signed)
[facility] [address] [place], [address]   COLONOSCOPY PROCEDURE REPORT  PATIENT: Mary [name], Mary [name].  MR#: [id number] BIRTHDATE: July 02, 1965 , 47  yrs. old GENDER: Female ENDOSCOPIST: Mardella Layman, MD, [facility] REFERRED BY: PROCEDURE DATE:  09/30/2012 PROCEDURE:   Colonoscopy, surveillance Prior Negative Screening - Now for repeat screening.  N/A History of Adenoma - Now for follow-up colonoscopy & has been > or = to 3 yrs.  Yes hx of adenoma.  Has been 3 or more years since last colonoscopy. ASA CLASS:   Class III INDICATIONS:Patient's personal history of adenomatous colon polyps and Constipation. MEDICATIONS: propofol (Diprivan) 350mg  IV  DESCRIPTION OF PROCEDURE:   After the risks benefits and alternatives of the procedure were thoroughly explained, informed consent was obtained.  A digital rectal exam revealed no abnormalities of the rectum.   The LB [id number]  endoscope was introduced through the anus and advanced to the cecum, which was identified by both the appendix and ileocecal valve. No adverse events experienced.   The quality of the prep was excellent, using MoviPrep  The instrument was then slowly withdrawn as the colon was fully examined.      COLON FINDINGS: A normal appearing cecum, ileocecal valve, and appendiceal orifice were identified.  The ascending, hepatic flexure, transverse, splenic flexure, descending, sigmoid colon and rectum appeared unremarkable.  No polyps or cancers were seen. Retroflexed views revealed no abnormalities. The time to cecum=2 minutes 33 seconds.  Withdrawal time=6 minutes 52 seconds.  The scope was withdrawn and the procedure completed. COMPLICATIONS: There were no complications.  ENDOSCOPIC IMPRESSION: Normal colon ..no polyps noted...chronic constipation exacerabated by chronic narcotic use...  RECOMMENDATIONS: 1.  Continue current medications 2.  Metamucil or benefiber 3.  Repeat  Colonoscopy in 5 years.   eSigned:  Mardella Layman, MD, [facility] 09/30/2012 3:02 PM   cc: Nani Gasser, MD

## 2012-09-30 NOTE — Progress Notes (Signed)
Report to pacu rn, vss, bbs=clear 

## 2012-09-30 NOTE — Progress Notes (Signed)
Patient did not experience any of the following events: a burn prior to discharge; a fall within the facility; wrong site/side/patient/procedure/implant event; or a hospital transfer or hospital admission upon discharge from the facility. (G8907) Patient did not have preoperative order for IV antibiotic SSI prophylaxis. (G8918)  

## 2012-10-01 ENCOUNTER — Telehealth: Payer: Self-pay | Admitting: *Deleted

## 2012-10-01 NOTE — Telephone Encounter (Signed)
  Follow up Call-  Call back number 09/30/2012 02/19/2011  Post procedure Call Back phone  # [phone]  Permission to leave phone message Yes Yes     Patient questions:  Do you have a fever, pain , or abdominal swelling? no Pain Score  0 *  Have you tolerated food without any problems? yes  Have you been able to return to your normal activities? yes  Do you have any questions about your discharge instructions: Diet   no Medications  no Follow up visit  no  Do you have questions or concerns about your Care? no  Actions: * If pain score is 4 or above: No action needed, pain <4.

## 2012-10-06 ENCOUNTER — Other Ambulatory Visit: Payer: Self-pay | Admitting: Family Medicine

## 2012-10-06 DIAGNOSIS — Z1231 Encounter for screening mammogram for malignant neoplasm of breast: Secondary | ICD-10-CM

## 2012-10-09 ENCOUNTER — Ambulatory Visit (INDEPENDENT_AMBULATORY_CARE_PROVIDER_SITE_OTHER): Payer: Medicare Other

## 2012-10-09 DIAGNOSIS — Z1231 Encounter for screening mammogram for malignant neoplasm of breast: Secondary | ICD-10-CM

## 2012-10-21 ENCOUNTER — Telehealth: Payer: Self-pay | Admitting: *Deleted

## 2012-10-21 NOTE — Telephone Encounter (Signed)
Pt notified. Also pt advised to contact ins co to make sure they will cover prevnar.

## 2012-10-21 NOTE — Telephone Encounter (Signed)
If still smokes then needs pneumonia vaccine Prevnar (13)

## 2012-10-21 NOTE — Telephone Encounter (Signed)
Pt left message asking that since her hx of pneumonia should she only get the flu shot or should she get flu & pneumonia vaccine? Please advise

## 2012-11-20 ENCOUNTER — Encounter: Payer: Self-pay | Admitting: Physician Assistant

## 2012-11-20 ENCOUNTER — Ambulatory Visit (INDEPENDENT_AMBULATORY_CARE_PROVIDER_SITE_OTHER): Payer: Medicare Other | Admitting: Physician Assistant

## 2012-11-20 VITALS — BP 131/86 | HR 110 | Temp 97.7°F | Wt 125.0 lb

## 2012-11-20 DIAGNOSIS — N390 Urinary tract infection, site not specified: Secondary | ICD-10-CM

## 2012-11-20 DIAGNOSIS — Z20818 Contact with and (suspected) exposure to other bacterial communicable diseases: Secondary | ICD-10-CM

## 2012-11-20 DIAGNOSIS — R300 Dysuria: Secondary | ICD-10-CM

## 2012-11-20 DIAGNOSIS — Z2089 Contact with and (suspected) exposure to other communicable diseases: Secondary | ICD-10-CM

## 2012-11-20 DIAGNOSIS — B379 Candidiasis, unspecified: Secondary | ICD-10-CM

## 2012-11-20 DIAGNOSIS — J029 Acute pharyngitis, unspecified: Secondary | ICD-10-CM

## 2012-11-20 LAB — POCT URINALYSIS DIPSTICK
Glucose, UA: NEGATIVE
Nitrite, UA: NEGATIVE
Spec Grav, UA: >=1.03
Urobilinogen, UA: 1

## 2012-11-20 LAB — POCT RAPID STREP A (OFFICE): Rapid Strep A Screen: NEGATIVE

## 2012-11-20 MED ORDER — AMOXICILLIN-POT CLAVULANATE 875-125 MG PO TABS: 1.0000 | ORAL_TABLET | Freq: Two times a day (BID) | ORAL | Status: DC

## 2012-11-20 MED ORDER — FLUCONAZOLE 150 MG PO TABS: 150.0000 mg | ORAL_TABLET | Freq: Once | ORAL | Status: DC

## 2012-11-20 NOTE — Patient Instructions (Signed)
Urinary Tract Infection  Urinary tract infections (UTIs) can develop anywhere along your urinary tract. Your urinary tract is your body's drainage system for removing wastes and extra water. Your urinary tract includes two kidneys, two ureters, a bladder, and a urethra. Your kidneys are a pair of bean-shaped organs. Each kidney is about the size of your fist. They are located below your ribs, one on each side of your spine.  CAUSES  Infections are caused by microbes, which are microscopic organisms, including fungi, viruses, and bacteria. These organisms are so small that they can only be seen through a microscope. Bacteria are the microbes that most commonly cause UTIs.  SYMPTOMS   Symptoms of UTIs may vary by age and gender of the patient and by the location of the infection. Symptoms in young women typically include a frequent and intense urge to urinate and a painful, burning feeling in the bladder or urethra during urination. Older women and men are more likely to be tired, shaky, and weak and have muscle aches and abdominal pain. A fever may mean the infection is in your kidneys. Other symptoms of a kidney infection include pain in your back or sides below the ribs, nausea, and vomiting.  DIAGNOSIS  To diagnose a UTI, your caregiver will ask you about your symptoms. Your caregiver also will ask to provide a urine sample. The urine sample will be tested for bacteria and white blood cells. White blood cells are made by your body to help fight infection.  TREATMENT   Typically, UTIs can be treated with medication. Because most UTIs are caused by a bacterial infection, they usually can be treated with the use of antibiotics. The choice of antibiotic and length of treatment depend on your symptoms and the type of bacteria causing your infection.  HOME CARE INSTRUCTIONS   If you were prescribed antibiotics, take them exactly as your caregiver instructs you. Finish the medication even if you feel better after you  have only taken some of the medication.   Drink enough water and fluids to keep your urine clear or pale yellow.   Avoid caffeine, tea, and carbonated beverages. They tend to irritate your bladder.   Empty your bladder often. Avoid holding urine for long periods of time.   Empty your bladder before and after sexual intercourse.   After a bowel movement, women should cleanse from front to back. Use each tissue only once.  SEEK MEDICAL CARE IF:    You have back pain.   You develop a fever.   Your symptoms do not begin to resolve within 3 days.  SEEK IMMEDIATE MEDICAL CARE IF:    You have severe back pain or lower abdominal pain.   You develop chills.   You have nausea or vomiting.   You have continued burning or discomfort with urination.  MAKE SURE YOU:    Understand these instructions.   Will watch your condition.   Will get help right away if you are not doing well or get worse.  Document Released: 09/27/2004 Document Revised: 06/19/2011 Document Reviewed: 01/26/2011  Exit[employer] Patient Information 2014 [employer].

## 2012-11-20 NOTE — Progress Notes (Signed)
  Subjective:    Patient ID: Mary [name], female    DOB: [birth year], 47 y.o.   MRN: [id number]  HPI Patient is a 47 -year-old female who presents to the clinic with UTI symptoms and sore throat. She started feel like she was getting sick 6 days ago. It started with a sore throat and her sore throat has remained. She denies sinus pressure or ear pain. She has not had a cough or any shortness of breath. .She does report a fever of 102 this week with chills. She denies any nausea or vomiting She's also had some painful urination, urinary leakage, increased urinary frequency. She has a history of done a few years back. She does have some vaginal white thick discharge with a history of yeast infections. She is trying to drink more water but has not tried anything over-the-counter due to her sensitivities and med list.   Review of Systems     Objective:   Physical Exam  Constitutional: She is oriented to person, place, and time. She appears well-developed and well-nourished.  HENT:  Head: Normocephalic and atraumatic.  Right Ear: External ear normal.  Left Ear: External ear normal.  TM's clear.  Tonsils removed bilaterally.  Erythematous oropharynx.   Eyes: Conjunctivae are normal.  Neck: Normal range of motion. Neck supple.  Cardiovascular: Normal rate, regular rhythm and normal heart sounds.   Pulmonary/Chest: Effort normal and breath sounds normal.  NO CVA tenderness.   Abdominal: Soft. Bowel sounds are normal. There is no tenderness.  Neurological: She is alert and oriented to person, place, and time.  Skin: Skin is warm and dry.  Psychiatric: She has a normal mood and affect. Her behavior is normal.          Assessment & Plan:  Acute pharyngitis/UTI/yeast infection- UA was positive for blood, nitrates, leukocytes. Rapid strep was negative. I did go ahead and treat with Augmentin for 10 days to cover urinary symptoms as well as upper respiratory symptoms. 2 tabs of Diflucan were  given since she currently has a urinary tract infection and has a propensity for yeast infections after antibiotics. Discussed symptomatic care. Call if not improving.

## 2013-01-05 DIAGNOSIS — F329 Major depressive disorder, single episode, unspecified: Secondary | ICD-10-CM | POA: Diagnosis not present

## 2013-01-05 DIAGNOSIS — F3289 Other specified depressive episodes: Secondary | ICD-10-CM | POA: Diagnosis not present

## 2013-01-05 DIAGNOSIS — Z23 Encounter for immunization: Secondary | ICD-10-CM | POA: Diagnosis not present

## 2013-01-14 ENCOUNTER — Ambulatory Visit (INDEPENDENT_AMBULATORY_CARE_PROVIDER_SITE_OTHER): Payer: Medicare Other | Admitting: Physician Assistant

## 2013-01-14 ENCOUNTER — Encounter: Payer: Self-pay | Admitting: Physician Assistant

## 2013-01-14 VITALS — BP 116/74 | HR 101 | Temp 98.2°F | Wt 121.0 lb

## 2013-01-14 DIAGNOSIS — J029 Acute pharyngitis, unspecified: Secondary | ICD-10-CM

## 2013-01-14 DIAGNOSIS — R059 Cough, unspecified: Secondary | ICD-10-CM | POA: Diagnosis not present

## 2013-01-14 DIAGNOSIS — J069 Acute upper respiratory infection, unspecified: Secondary | ICD-10-CM

## 2013-01-14 DIAGNOSIS — R05 Cough: Secondary | ICD-10-CM | POA: Diagnosis not present

## 2013-01-14 DIAGNOSIS — M545 Low back pain, unspecified: Secondary | ICD-10-CM | POA: Diagnosis not present

## 2013-01-14 DIAGNOSIS — M47817 Spondylosis without myelopathy or radiculopathy, lumbosacral region: Secondary | ICD-10-CM | POA: Diagnosis not present

## 2013-01-14 DIAGNOSIS — R509 Fever, unspecified: Secondary | ICD-10-CM

## 2013-01-14 DIAGNOSIS — M5137 Other intervertebral disc degeneration, lumbosacral region: Secondary | ICD-10-CM | POA: Diagnosis not present

## 2013-01-14 DIAGNOSIS — G894 Chronic pain syndrome: Secondary | ICD-10-CM | POA: Diagnosis not present

## 2013-01-14 LAB — POCT INFLUENZA A/B
Influenza A, POC: NEGATIVE
Influenza B, POC: NEGATIVE

## 2013-01-14 NOTE — Progress Notes (Signed)
   Subjective:    Patient ID: Mary [name], female    DOB: 05-22-1965, 48 y.o.   MRN: [id number]  HPI Patient is a 48 year old female who presents to the clinic with fever, sore throat, cough and nasal congestion. She reports symptoms started yesterday with body aches. She woke up this morning drenched in sweat. She started having productive cough early this morning with orangeish sputum. She's continued to run a fever all day long and been taking Tylenol and ibuprofen. Patient reports fever to have gotten as high as 102.2. She's not currently running a fever now. She continues to ache all over and had tremendous sinus pressure. She denies any shortness of breath or wheezing.   Review of Systems     Objective:   Physical Exam  Constitutional: She is oriented to person, place, and time. She appears well-developed and well-nourished.  HENT:  Head: Normocephalic and atraumatic.  Right Ear: External ear normal.  Left Ear: External ear normal.  TMs clear bilaterally.  Oropharynx erythematous with no tonsillar swelling or exudate. There is postnasal drip present.  Negative for maxillary or frontal sinus tenderness.  Nasal turbinates are red and swollen with rhinorrhea.  Eyes: Conjunctivae are normal. Right eye exhibits no discharge. Left eye exhibits no discharge.  Neck: Normal range of motion. Neck supple.  Bilateral anterior cervical lymphadenopathy that is not tender to touch.  Cardiovascular: Regular rhythm and normal heart sounds.   Tachycardia at 101.  Pulmonary/Chest: Effort normal and breath sounds normal. She has no wheezes.  Neurological: She is alert and oriented to person, place, and time.  Skin: Skin is dry.  Psychiatric: She has a normal mood and affect. Her behavior is normal.          Assessment & Plan:  Flulike symptoms/URI/sore throat/fever-influenza AMB was negative. Reassured patient that flu was negative but she certainly has flulike symptoms. At this point  I do not feel like there is an indication to treat with antibiotic. I discussed with patient multiple ways of symptomatic care. I encouraged patient to treat sore throat with honey, salt water gargles and ibuprofen. She certainly can use cough drops as well as Delsym over-the-counter for cough. Do not feel comfortable prescribing any narcotic cough syrup since pt on opana. Patient aware that these symptoms can last 4-8 days. Continue to keep fever down with ibuprofen. Call if symptoms worsening.

## 2013-01-14 NOTE — Patient Instructions (Addendum)
[facility]  Fever- ibuprofen 800mg  up to three times a day or Aleve(naproxen) 2 naproxen every 12 hours.   Upper Respiratory Infection, Adult An upper respiratory infection (URI) is also known as the common cold. It is often caused by a type of germ (virus). Colds are easily spread (contagious). You can pass it to others by kissing, coughing, sneezing, or drinking out of the same glass. Usually, you get better in 1 or 2 weeks.  HOME CARE   Only take medicine as told by your doctor.  Use a warm mist humidifier or breathe in steam from a hot shower.  Drink enough water and fluids to keep your pee (urine) clear or pale yellow.  Get plenty of rest.  Return to work when your temperature is back to normal or as told by your doctor. You may use a face mask and wash your hands to stop your cold from spreading. GET HELP RIGHT AWAY IF:   After the first few days, you feel you are getting worse.  You have questions about your medicine.  You have chills, shortness of breath, or brown or red spit (mucus).  You have yellow or brown snot (nasal discharge) or pain in the face, especially when you bend forward.  You have a fever, puffy (swollen) neck, pain when you swallow, or white spots in the back of your throat.  You have a bad headache, ear pain, sinus pain, or chest pain.  You have a high-pitched whistling sound when you breathe in and out (wheezing).  You have a lasting cough or cough up blood.  You have sore muscles or a stiff neck. MAKE SURE YOU:   Understand these instructions.  Will watch your condition.  Will get help right away if you are not doing well or get worse. Document Released: 06/06/2007 Document Revised: 03/12/2011 Document Reviewed: 04/24/2010 [facility] Patient Information 2014 [place], [place].   Cough, Adult  A cough is a reflex that helps clear your throat and airways. It can help heal the body or may be a reaction to an irritated airway. A cough may only  last 2 or 3 weeks (acute) or may last more than 8 weeks (chronic).  CAUSES Acute cough:  Viral or bacterial infections. Chronic cough:  Infections.  Allergies.  Asthma.  Post-nasal drip.  Smoking.  Heartburn or acid reflux.  Some medicines.  Chronic lung problems (COPD).  Cancer. SYMPTOMS   Cough.  Fever.  Chest pain.  Increased breathing rate.  High-pitched whistling sound when breathing (wheezing).  Colored mucus that you cough up (sputum). TREATMENT   A bacterial cough may be treated with antibiotic medicine.  A viral cough must run its course and will not respond to antibiotics.  Your caregiver may recommend other treatments if you have a chronic cough. HOME CARE INSTRUCTIONS   Only take over-the-counter or prescription medicines for pain, discomfort, or fever as directed by your caregiver. Use cough suppressants only as directed by your caregiver.  Use a cold steam vaporizer or humidifier in your bedroom or home to help loosen secretions.  Sleep in a semi-upright position if your cough is worse at night.  Rest as needed.  Stop smoking if you smoke. SEEK IMMEDIATE MEDICAL CARE IF:   You have pus in your sputum.  Your cough starts to worsen.  You cannot control your cough with suppressants and are losing sleep.  You begin coughing up blood.  You have difficulty breathing.  You develop pain which is getting  worse or is uncontrolled with medicine.  You have a fever. MAKE SURE YOU:   Understand these instructions.  Will watch your condition.  Will get help right away if you are not doing well or get worse. Document Released: 06/16/2010 Document Revised: 03/12/2011 Document Reviewed: 06/16/2010 [facility] Patient Information 2014 [place].   Pharyngitis Pharyngitis is redness, pain, and swelling (inflammation) of your pharynx.  CAUSES  Pharyngitis is usually caused by infection. Most of the time, these infections are from viruses  (viral) and are part of a cold. However, sometimes pharyngitis is caused by bacteria (bacterial). Pharyngitis can also be caused by allergies. Viral pharyngitis may be spread from person to person by coughing, sneezing, and personal items or utensils (cups, forks, spoons, toothbrushes). Bacterial pharyngitis may be spread from person to person by more intimate contact, such as kissing.  SIGNS AND SYMPTOMS  Symptoms of pharyngitis include:   Sore throat.   Tiredness (fatigue).   Low-grade fever.   Headache.  Joint pain and muscle aches.  Skin rashes.  Swollen lymph nodes.  Plaque-like film on throat or tonsils (often seen with bacterial pharyngitis). DIAGNOSIS  Your health care provider will ask you questions about your illness and your symptoms. Your medical history, along with a physical exam, is often all that is needed to diagnose pharyngitis. Sometimes, a rapid strep test is done. Other lab tests may also be done, depending on the suspected cause.  TREATMENT  Viral pharyngitis will usually get better in 3 4 days without the use of medicine. Bacterial pharyngitis is treated with medicines that kill germs (antibiotics).  HOME CARE INSTRUCTIONS   Drink enough water and fluids to keep your urine clear or pale yellow.   Only take over-the-counter or prescription medicines as directed by your health care provider:   If you are prescribed antibiotics, make sure you finish them even if you start to feel better.   Do not take aspirin.   Get lots of rest.   Gargle with 8 oz of salt water ( tsp of salt per 1 qt of water) as often as every 1 2 hours to soothe your throat.   Throat lozenges (if you are not at risk for choking) or sprays may be used to soothe your throat. SEEK MEDICAL CARE IF:   You have large, tender lumps in your neck.  You have a rash.  You cough up green, yellow-brown, or bloody spit. SEEK IMMEDIATE MEDICAL CARE IF:   Your neck becomes  stiff.  You drool or are unable to swallow liquids.  You vomit or are unable to keep medicines or liquids down.  You have severe pain that does not go away with the use of recommended medicines.  You have trouble breathing (not caused by a stuffy nose). MAKE SURE YOU:   Understand these instructions.  Will watch your condition.  Will get help right away if you are not doing well or get worse. Document Released: 12/18/2004 Document Revised: 10/08/2012 Document Reviewed: 08/25/2012 [facility] Patient Information 2014 [place].

## 2013-02-12 DIAGNOSIS — M5137 Other intervertebral disc degeneration, lumbosacral region: Secondary | ICD-10-CM | POA: Diagnosis not present

## 2013-02-12 DIAGNOSIS — G894 Chronic pain syndrome: Secondary | ICD-10-CM | POA: Diagnosis not present

## 2013-02-12 DIAGNOSIS — M545 Low back pain, unspecified: Secondary | ICD-10-CM | POA: Diagnosis not present

## 2013-02-12 DIAGNOSIS — M47817 Spondylosis without myelopathy or radiculopathy, lumbosacral region: Secondary | ICD-10-CM | POA: Diagnosis not present

## 2013-02-24 ENCOUNTER — Encounter: Payer: Self-pay | Admitting: Physician Assistant

## 2013-02-24 ENCOUNTER — Ambulatory Visit (INDEPENDENT_AMBULATORY_CARE_PROVIDER_SITE_OTHER): Payer: Medicare Other | Admitting: Physician Assistant

## 2013-02-24 VITALS — BP 120/71 | HR 97 | Wt 117.0 lb

## 2013-02-24 DIAGNOSIS — J069 Acute upper respiratory infection, unspecified: Secondary | ICD-10-CM

## 2013-02-24 DIAGNOSIS — J209 Acute bronchitis, unspecified: Secondary | ICD-10-CM

## 2013-02-24 MED ORDER — METHYLPREDNISOLONE SODIUM SUCC 125 MG IJ SOLR
125.0000 mg | Freq: Once | INTRAMUSCULAR | Status: AC
  Administered 2013-02-24: 125 mg via INTRAMUSCULAR

## 2013-02-24 MED ORDER — AZITHROMYCIN 250 MG PO TABS: ORAL_TABLET | ORAL | Status: DC

## 2013-02-24 NOTE — Patient Instructions (Addendum)
Continue on mucinex twice a day.   Acute Bronchitis Bronchitis is inflammation of the airways that extend from the windpipe into the lungs (bronchi). The inflammation often causes mucus to develop. This leads to a cough, which is the most common symptom of bronchitis.  In acute bronchitis, the condition usually develops suddenly and goes away over time, usually in a couple weeks. Smoking, allergies, and asthma can make bronchitis worse. Repeated episodes of bronchitis may cause further lung problems.  CAUSES Acute bronchitis is most often caused by the same virus that causes a cold. The virus can spread from person to person (contagious).  SIGNS AND SYMPTOMS   Cough.   Fever.   Coughing up mucus.   Body aches.   Chest congestion.   Chills.   Shortness of breath.   Sore throat.  DIAGNOSIS  Acute bronchitis is usually diagnosed through a physical exam. Tests, such as chest X-rays, are sometimes done to rule out other conditions.  TREATMENT  Acute bronchitis usually goes away in a couple weeks. Often times, no medical treatment is necessary. Medicines are sometimes given for relief of fever or cough. Antibiotics are usually not needed but may be prescribed in certain situations. In some cases, an inhaler may be recommended to help reduce shortness of breath and control the cough. A cool mist vaporizer may also be used to help thin bronchial secretions and make it easier to clear the chest.  HOME CARE INSTRUCTIONS  Get plenty of rest.   Drink enough fluids to keep your urine clear or pale yellow (unless you have a medical condition that requires fluid restriction). Increasing fluids may help thin your secretions and will prevent dehydration.   Only take over-the-counter or prescription medicines as directed by your health care provider.   Avoid smoking and secondhand smoke. Exposure to cigarette smoke or irritating chemicals will make bronchitis worse. If you are a smoker,  consider using nicotine gum or skin patches to help control withdrawal symptoms. Quitting smoking will help your lungs heal faster.   Reduce the chances of another bout of acute bronchitis by washing your hands frequently, avoiding people with cold symptoms, and trying not to touch your hands to your mouth, nose, or eyes.   Follow up with your health care provider as directed.  SEEK MEDICAL CARE IF: Your symptoms do not improve after 1 week of treatment.  SEEK IMMEDIATE MEDICAL CARE IF:  You develop an increased fever or chills.   You have chest pain.   You have severe shortness of breath.  You have bloody sputum.   You develop dehydration.  You develop fainting.  You develop repeated vomiting.  You develop a severe headache. MAKE SURE YOU:   Understand these instructions.  Will watch your condition.  Will get help right away if you are not doing well or get worse. Document Released: 01/26/2004 Document Revised: 08/20/2012 Document Reviewed: 06/10/2012 [facility] Patient Information 2014 [place].

## 2013-02-25 NOTE — Progress Notes (Signed)
   Subjective:    Patient ID: Mary [name], female    DOB: January 25, 1965, 48 y.o.   MRN: [id number]  HPI Pt presents to the clinic with cough, ST, sinus drainage for over one week. Cough is productive with greenish sputum. Denies any fever, chills. She has had some body aches and nausea but no vomiting. She complains of some wheezing at night and feeling SOB. She feels like congestion is stuck in her throat. Denies any sinus pressure or ear pain. Tried mucinex 2 tablets twice a day and did help but still having issues for greater than one week.    Review of Systems     Objective:   Physical Exam  Constitutional: She is oriented to person, place, and time. She appears well-developed and well-nourished.  HENT:  Head: Normocephalic and atraumatic.  Right Ear: External ear normal.  Left Ear: External ear normal.  Nose: Nose normal.  TM's clear bilaterally.   Oropharynx erythematous with PND present.   Negative for any maxillary or frontal sinus tenderness.  Eyes: Conjunctivae are normal.  Neck: Normal range of motion. Neck supple.  Cardiovascular: Normal rate, regular rhythm and normal heart sounds.   Pulmonary/Chest: Effort normal and breath sounds normal. She has no wheezes.  Lymphadenopathy:    She has no cervical adenopathy.  Neurological: She is alert and oriented to person, place, and time.  Skin: Skin is dry.  Psychiatric: She has a normal mood and affect. Her behavior is normal.          Assessment & Plan:  Acute bronchitis/URI- treated with solumedrol 125mg  in office. Zpak sent to pharmacy. Other symptomatic care given. Call if not improving.

## 2013-03-02 ENCOUNTER — Other Ambulatory Visit: Payer: Self-pay | Admitting: Gastroenterology

## 2013-03-02 ENCOUNTER — Telehealth: Payer: Self-pay | Admitting: *Deleted

## 2013-03-02 DIAGNOSIS — F3289 Other specified depressive episodes: Secondary | ICD-10-CM | POA: Diagnosis not present

## 2013-03-02 DIAGNOSIS — F329 Major depressive disorder, single episode, unspecified: Secondary | ICD-10-CM | POA: Diagnosis not present

## 2013-03-02 NOTE — Telephone Encounter (Signed)
Spoke with patient and patient did not know Dr. Sharlett Iles retired. I advised patient that I will have to have her come in and see an extender or another physician for medication refills because Dr. Sharlett Iles retired. Patient said that she is not due to come back until she needs repeat colonoscopy so she will get medication refilled through her primary care doctor.

## 2013-03-02 NOTE — Telephone Encounter (Signed)
Lmom for patient to call office back. Trying to schedule patient appointment so we can refill her medication because Dr. Sharlett Iles retired.

## 2013-03-06 ENCOUNTER — Other Ambulatory Visit: Payer: Self-pay | Admitting: *Deleted

## 2013-03-06 MED ORDER — LANSOPRAZOLE 30 MG PO CPDR: DELAYED_RELEASE_CAPSULE | ORAL | Status: DC

## 2013-03-13 ENCOUNTER — Other Ambulatory Visit: Payer: Self-pay | Admitting: *Deleted

## 2013-03-13 MED ORDER — FLUCONAZOLE 150 MG PO TABS: 150.0000 mg | ORAL_TABLET | Freq: Once | ORAL | Status: DC

## 2013-03-23 DIAGNOSIS — M5126 Other intervertebral disc displacement, lumbar region: Secondary | ICD-10-CM | POA: Diagnosis not present

## 2013-03-23 DIAGNOSIS — M5137 Other intervertebral disc degeneration, lumbosacral region: Secondary | ICD-10-CM | POA: Diagnosis not present

## 2013-03-27 ENCOUNTER — Ambulatory Visit (INDEPENDENT_AMBULATORY_CARE_PROVIDER_SITE_OTHER): Payer: Medicare Other | Admitting: Physician Assistant

## 2013-03-27 ENCOUNTER — Encounter: Payer: Self-pay | Admitting: Physician Assistant

## 2013-03-27 VITALS — BP 116/82 | HR 115 | Wt 112.0 lb

## 2013-03-27 DIAGNOSIS — R3915 Urgency of urination: Secondary | ICD-10-CM

## 2013-03-27 DIAGNOSIS — R000 Tachycardia, unspecified: Secondary | ICD-10-CM

## 2013-03-27 DIAGNOSIS — R300 Dysuria: Secondary | ICD-10-CM

## 2013-03-27 DIAGNOSIS — Z87440 Personal history of urinary (tract) infections: Secondary | ICD-10-CM | POA: Diagnosis not present

## 2013-03-27 DIAGNOSIS — I498 Other specified cardiac arrhythmias: Secondary | ICD-10-CM | POA: Diagnosis not present

## 2013-03-27 LAB — POCT URINALYSIS DIPSTICK
BILIRUBIN UA: NEGATIVE
Blood, UA: NEGATIVE
GLUCOSE UA: NEGATIVE
KETONES UA: NEGATIVE
Leukocytes, UA: NEGATIVE
Nitrite, UA: NEGATIVE
PH UA: 6
Protein, UA: NEGATIVE
Spec Grav, UA: 1.025
Urobilinogen, UA: 0.2

## 2013-03-27 MED ORDER — NITROFURANTOIN MONOHYD MACRO 100 MG PO CAPS: 100.0000 mg | ORAL_CAPSULE | Freq: Two times a day (BID) | ORAL | Status: DC

## 2013-03-27 NOTE — Patient Instructions (Signed)
claritin daily at night.

## 2013-03-30 DIAGNOSIS — R000 Tachycardia, unspecified: Secondary | ICD-10-CM | POA: Insufficient documentation

## 2013-03-30 NOTE — Progress Notes (Signed)
   Subjective:    Patient ID: Mary [name], female    DOB: 1965/02/19, 48 y.o.   MRN: [id number]  HPI Patient is a 48 year old female who presents to the clinic with 5 days of urinary urgency and painful urination. She's been taking cystex OTC all week long. She denies any fever, chills, nausea, vomiting, abdominal pain, changes in bowel movements. She has a history of urinary tract infections but it has been a while since her last one. Numerous years ago she had to have her urethra stretched because it was too small creating urinary tract infections. She reports the symptoms are very consistent with what she would do. She denies any back pain. Patient continually feels like she has to go she does scratch only has a little bit of urine.   Review of Systems     Objective:   Physical Exam  Constitutional: She is oriented to person, place, and time. She appears well-developed and well-nourished.  HENT:  Head: Normocephalic and atraumatic.  Cardiovascular: Regular rhythm and normal heart sounds.   Tachycardia at 1:15  Pulmonary/Chest: Effort normal and breath sounds normal.  Negative for any CVA tenderness  Abdominal: Soft. Bowel sounds are normal.  Some discomfort when palpating bilateral lower quadrant.  Neurological: She is alert and oriented to person, place, and time.  Psychiatric: She has a normal mood and affect. Her behavior is normal.          Assessment & Plan:  Dysuria/urinary frequency- UA negative for leuks, nitrates, blood. We'll send for culture. Patient is very persistent that this is a she feels like with urinary tract infections. Since patient is symptomatic we'll start treatment with Macrobid and wait for culture results to come back. Discussed with patient there are other causes that are not infectious that could be because for her symptoms. If not improving I suggest follow up with [place] or see her urologist.  Sinus tachycardia-discuss with patient this has been  ongoing for the last couple visits. If continues to really need to consider starting a beta blocker to better control pulse rate.

## 2013-04-05 ENCOUNTER — Other Ambulatory Visit: Payer: Self-pay | Admitting: Physician Assistant

## 2013-04-13 DIAGNOSIS — M545 Low back pain, unspecified: Secondary | ICD-10-CM | POA: Diagnosis not present

## 2013-04-13 DIAGNOSIS — M5137 Other intervertebral disc degeneration, lumbosacral region: Secondary | ICD-10-CM | POA: Diagnosis not present

## 2013-04-13 DIAGNOSIS — G894 Chronic pain syndrome: Secondary | ICD-10-CM | POA: Diagnosis not present

## 2013-04-13 DIAGNOSIS — M533 Sacrococcygeal disorders, not elsewhere classified: Secondary | ICD-10-CM | POA: Diagnosis not present

## 2013-04-18 IMAGING — CR DG FOOT 2V*R*
2 series · 2 of 2 positions shown · non-contrast
Comparison: None.

CLINICAL DATA: Medial foot pain and swelling.

RIGHT FOOT - 2 VIEW

[view not recorded (1 of 2)]
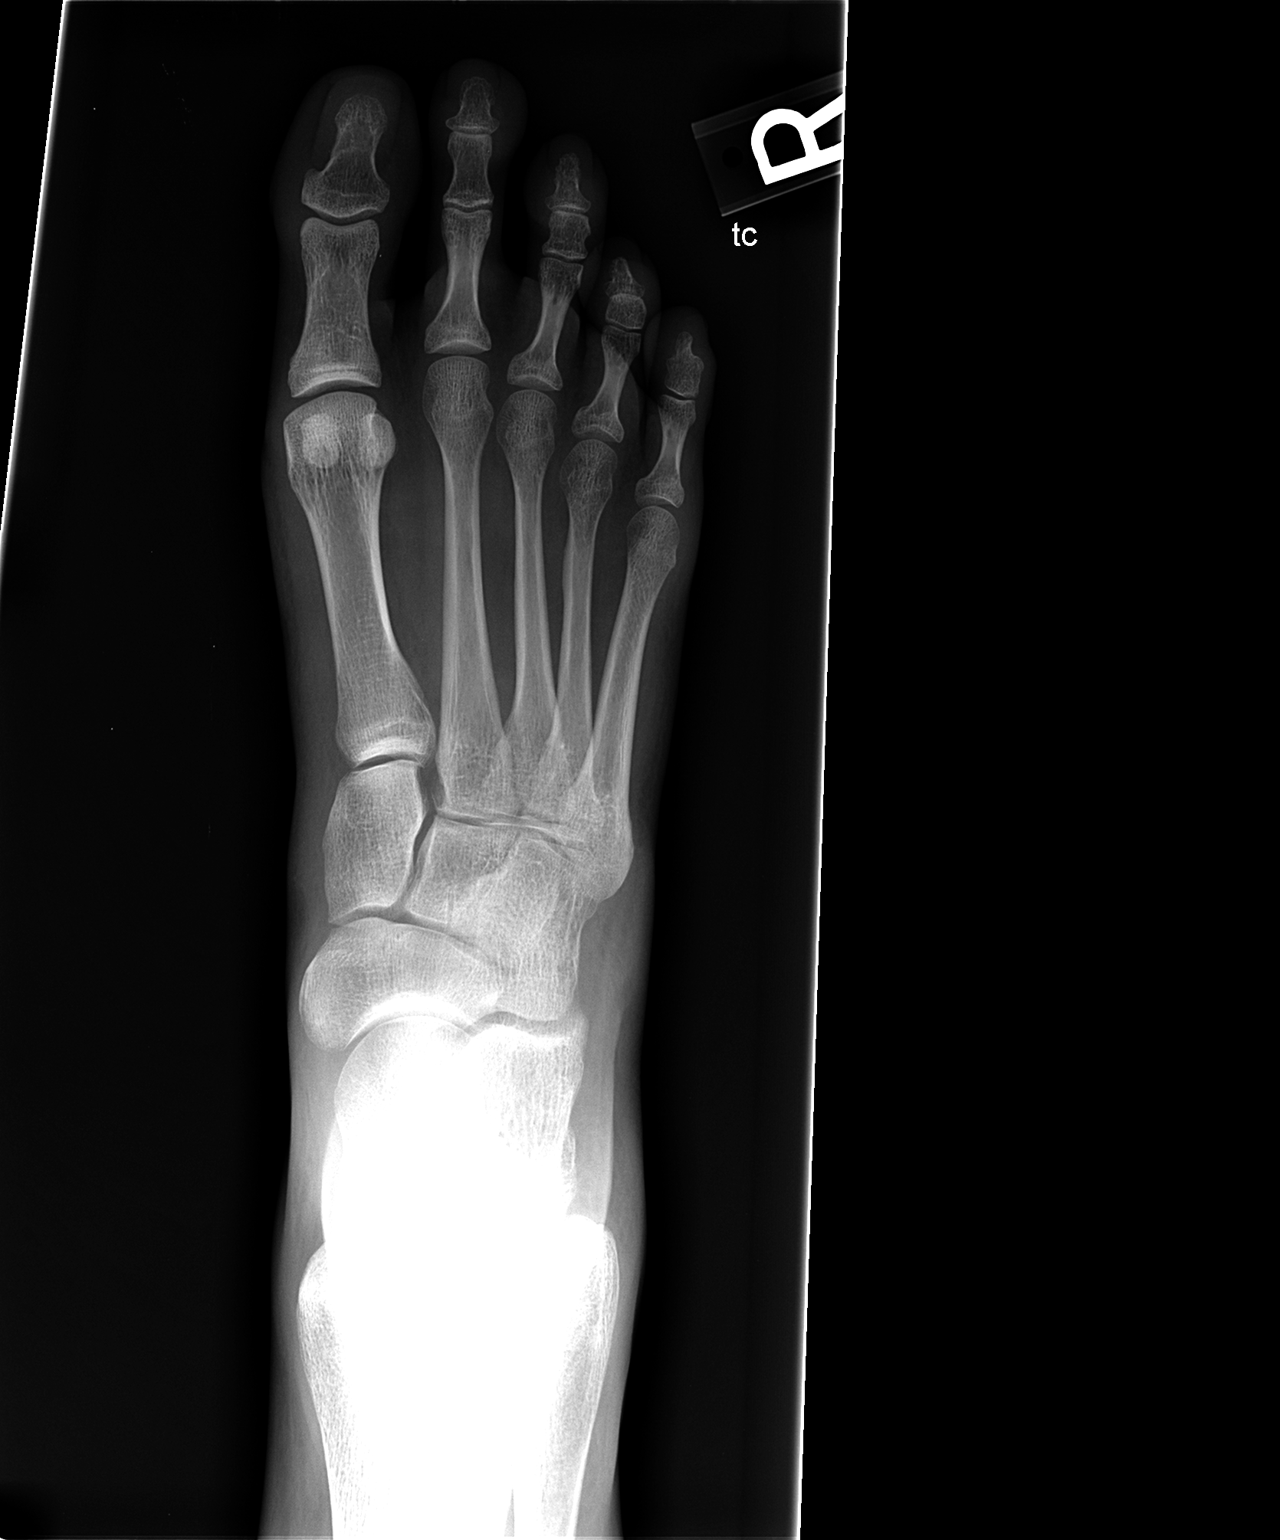

[view not recorded (2 of 2)]
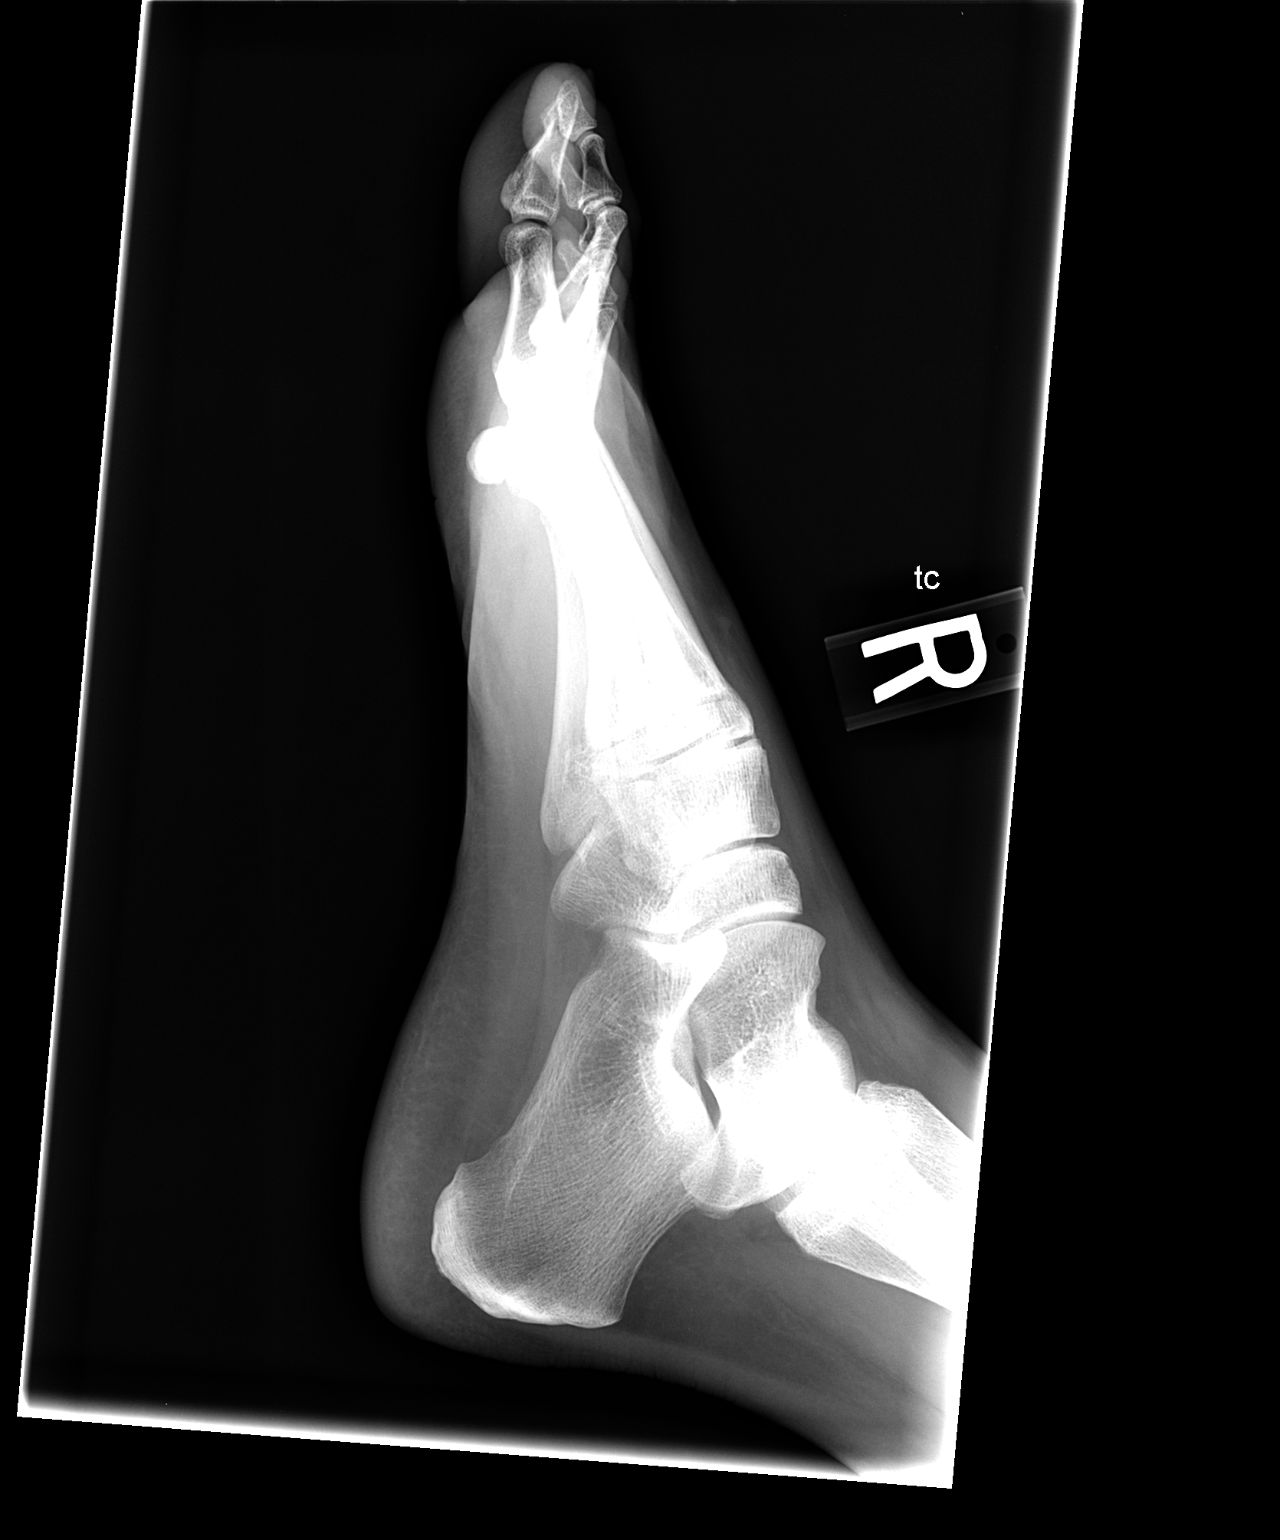

[2 of 2 positions shown; findings below may reference images not displayed]

FINDINGS: Imaged bones, joints and soft tissues appear normal.
IMPRESSION: Normal study.

## 2013-05-04 ENCOUNTER — Other Ambulatory Visit: Payer: Self-pay | Admitting: Physician Assistant

## 2013-05-14 ENCOUNTER — Other Ambulatory Visit: Payer: Self-pay | Admitting: Gastroenterology

## 2013-05-14 NOTE — Telephone Encounter (Signed)
Ok to refill x5, then needs ROV

## 2013-05-14 NOTE — Telephone Encounter (Signed)
Patient had last office visit on 09-09-2012. Patient is requesting refill on Linzess. Is is okay to refill?

## 2013-05-27 DIAGNOSIS — M533 Sacrococcygeal disorders, not elsewhere classified: Secondary | ICD-10-CM | POA: Diagnosis not present

## 2013-05-27 DIAGNOSIS — M461 Sacroiliitis, not elsewhere classified: Secondary | ICD-10-CM | POA: Diagnosis not present

## 2013-06-17 DIAGNOSIS — F3289 Other specified depressive episodes: Secondary | ICD-10-CM | POA: Diagnosis not present

## 2013-06-17 DIAGNOSIS — F329 Major depressive disorder, single episode, unspecified: Secondary | ICD-10-CM | POA: Diagnosis not present

## 2013-06-23 DIAGNOSIS — M545 Low back pain, unspecified: Secondary | ICD-10-CM | POA: Diagnosis not present

## 2013-06-23 DIAGNOSIS — G894 Chronic pain syndrome: Secondary | ICD-10-CM | POA: Diagnosis not present

## 2013-09-16 DIAGNOSIS — M5137 Other intervertebral disc degeneration, lumbosacral region: Secondary | ICD-10-CM | POA: Diagnosis not present

## 2013-09-16 DIAGNOSIS — G894 Chronic pain syndrome: Secondary | ICD-10-CM | POA: Diagnosis not present

## 2013-09-21 DIAGNOSIS — F332 Major depressive disorder, recurrent severe without psychotic features: Secondary | ICD-10-CM | POA: Diagnosis not present

## 2013-11-02 ENCOUNTER — Encounter: Payer: Self-pay | Admitting: Physician Assistant

## 2013-11-03 ENCOUNTER — Other Ambulatory Visit: Payer: Self-pay | Admitting: Family Medicine

## 2013-11-04 ENCOUNTER — Other Ambulatory Visit: Payer: Self-pay

## 2013-11-04 MED ORDER — LANSOPRAZOLE 30 MG PO CPDR: 30.0000 mg | DELAYED_RELEASE_CAPSULE | Freq: Every day | ORAL | Status: DC

## 2013-11-19 ENCOUNTER — Other Ambulatory Visit: Payer: Self-pay | Admitting: Physician Assistant

## 2013-12-01 ENCOUNTER — Encounter: Payer: Self-pay | Admitting: Family Medicine

## 2013-12-01 ENCOUNTER — Ambulatory Visit (INDEPENDENT_AMBULATORY_CARE_PROVIDER_SITE_OTHER): Payer: Medicare Other | Admitting: Family Medicine

## 2013-12-01 ENCOUNTER — Ambulatory Visit (INDEPENDENT_AMBULATORY_CARE_PROVIDER_SITE_OTHER): Payer: Medicare Other | Admitting: *Deleted

## 2013-12-01 VITALS — BP 102/66 | HR 105 | Temp 97.8°F | Wt 105.0 lb

## 2013-12-01 DIAGNOSIS — N3000 Acute cystitis without hematuria: Secondary | ICD-10-CM

## 2013-12-01 DIAGNOSIS — Z23 Encounter for immunization: Secondary | ICD-10-CM | POA: Diagnosis not present

## 2013-12-01 DIAGNOSIS — J069 Acute upper respiratory infection, unspecified: Secondary | ICD-10-CM

## 2013-12-01 LAB — POCT URINALYSIS DIPSTICK
Glucose, UA: NEGATIVE
KETONES UA: NEGATIVE
Nitrite, UA: NEGATIVE
Protein, UA: 100
SPEC GRAV UA: 1.025
Urobilinogen, UA: 0.2
pH, UA: 5.5

## 2013-12-01 MED ORDER — CIPROFLOXACIN HCL 500 MG PO TABS: 500.0000 mg | ORAL_TABLET | Freq: Two times a day (BID) | ORAL | Status: AC

## 2013-12-01 MED ORDER — LANSOPRAZOLE 30 MG PO CPDR: 30.0000 mg | DELAYED_RELEASE_CAPSULE | Freq: Every day | ORAL | Status: DC

## 2013-12-01 MED ORDER — FLUCONAZOLE 150 MG PO TABS: 150.0000 mg | ORAL_TABLET | Freq: Once | ORAL | Status: DC

## 2013-12-01 NOTE — Progress Notes (Signed)
   Subjective:    Patient ID: Mary [name], female    DOB: [birth year], 48 y.o.   MRN: [id number]  HPI Dysuria or pelvic pain started yesterday.  Has been using Cystex. Last UTI was a couple of years ago.  Woke up with drainage and cough with green sputum a few days ago.  No fever.  Has had some sweats and chills.  No change in back pain.  +smoker   Review of Systems     Objective:   Physical Exam  Constitutional: She is oriented to person, place, and time. She appears well-developed and well-nourished.  HENT:  Head: Normocephalic and atraumatic.  Right Ear: External ear normal.  Left Ear: External ear normal.  Nose: Nose normal.  Mouth/Throat: Oropharynx is clear and moist.  TMs and canals are clear.   Eyes: Conjunctivae and EOM are normal. Pupils are equal, round, and reactive to light.  Neck: Neck supple. No thyromegaly present.  Cardiovascular: Normal rate, regular rhythm and normal heart sounds.   Pulmonary/Chest: Effort normal and breath sounds normal. She has no wheezes.  Lymphadenopathy:    She has no cervical adenopathy.  Neurological: She is alert and oriented to person, place, and time.  Skin: Skin is warm and dry.  Psychiatric: She has a normal mood and affect.          Assessment & Plan:  UTI - will treat with Cipro since she does have a sulfa allergy. Call not significantly better by the end of the week. Make sure taking plenty of fluids.  URI - likely viral. She's had symptoms for about 3 days at this point. Recommend symptom Medicare. Call if not better in one week.

## 2013-12-01 NOTE — Patient Instructions (Signed)

## 2013-12-03 ENCOUNTER — Telehealth: Payer: Self-pay

## 2013-12-03 NOTE — Telephone Encounter (Signed)
Mary [name] reports she is still having frequent and painful urination. There is just a little improvement. She is worried due to the fact the Cipro is only for 3 days. Please advise.

## 2013-12-04 MED ORDER — CEPHALEXIN 500 MG PO CAPS
500.0000 mg | ORAL_CAPSULE | Freq: Three times a day (TID) | ORAL | Status: DC
Start: 2013-12-04 — End: 2014-06-01

## 2013-12-04 NOTE — Telephone Encounter (Signed)
3 days is the standard treatment unless has a fever or has had a catheter, etc which put them at risk of complications.

## 2013-12-04 NOTE — Telephone Encounter (Signed)
Mary [name] reports she has a fever, comes and goes the last few days. Dr Madilyn Fireman gave verbal order for Keflex 500 mg TID for 5 days.

## 2013-12-09 DIAGNOSIS — G894 Chronic pain syndrome: Secondary | ICD-10-CM | POA: Diagnosis not present

## 2013-12-09 DIAGNOSIS — M545 Low back pain: Secondary | ICD-10-CM | POA: Diagnosis not present

## 2013-12-09 DIAGNOSIS — M5417 Radiculopathy, lumbosacral region: Secondary | ICD-10-CM | POA: Diagnosis not present

## 2013-12-15 DIAGNOSIS — F332 Major depressive disorder, recurrent severe without psychotic features: Secondary | ICD-10-CM | POA: Diagnosis not present

## 2014-02-18 DIAGNOSIS — M545 Low back pain: Secondary | ICD-10-CM | POA: Diagnosis not present

## 2014-02-18 DIAGNOSIS — G8929 Other chronic pain: Secondary | ICD-10-CM | POA: Diagnosis not present

## 2014-02-18 DIAGNOSIS — M5136 Other intervertebral disc degeneration, lumbar region: Secondary | ICD-10-CM | POA: Diagnosis not present

## 2014-02-18 DIAGNOSIS — M533 Sacrococcygeal disorders, not elsewhere classified: Secondary | ICD-10-CM | POA: Diagnosis not present

## 2014-02-18 DIAGNOSIS — M5416 Radiculopathy, lumbar region: Secondary | ICD-10-CM | POA: Diagnosis not present

## 2014-02-18 DIAGNOSIS — G894 Chronic pain syndrome: Secondary | ICD-10-CM | POA: Diagnosis not present

## 2014-03-13 ENCOUNTER — Other Ambulatory Visit: Payer: Self-pay | Admitting: Physician Assistant

## 2014-03-15 DIAGNOSIS — F332 Major depressive disorder, recurrent severe without psychotic features: Secondary | ICD-10-CM | POA: Diagnosis not present

## 2014-03-16 ENCOUNTER — Other Ambulatory Visit: Payer: Self-pay | Admitting: *Deleted

## 2014-03-16 ENCOUNTER — Telehealth: Payer: Self-pay | Admitting: *Deleted

## 2014-03-16 MED ORDER — LINACLOTIDE 290 MCG PO CAPS: 290.0000 ug | ORAL_CAPSULE | Freq: Every day | ORAL | Status: DC

## 2014-03-16 NOTE — Telephone Encounter (Signed)
LM for the patient and advised her that we did send one month refill for the Linzess but within the 30 days, she will have to call us and make an appointment to choose another provider in our office.  We will not refill this Linzess prescription again until she is seen here in our office.

## 2014-04-15 DIAGNOSIS — Z79899 Other long term (current) drug therapy: Secondary | ICD-10-CM | POA: Diagnosis not present

## 2014-04-15 DIAGNOSIS — Z5181 Encounter for therapeutic drug level monitoring: Secondary | ICD-10-CM | POA: Diagnosis not present

## 2014-04-15 DIAGNOSIS — M5416 Radiculopathy, lumbar region: Secondary | ICD-10-CM | POA: Diagnosis not present

## 2014-04-15 DIAGNOSIS — G894 Chronic pain syndrome: Secondary | ICD-10-CM | POA: Diagnosis not present

## 2014-04-15 DIAGNOSIS — M5136 Other intervertebral disc degeneration, lumbar region: Secondary | ICD-10-CM | POA: Diagnosis not present

## 2014-04-22 DIAGNOSIS — G894 Chronic pain syndrome: Secondary | ICD-10-CM | POA: Diagnosis not present

## 2014-06-01 ENCOUNTER — Emergency Department (INDEPENDENT_AMBULATORY_CARE_PROVIDER_SITE_OTHER)
Admission: EM | Admit: 2014-06-01 | Discharge: 2014-06-01 | Disposition: A | Discharge status: Home / Self Care | Payer: Medicare Other | Source: Home / Self Care | Attending: Emergency Medicine | Admitting: Emergency Medicine

## 2014-06-01 ENCOUNTER — Encounter: Payer: Self-pay | Admitting: *Deleted

## 2014-06-01 DIAGNOSIS — R300 Dysuria: Secondary | ICD-10-CM

## 2014-06-01 MED ORDER — CEPHALEXIN 500 MG PO CAPS: 500.0000 mg | ORAL_CAPSULE | Freq: Four times a day (QID) | ORAL | Status: DC

## 2014-06-01 MED ORDER — FLUCONAZOLE 150 MG PO TABS: 150.0000 mg | ORAL_TABLET | Freq: Every day | ORAL | Status: DC

## 2014-06-01 NOTE — Discharge Instructions (Signed)

## 2014-06-01 NOTE — ED Notes (Signed)
Pt c/o 3 days of dysuria, bladder pain, and polyuria. H/o frequent UTI

## 2014-06-01 NOTE — ED Provider Notes (Signed)
CSN: [id number]     Arrival date & time 06/01/14  1521 History   First MD Initiated Contact with Patient 06/01/14 1545     Chief Complaint  Patient presents with  . Urinary Tract Infection   (Consider location/radiation/quality/duration/timing/severity/associated sxs/prior Treatment) Patient is a 49 y.o. female presenting with urinary tract infection. The history is provided by the patient. No language interpreter was used.  Urinary Tract Infection Pain quality:  Aching, sharp and shooting Pain severity:  Mild Onset quality:  Gradual Duration:  2 days Timing:  Constant Progression:  Worsening Chronicity:  New Recent urinary tract infections: yes   Relieved by:  Nothing Worsened by:  Nothing tried Ineffective treatments:  None tried Urinary symptoms: frequent urination   Associated symptoms: no abdominal pain and no fever   Risk factors: no urinary catheter     Past Medical History  Diagnosis Date  . MVP (mitral valve prolapse)   . Herniated disc   . Personal history of colonic polyps 06/26/2007    tubular adenoma  . Complication of anesthesia     "hard to put to sleep", woke up during procedure in the past  . SVT (supraventricular tachycardia)     hx of ablation  . PAF (paroxysmal atrial fibrillation)   . Mental disorder     PTSD  . Anxiety   . Shortness of breath     Due to Dilaudid and Morphine  . Diverticulosis of colon (without mention of hemorrhage)    Past Surgical History  Procedure Laterality Date  . Tonsillectomy    . Urethra surgery    . Cardiac electrophysiology mapping and ablation    . Colonoscopy    . Back surgery      several  . Endometrial ablation  04/19/11  . Cardiac electrophysiology mapping and ablation  12/2011   Family History  Problem Relation Age of Onset  . Stomach cancer Paternal Aunt   . Coronary artery disease Father   . Hypertension Father   . Colon cancer Father   . Neuropathy Mother    History  Substance Use Topics  .  Smoking status: Current Every Day Smoker -- 0.80 packs/day for 28 years    Types: Cigarettes  . Smokeless tobacco: Never Used     Comment: started smoking at age 36. Counseling sheet given in exam room to quit smoking 02-13-2011  . Alcohol Use: Yes     Comment: rarely   OB History    Gravida Para Term Preterm AB TAB SAB Ectopic Multiple Living   1    1 1          Review of Systems  Constitutional: Negative for fever.  Gastrointestinal: Negative for abdominal pain.  All other systems reviewed and are negative.   Allergies  Albuterol; Atenolol; Benzoyl peroxide; Codeine; Cyclobenzaprine hcl; Diphenhydramine hcl; Erythromycin; Fentanyl; Remeron; and Sulfonamide derivatives  Home Medications   Prior to Admission medications   Medication Sig Start Date End Date Taking? Authorizing Provider  cephALEXin (KEFLEX) 500 MG capsule Take 1 capsule (500 mg total) by mouth 4 (four) times daily. 06/01/14   Fransico Meadow, PA-C  fluconazole (DIFLUCAN) 150 MG tablet Take 1 tablet (150 mg total) by mouth daily. 06/01/14   Fransico Meadow, PA-C  lansoprazole (PREVACID) 30 MG capsule Take 1 capsule (30 mg total) by mouth daily. 12/01/13   Hali Marry, MD  Linaclotide Rolan Lipa) 290 MCG CAPS capsule Take 1 capsule (290 mcg total) by mouth daily. 03/16/14   Amy  S Esterwood, PA-C  LORazepam (ATIVAN) 1 MG tablet Take 1 mg by mouth at bedtime.    Historical Provider, MD  methylphenidate (RITALIN) 10 MG tablet Take 30 mg by mouth 2 (two) times daily.    Historical Provider, MD  metoprolol succinate (TOPROL-XL) 25 MG 24 hr tablet Take 25 mg by mouth daily.    Historical Provider, MD  midodrine (PROAMATINE) 2.5 MG tablet Take 2.5 mg by mouth daily.    Historical Provider, MD  nitrofurantoin, macrocrystal-monohydrate, (MACROBID) 100 MG capsule Take 1 capsule (100 mg total) by mouth 2 (two) times daily. 03/27/13   Jade L Breeback, PA-C  oxymorphone (OPANA ER) 20 MG 12 hr tablet Take 20 mg by mouth every 12  (twelve) hours.    Historical Provider, MD   BP 149/74 mmHg  Pulse 119  Temp(Src) 98.2 F (36.8 C) (Oral)  Resp 18  Wt 110 lb (49.896 kg)  SpO2 100% Physical Exam  Constitutional: She is oriented to person, place, and time. She appears well-developed and well-nourished.  HENT:  Head: Normocephalic.  Eyes: EOM are normal.  Neck: Normal range of motion.  Cardiovascular: Normal rate.   Pulmonary/Chest: Effort normal.  Abdominal: Soft. She exhibits no distension.  Musculoskeletal: Normal range of motion.  Neurological: She is alert and oriented to person, place, and time.  Psychiatric: She has a normal mood and affect.  Nursing note and vitals reviewed.  Urine dip deferred, Pt on pyridium.   Pt has frequent uti's ED Course  Procedures (including critical care time) Labs Review Labs Reviewed  URINE CULTURE    Imaging Review No results found.   MDM   1. Dysuria    Keflex Diflucan AVS    Fransico Meadow, PA-C 06/01/14 1611

## 2014-06-03 LAB — URINE CULTURE

## 2014-06-04 ENCOUNTER — Telehealth: Payer: Self-pay | Admitting: Emergency Medicine

## 2014-06-09 DIAGNOSIS — F332 Major depressive disorder, recurrent severe without psychotic features: Secondary | ICD-10-CM | POA: Diagnosis not present

## 2014-06-10 DIAGNOSIS — M533 Sacrococcygeal disorders, not elsewhere classified: Secondary | ICD-10-CM | POA: Diagnosis not present

## 2014-06-10 DIAGNOSIS — G894 Chronic pain syndrome: Secondary | ICD-10-CM | POA: Diagnosis not present

## 2014-06-10 DIAGNOSIS — M5136 Other intervertebral disc degeneration, lumbar region: Secondary | ICD-10-CM | POA: Diagnosis not present

## 2014-06-10 DIAGNOSIS — M5416 Radiculopathy, lumbar region: Secondary | ICD-10-CM | POA: Diagnosis not present

## 2014-06-10 DIAGNOSIS — G8929 Other chronic pain: Secondary | ICD-10-CM | POA: Diagnosis not present

## 2014-06-10 DIAGNOSIS — M545 Low back pain: Secondary | ICD-10-CM | POA: Diagnosis not present

## 2014-08-30 DIAGNOSIS — G894 Chronic pain syndrome: Secondary | ICD-10-CM | POA: Diagnosis not present

## 2014-08-30 DIAGNOSIS — M5442 Lumbago with sciatica, left side: Secondary | ICD-10-CM | POA: Diagnosis not present

## 2014-08-30 DIAGNOSIS — M5417 Radiculopathy, lumbosacral region: Secondary | ICD-10-CM | POA: Diagnosis not present

## 2014-08-30 DIAGNOSIS — M5441 Lumbago with sciatica, right side: Secondary | ICD-10-CM | POA: Diagnosis not present

## 2014-10-04 ENCOUNTER — Encounter: Payer: Self-pay | Admitting: Osteopathic Medicine

## 2014-10-04 ENCOUNTER — Ambulatory Visit (INDEPENDENT_AMBULATORY_CARE_PROVIDER_SITE_OTHER): Payer: Medicare Other | Admitting: Osteopathic Medicine

## 2014-10-04 ENCOUNTER — Ambulatory Visit (INDEPENDENT_AMBULATORY_CARE_PROVIDER_SITE_OTHER): Payer: Medicare Other

## 2014-10-04 VITALS — BP 139/90 | HR 108 | Temp 98.0°F | Wt 122.0 lb

## 2014-10-04 DIAGNOSIS — J209 Acute bronchitis, unspecified: Secondary | ICD-10-CM

## 2014-10-04 DIAGNOSIS — R079 Chest pain, unspecified: Secondary | ICD-10-CM

## 2014-10-04 DIAGNOSIS — Z8709 Personal history of other diseases of the respiratory system: Secondary | ICD-10-CM

## 2014-10-04 DIAGNOSIS — Z720 Tobacco use: Secondary | ICD-10-CM | POA: Diagnosis not present

## 2014-10-04 DIAGNOSIS — R059 Cough, unspecified: Secondary | ICD-10-CM

## 2014-10-04 DIAGNOSIS — R509 Fever, unspecified: Secondary | ICD-10-CM

## 2014-10-04 DIAGNOSIS — Z8619 Personal history of other infectious and parasitic diseases: Secondary | ICD-10-CM

## 2014-10-04 DIAGNOSIS — R05 Cough: Secondary | ICD-10-CM

## 2014-10-04 DIAGNOSIS — Z716 Tobacco abuse counseling: Secondary | ICD-10-CM

## 2014-10-04 MED ORDER — AZITHROMYCIN 250 MG PO TABS: ORAL_TABLET | ORAL | Status: DC

## 2014-10-04 NOTE — Patient Instructions (Signed)
Your Chest xray indicates lung disease from smoking. I recommend following up with Dr Madilyn Fireman for lung function tests to determine the extent of the problem and see if you might benefit from inhalers. We will treat for bronchitis, take all antibiotics even if you are feeling better. You can take ibuprofen/tylenol for the pain in the ribs which is likely simple inflammation of muscles/ribs. You NEED TO QUIT SMOKING to reduce your risk of lung disease, cardiovascular disease and early death.

## 2014-10-04 NOTE — Progress Notes (Signed)
HPI: Mary [name] is a 49 y.o. female who presents to [place]  today for chief complaint of:  Chief Complaint  Patient presents with  . Cough    . Location: R chest below breast hurts with breathing in, coughing . Quality: nonproducitve w/ occasional productive . Severity: mild to moderate . Duration: >1 month . Timing: worse in morning . Context: had flu one month ago . Assoc signs/symptoms: fever comes and goes   Past medical, social and family history reviewed: Past Medical History  Diagnosis Date  . MVP (mitral valve prolapse)   . Herniated disc   . Personal history of colonic polyps 06/26/2007    tubular adenoma  . Complication of anesthesia     "hard to put to sleep", woke up during procedure in the past  . SVT (supraventricular tachycardia) (HCC)     hx of ablation  . PAF (paroxysmal atrial fibrillation) ([place])   . Mental disorder     PTSD  . Anxiety   . Shortness of breath     Due to Dilaudid and Morphine  . Diverticulosis of colon (without mention of hemorrhage)    Past Surgical History  Procedure Laterality Date  . Tonsillectomy    . Urethra surgery    . Cardiac electrophysiology mapping and ablation    . Colonoscopy    . Back surgery      several  . Endometrial ablation  04/19/11  . Cardiac electrophysiology mapping and ablation  12/2011   Social History  Substance Use Topics  . Smoking status: Current Every Day Smoker -- 0.80 packs/day for 28 years    Types: Cigarettes  . Smokeless tobacco: Never Used     Comment: started smoking at age 10. Counseling sheet given in exam room to quit smoking 02-13-2011  . Alcohol Use: Yes     Comment: rarely   Family History  Problem Relation Age of Onset  . Stomach cancer Paternal Aunt   . Coronary artery disease Father   . Hypertension Father   . Colon cancer Father   . Neuropathy Mother     Current Outpatient Prescriptions  Medication Sig Dispense Refill  .  lansoprazole (PREVACID) 30 MG capsule Take 1 capsule (30 mg total) by mouth daily. 90 capsule 3  . Linaclotide (LINZESS) 290 MCG CAPS capsule Take 1 capsule (290 mcg total) by mouth daily. 30 capsule 0  . LORazepam (ATIVAN) 1 MG tablet Take 1 mg by mouth at bedtime.    . methylphenidate (RITALIN) 10 MG tablet Take 30 mg by mouth 2 (two) times daily.    . metoprolol succinate (TOPROL-XL) 25 MG 24 hr tablet Take 25 mg by mouth daily.    . midodrine (PROAMATINE) 2.5 MG tablet Take 2.5 mg by mouth daily.    Marland Kitchen oxymorphone (OPANA ER) 20 MG 12 hr tablet Take 20 mg by mouth every 12 (twelve) hours.     No current facility-administered medications for this visit.   Allergies  Allergen Reactions  . Albuterol Other (See Comments)    REACTION: shakey, tachycardic, arrythmia, chest pain  . Atenolol Nausea And Vomiting    Decreased BP  . Benzoyl Peroxide Swelling    Topical--eyes swell shut  . Codeine Other (See Comments)    Arrythmia but can take percocet  . Cyclobenzaprine Hcl Nausea Only  . Diphenhydramine Hcl Other (See Comments)    "drugged feeling"  . Erythromycin Nausea And Vomiting  . Fentanyl Other (See Comments)  Patch -made her get low BP,  Can get IV w/o problem  . Remeron [Mirtazapine] Itching    Swelling of tongue, jerking, can't wake up, feel drugged  . Sulfonamide Derivatives Swelling and Rash    Itching swelling visit ER      Review of Systems: CONSTITUTIONAL: Neg fever/chills, no unintentional weight changes HEAD/EYES/EARS/NOSE/THROAT: No headache/vision change or hearing change, no sore throat, no sinus pressure CARDIAC: No chest pain/pressure/palpitations, no orthopnea RESPIRATORY: (+) cough/shortness of breath/wheeze GASTROINTESTINAL: No nausea/vomiting/abdominal pain MUSCULOSKELETAL: (+) chronic myalgia/arthralgia SKIN: No rash/wounds/concerning lesions HEM/ONC: No easy bruising/bleeding, no abnormal lymph node, (+) fever at home   Exam:  BP 139/90 mmHg  Pulse  108  Temp(Src) 98 F (36.7 C) (Oral)  Wt 122 lb (55.339 kg)  SpO2 100% Constitutional: VSS, see above. General Appearance: alert, well-developed, well-nourished, NAD Eyes: Normal lids and conjunctive, non-icteric sclera, PERRLA Neck: No masses, trachea midline. No thyroid enlargement/tenderness/mass appreciated Respiratory: Normal respiratory effort. no wheeze/rhonchi/rales, dominished breath sounds bilaterally all fields Cardiovascular: S1/S2 normal, no murmur/rub/gallop auscultated. RRR 90 bpm.  Musculoskeletal: Gait normal. No clubbing/cyanosis of digits.     No results found for this or any previous visit (from the past 72 hour(s)).    ASSESSMENT/PLAN:  Cough - Plan: DG Chest 2 View pe rpersonal review appears clear, markings c/w COPD, no mass/infiltrate or distinct PNA seen, Radiology read reviewed and nothing to add  History of influenza - 1 month ago per patient, cough may be residual  Tobacco abuse counseling  Tobacco abuse - has had CT, may benefit from continued screening with low dose CT chest annual, will defer to PCP for discussion on this  Fever, unspecified fever cause - per patient, afebrile today, may consider bloodwork but VSS and do not suspect sepsis  Acute bronchitis, unspecified organism - Plan: azithromycin (ZITHROMAX) 250 MG tablet    F/u with Dr Madilyn Fireman - likely untreated COPD, would recommend PFTs if feeling better

## 2014-10-05 ENCOUNTER — Telehealth: Payer: Self-pay

## 2014-10-05 MED ORDER — BENZONATATE 200 MG PO CAPS: 200.0000 mg | ORAL_CAPSULE | Freq: Two times a day (BID) | ORAL | Status: DC | PRN

## 2014-10-05 NOTE — Telephone Encounter (Signed)
Sent!

## 2014-10-05 NOTE — Telephone Encounter (Signed)
Mary [name] states she did not receive the cough medication as advised.

## 2014-10-05 NOTE — Telephone Encounter (Signed)
Patient advised.

## 2014-10-07 DIAGNOSIS — R000 Tachycardia, unspecified: Secondary | ICD-10-CM | POA: Diagnosis not present

## 2014-10-07 DIAGNOSIS — I471 Supraventricular tachycardia: Secondary | ICD-10-CM | POA: Diagnosis not present

## 2014-10-07 DIAGNOSIS — I255 Ischemic cardiomyopathy: Secondary | ICD-10-CM | POA: Diagnosis not present

## 2014-10-07 DIAGNOSIS — R9431 Abnormal electrocardiogram [ECG] [EKG]: Secondary | ICD-10-CM | POA: Diagnosis not present

## 2014-10-07 DIAGNOSIS — I517 Cardiomegaly: Secondary | ICD-10-CM | POA: Diagnosis not present

## 2014-10-07 DIAGNOSIS — Z9889 Other specified postprocedural states: Secondary | ICD-10-CM | POA: Diagnosis not present

## 2014-10-07 DIAGNOSIS — Q211 Atrial septal defect: Secondary | ICD-10-CM | POA: Diagnosis not present

## 2014-10-11 ENCOUNTER — Encounter: Payer: Self-pay | Admitting: Family Medicine

## 2014-10-11 ENCOUNTER — Ambulatory Visit (INDEPENDENT_AMBULATORY_CARE_PROVIDER_SITE_OTHER): Payer: Medicare Other | Admitting: Family Medicine

## 2014-10-11 VITALS — BP 120/72 | HR 106 | Temp 98.5°F | Wt 122.0 lb

## 2014-10-11 DIAGNOSIS — J209 Acute bronchitis, unspecified: Secondary | ICD-10-CM | POA: Diagnosis not present

## 2014-10-11 NOTE — Progress Notes (Signed)
   Subjective:    Patient ID: Mary [name], female    DOB: 02/15/1965, 49 y.o.   MRN: [id number]  HPI  F/U for bronchitis.  Has had sxs for one month.  Still coughing and has some congestion in her throat.  She is some better after after a zpack.  Has been having some low grade intermittant fevers.  She still smokes.  She feels she is 50% better.  Still using teh tessalone perles and that helps.    SAw Cardiology last week and they are referring her to Neurology for possible min-stroke.     Review of Systems     Objective:   Physical Exam  Constitutional: She is oriented to person, place, and time. She appears well-developed and well-nourished.  HENT:  Head: Normocephalic and atraumatic.  Eyes: Conjunctivae and EOM are normal.  Cardiovascular: Normal rate.   Pulmonary/Chest: Effort normal.  Neurological: She is alert and oriented to person, place, and time.  Skin: Skin is dry. No pallor.  Psychiatric: She has a normal mood and affect. Her behavior is normal.          Assessment & Plan:  Bronchitis -  She is feeling at least 50% better after taking the azithromycin. Consider other diagnoses such as pertussis. Chest x-ray did not show any pneumonia but did show some scarring. Recommend she follow-up in about 2 weeks for spirometry. There is a spirometry back from 2012 in the computer system that she did with Dr. Loyal Gambler. Lung exam is clear.

## 2014-10-26 DIAGNOSIS — Z5181 Encounter for therapeutic drug level monitoring: Secondary | ICD-10-CM | POA: Diagnosis not present

## 2014-10-26 DIAGNOSIS — Z79899 Other long term (current) drug therapy: Secondary | ICD-10-CM | POA: Diagnosis not present

## 2014-10-26 DIAGNOSIS — M5417 Radiculopathy, lumbosacral region: Secondary | ICD-10-CM | POA: Diagnosis not present

## 2014-10-26 DIAGNOSIS — M5442 Lumbago with sciatica, left side: Secondary | ICD-10-CM | POA: Diagnosis not present

## 2014-10-26 DIAGNOSIS — G894 Chronic pain syndrome: Secondary | ICD-10-CM | POA: Diagnosis not present

## 2014-10-26 DIAGNOSIS — M5441 Lumbago with sciatica, right side: Secondary | ICD-10-CM | POA: Diagnosis not present

## 2014-10-28 DIAGNOSIS — Q211 Atrial septal defect: Secondary | ICD-10-CM | POA: Diagnosis not present

## 2014-10-28 DIAGNOSIS — R200 Anesthesia of skin: Secondary | ICD-10-CM | POA: Diagnosis not present

## 2014-10-28 DIAGNOSIS — Z9189 Other specified personal risk factors, not elsewhere classified: Secondary | ICD-10-CM | POA: Diagnosis not present

## 2014-10-28 DIAGNOSIS — G459 Transient cerebral ischemic attack, unspecified: Secondary | ICD-10-CM | POA: Diagnosis not present

## 2014-11-03 DIAGNOSIS — I6529 Occlusion and stenosis of unspecified carotid artery: Secondary | ICD-10-CM | POA: Diagnosis not present

## 2014-11-03 DIAGNOSIS — G459 Transient cerebral ischemic attack, unspecified: Secondary | ICD-10-CM | POA: Diagnosis not present

## 2014-11-03 DIAGNOSIS — R200 Anesthesia of skin: Secondary | ICD-10-CM | POA: Diagnosis not present

## 2014-11-22 ENCOUNTER — Other Ambulatory Visit: Payer: Self-pay | Admitting: Family Medicine

## 2014-11-22 DIAGNOSIS — M5442 Lumbago with sciatica, left side: Secondary | ICD-10-CM | POA: Diagnosis not present

## 2014-11-22 DIAGNOSIS — M5417 Radiculopathy, lumbosacral region: Secondary | ICD-10-CM | POA: Diagnosis not present

## 2014-11-22 DIAGNOSIS — G894 Chronic pain syndrome: Secondary | ICD-10-CM | POA: Diagnosis not present

## 2014-11-22 DIAGNOSIS — M5441 Lumbago with sciatica, right side: Secondary | ICD-10-CM | POA: Diagnosis not present

## 2014-12-06 DIAGNOSIS — F331 Major depressive disorder, recurrent, moderate: Secondary | ICD-10-CM | POA: Diagnosis not present

## 2014-12-06 DIAGNOSIS — F4310 Post-traumatic stress disorder, unspecified: Secondary | ICD-10-CM | POA: Diagnosis not present

## 2014-12-20 DIAGNOSIS — G894 Chronic pain syndrome: Secondary | ICD-10-CM | POA: Diagnosis not present

## 2014-12-20 DIAGNOSIS — M5441 Lumbago with sciatica, right side: Secondary | ICD-10-CM | POA: Diagnosis not present

## 2014-12-20 DIAGNOSIS — M5442 Lumbago with sciatica, left side: Secondary | ICD-10-CM | POA: Diagnosis not present

## 2014-12-20 DIAGNOSIS — M5417 Radiculopathy, lumbosacral region: Secondary | ICD-10-CM | POA: Diagnosis not present

## 2015-01-01 ENCOUNTER — Emergency Department (INDEPENDENT_AMBULATORY_CARE_PROVIDER_SITE_OTHER)
Admission: EM | Admit: 2015-01-01 | Discharge: 2015-01-01 | Disposition: A | Discharge status: Home / Self Care | Payer: Medicare Other | Source: Home / Self Care | Attending: Family Medicine | Admitting: Family Medicine

## 2015-01-01 ENCOUNTER — Encounter: Payer: Self-pay | Admitting: Emergency Medicine

## 2015-01-01 DIAGNOSIS — R3915 Urgency of urination: Secondary | ICD-10-CM | POA: Diagnosis not present

## 2015-01-01 DIAGNOSIS — R350 Frequency of micturition: Secondary | ICD-10-CM | POA: Diagnosis not present

## 2015-01-01 DIAGNOSIS — R300 Dysuria: Secondary | ICD-10-CM | POA: Diagnosis not present

## 2015-01-01 LAB — POCT URINALYSIS DIP (MANUAL ENTRY)
Blood, UA: NEGATIVE
Glucose, UA: NEGATIVE
Ketones, POC UA: NEGATIVE
Leukocytes, UA: NEGATIVE
Nitrite, UA: NEGATIVE
Protein Ur, POC: NEGATIVE
Spec Grav, UA: 1.025 (ref 1.005–1.03)
Urobilinogen, UA: 0.2 (ref 0–1)
pH, UA: 6 (ref 5–8)

## 2015-01-01 MED ORDER — FLUCONAZOLE 150 MG PO TABS: 150.0000 mg | ORAL_TABLET | Freq: Once | ORAL | Status: DC

## 2015-01-01 MED ORDER — NITROFURANTOIN MONOHYD MACRO 100 MG PO CAPS: 100.0000 mg | ORAL_CAPSULE | Freq: Two times a day (BID) | ORAL | Status: DC

## 2015-01-01 NOTE — Discharge Instructions (Signed)
°  Please take antibiotics as prescribed and be sure to complete entire course even if you start to feel better to ensure infection does not come back. ° °

## 2015-01-01 NOTE — ED Notes (Signed)
Pt c/o dysuria, frequency, urgency x 2 days.

## 2015-01-01 NOTE — ED Provider Notes (Signed)
CSN: [id number]     Arrival date & time 01/01/15  1350 History   First MD Initiated Contact with Patient 01/01/15 1421     Chief Complaint  Patient presents with  . Urinary Tract Infection   (Consider location/radiation/quality/duration/timing/severity/associated sxs/prior Treatment) HPI  Pt is a 49yo female with hx of recurrent UTIs, presenting to [facility] with c/u 2 day hx of dysuria, urinary frequency and urinary urgency.  She has been taking Cystex and Uristat without relief.  Pt notes her last UTI was about 6 months ago.  She was treated with states she was treated with Keflex but needed to take the entire 7 day course before having symptomatic relief.  She has had macrobid in the past with better results.  Denies fever, chills, n/v/d. She reports having recurrent UTIs due to having a small urethra which was dilated in 2006. Episodes of UTIs have decreased significantly but she still gets them occasionally.   Past Medical History  Diagnosis Date  . MVP (mitral valve prolapse)   . Herniated disc   . Personal history of colonic polyps 06/26/2007    tubular adenoma  . Complication of anesthesia     "hard to put to sleep", woke up during procedure in the past  . SVT (supraventricular tachycardia) ([facility])     hx of ablation  . PAF (paroxysmal atrial fibrillation) ([place])   . Mental disorder     PTSD  . Anxiety   . Shortness of breath     Due to Dilaudid and Morphine  . Diverticulosis of colon (without mention of hemorrhage)    Past Surgical History  Procedure Laterality Date  . Tonsillectomy    . Urethra surgery    . Cardiac electrophysiology mapping and ablation    . Colonoscopy    . Back surgery      several  . Endometrial ablation  04/19/11  . Cardiac electrophysiology mapping and ablation  12/2011   Family History  Problem Relation Age of Onset  . Stomach cancer Paternal Aunt   . Coronary artery disease Father   . Hypertension Father   . Colon cancer Father   . Neuropathy  Mother    Social History  Substance Use Topics  . Smoking status: Current Every Day Smoker -- 0.80 packs/day for 28 years    Types: Cigarettes  . Smokeless tobacco: Never Used     Comment: started smoking at age 24. Counseling sheet given in exam room to quit smoking 02-13-2011  . Alcohol Use: Yes     Comment: rarely   OB History    Gravida Para Term Preterm AB TAB SAB Ectopic Multiple Living   1    1 1          Review of Systems  Constitutional: Negative for fever and chills.  Gastrointestinal: Negative for nausea, vomiting, abdominal pain and diarrhea.  Genitourinary: Positive for dysuria, urgency, frequency and decreased urine volume. Negative for hematuria, flank pain, vaginal bleeding, vaginal discharge, vaginal pain, menstrual problem and pelvic pain.  Musculoskeletal: Positive for back pain ( chronic). Negative for myalgias.    Allergies  Albuterol; Atenolol; Benzoyl peroxide; Codeine; Cyclobenzaprine hcl; Diphenhydramine hcl; Erythromycin; Fentanyl; Remeron; and Sulfonamide derivatives  Home Medications   Prior to Admission medications   Medication Sig Start Date End Date Taking? Authorizing Provider  benzonatate (TESSALON) 200 MG capsule Take 1 capsule (200 mg total) by mouth 2 (two) times daily as needed for cough. 10/05/14   Emeterio Reeve, DO  fluconazole (DIFLUCAN) 150  MG tablet Take 1 tablet (150 mg total) by mouth once. May take second dose in 3 days if still symptomatic 01/01/15   Noland Fordyce, PA-C  lansoprazole (PREVACID) 30 MG capsule TAKE ONE CAPSULE BY MOUTH DAILY 11/22/14   Hali Marry, MD  Linaclotide [facility]) 290 MCG CAPS capsule Take 1 capsule (290 mcg total) by mouth daily. 03/16/14   Amy S Esterwood, PA-C  LORazepam (ATIVAN) 1 MG tablet Take 1 mg by mouth at bedtime.    Historical Provider, MD  methylphenidate (RITALIN) 10 MG tablet Take 30 mg by mouth 2 (two) times daily.    Historical Provider, MD  metoprolol succinate (TOPROL-XL) 25 MG 24 hr  tablet Take 25 mg by mouth daily.    Historical Provider, MD  midodrine (PROAMATINE) 2.5 MG tablet Take 2.5 mg by mouth daily.    Historical Provider, MD  nitrofurantoin, macrocrystal-monohydrate, (MACROBID) 100 MG capsule Take 1 capsule (100 mg total) by mouth 2 (two) times daily. 01/01/15   Noland Fordyce, PA-C  oxymorphone (OPANA ER) 20 MG 12 hr tablet Take 20 mg by mouth every 12 (twelve) hours.    Historical Provider, MD   Meds Ordered and Administered this Visit  Medications - No data to display  BP 153/101 mmHg  Pulse 101  Temp(Src) 97.8 F (36.6 C) (Oral)  Ht 5\' 4"  (1.626 m)  Wt 127 lb (57.607 kg)  BMI 21.79 kg/m2  SpO2 100% No data found.   Physical Exam  Constitutional: She is oriented to person, place, and time. She appears well-developed and well-nourished.  HENT:  Head: Normocephalic and atraumatic.  Eyes: EOM are normal.  Neck: Normal range of motion.  Cardiovascular: Regular rhythm and normal heart sounds.  Tachycardia present.   Mild  Pulmonary/Chest: Effort normal and breath sounds normal. No respiratory distress. She has no wheezes. She has no rales. She exhibits no tenderness.  Abdominal: Soft. She exhibits no distension. There is no tenderness. There is no rebound and no CVA tenderness.  Musculoskeletal: Normal range of motion.  Neurological: She is alert and oriented to person, place, and time.  Skin: Skin is warm and dry.  Psychiatric: Her behavior is normal. Her mood appears anxious ( jittery).  Nursing note and vitals reviewed.   ED Course  Procedures (including critical care time)  Labs Review Labs Reviewed  URINE CULTURE  POCT URINALYSIS DIP (MANUAL ENTRY)    Imaging Review No results found.    MDM   1. Dysuria   2. Urinary frequency   3. Urinary urgency     Pt with hx of recurrent UTIs presenting to [facility] with c/o symptoms c/w prior UTIs.   UA is normal, however, pt notes she has been taking Uristat and Cystex which could alter test  resulted. Urine culture sent Rx: macrobid and diflucan (hx of yeast infections after taking antibiotics) F/u with PCP in 5-7 days if not improving, sooner if worsening and for recurrent UTIs as it has been over 10 years since last f/u with urology.  Patient verbalized understanding and agreement with treatment plan.   Noland Fordyce, PA-C 01/01/15 [phone]

## 2015-01-04 ENCOUNTER — Telehealth: Payer: Self-pay | Admitting: *Deleted

## 2015-01-04 LAB — URINE CULTURE: Colony Count: 15000

## 2015-01-17 DIAGNOSIS — G894 Chronic pain syndrome: Secondary | ICD-10-CM | POA: Diagnosis not present

## 2015-01-17 DIAGNOSIS — M5442 Lumbago with sciatica, left side: Secondary | ICD-10-CM | POA: Diagnosis not present

## 2015-01-17 DIAGNOSIS — M5417 Radiculopathy, lumbosacral region: Secondary | ICD-10-CM | POA: Diagnosis not present

## 2015-01-17 DIAGNOSIS — M5441 Lumbago with sciatica, right side: Secondary | ICD-10-CM | POA: Diagnosis not present

## 2015-01-18 ENCOUNTER — Ambulatory Visit (INDEPENDENT_AMBULATORY_CARE_PROVIDER_SITE_OTHER): Payer: Medicare Other | Admitting: Family Medicine

## 2015-01-18 ENCOUNTER — Encounter: Payer: Self-pay | Admitting: Family Medicine

## 2015-01-18 VITALS — BP 132/86 | HR 98 | Temp 97.7°F | Wt 129.0 lb

## 2015-01-18 DIAGNOSIS — R300 Dysuria: Secondary | ICD-10-CM | POA: Diagnosis not present

## 2015-01-18 DIAGNOSIS — N3000 Acute cystitis without hematuria: Secondary | ICD-10-CM | POA: Diagnosis not present

## 2015-01-18 LAB — POCT URINALYSIS DIPSTICK
Bilirubin, UA: NEGATIVE
Glucose, UA: NEGATIVE
KETONES UA: NEGATIVE
Nitrite, UA: NEGATIVE
PH UA: 6
PROTEIN UA: NEGATIVE
Spec Grav, UA: >=1.03
Urobilinogen, UA: 0.2

## 2015-01-18 MED ORDER — CIPROFLOXACIN HCL 500 MG PO TABS: 500.0000 mg | ORAL_TABLET | Freq: Two times a day (BID) | ORAL | Status: AC

## 2015-01-18 NOTE — Patient Instructions (Signed)

## 2015-01-18 NOTE — Progress Notes (Signed)
   Subjective:    Patient ID: Mary [name], female    DOB: 02/15/1965, 50 y.o.   MRN: [id number]  HPI Was seen 2.5 week ago for UTI at [facility].  Says was started on macrobid adn diflucan and says started to feel bette.  Then on Sat night about 4 days ago started to get sudden sxs as well.  Pelvic pain and burning with urination. She takes Cystex. She has a hx of urethral stricture. Had fever initallly but nothing in the last 2 weeks.  She has chronic low back pain and this is not new. Review of Systems     Objective:   Physical Exam  Constitutional: She is oriented to person, place, and time. She appears well-developed and well-nourished.  HENT:  Head: Normocephalic and atraumatic.  Eyes: Conjunctivae and EOM are normal.  Cardiovascular: Normal rate.   Pulmonary/Chest: Effort normal.  Neurological: She is alert and oriented to person, place, and time.  Skin: Skin is dry. No pallor.  Psychiatric: She has a normal mood and affect. Her behavior is normal.  Vitals reviewed.         Assessment & Plan:  UTI - her symptoms are most consistent with a UTI. Repeat culture sent today. Previous culture only showed coagulase negative staph. Her that that is a skin contaminant and didn't really help [place] understand whether or not the hepatic she was taking could have been resistant or not. We'll go ahead and treat her with Cipro for 5 days today. She is hydrating well. Call if not better in 3-5 days.

## 2015-01-20 LAB — URINE CULTURE
COLONY COUNT: NO GROWTH
Organism ID, Bacteria: NO GROWTH

## 2015-02-17 DIAGNOSIS — I471 Supraventricular tachycardia: Secondary | ICD-10-CM | POA: Diagnosis not present

## 2015-02-17 DIAGNOSIS — R000 Tachycardia, unspecified: Secondary | ICD-10-CM | POA: Diagnosis not present

## 2015-02-17 DIAGNOSIS — R55 Syncope and collapse: Secondary | ICD-10-CM | POA: Diagnosis not present

## 2015-02-21 ENCOUNTER — Other Ambulatory Visit: Payer: Self-pay | Admitting: Family Medicine

## 2015-02-21 DIAGNOSIS — M25551 Pain in right hip: Secondary | ICD-10-CM | POA: Diagnosis not present

## 2015-02-21 DIAGNOSIS — G894 Chronic pain syndrome: Secondary | ICD-10-CM | POA: Diagnosis not present

## 2015-02-21 DIAGNOSIS — M5417 Radiculopathy, lumbosacral region: Secondary | ICD-10-CM | POA: Diagnosis not present

## 2015-02-21 DIAGNOSIS — M5442 Lumbago with sciatica, left side: Secondary | ICD-10-CM | POA: Diagnosis not present

## 2015-03-03 DIAGNOSIS — F4310 Post-traumatic stress disorder, unspecified: Secondary | ICD-10-CM | POA: Diagnosis not present

## 2015-03-03 DIAGNOSIS — F331 Major depressive disorder, recurrent, moderate: Secondary | ICD-10-CM | POA: Diagnosis not present

## 2015-03-22 DIAGNOSIS — Z79899 Other long term (current) drug therapy: Secondary | ICD-10-CM | POA: Diagnosis not present

## 2015-03-22 DIAGNOSIS — Z5181 Encounter for therapeutic drug level monitoring: Secondary | ICD-10-CM | POA: Diagnosis not present

## 2015-03-22 DIAGNOSIS — G894 Chronic pain syndrome: Secondary | ICD-10-CM | POA: Diagnosis not present

## 2015-03-22 DIAGNOSIS — M25551 Pain in right hip: Secondary | ICD-10-CM | POA: Diagnosis not present

## 2015-03-22 DIAGNOSIS — M5417 Radiculopathy, lumbosacral region: Secondary | ICD-10-CM | POA: Diagnosis not present

## 2015-03-22 DIAGNOSIS — M5442 Lumbago with sciatica, left side: Secondary | ICD-10-CM | POA: Diagnosis not present

## 2015-04-25 DIAGNOSIS — M25551 Pain in right hip: Secondary | ICD-10-CM | POA: Diagnosis not present

## 2015-04-25 DIAGNOSIS — M5417 Radiculopathy, lumbosacral region: Secondary | ICD-10-CM | POA: Diagnosis not present

## 2015-04-25 DIAGNOSIS — M5442 Lumbago with sciatica, left side: Secondary | ICD-10-CM | POA: Diagnosis not present

## 2015-04-25 DIAGNOSIS — G894 Chronic pain syndrome: Secondary | ICD-10-CM | POA: Diagnosis not present

## 2015-05-24 DIAGNOSIS — G894 Chronic pain syndrome: Secondary | ICD-10-CM | POA: Diagnosis not present

## 2015-05-24 DIAGNOSIS — M5442 Lumbago with sciatica, left side: Secondary | ICD-10-CM | POA: Diagnosis not present

## 2015-05-24 DIAGNOSIS — M5417 Radiculopathy, lumbosacral region: Secondary | ICD-10-CM | POA: Diagnosis not present

## 2015-05-24 DIAGNOSIS — M25551 Pain in right hip: Secondary | ICD-10-CM | POA: Diagnosis not present

## 2015-05-25 ENCOUNTER — Other Ambulatory Visit: Payer: Self-pay | Admitting: Family Medicine

## 2015-05-25 ENCOUNTER — Encounter: Payer: Self-pay | Admitting: Physician Assistant

## 2015-05-25 ENCOUNTER — Ambulatory Visit (INDEPENDENT_AMBULATORY_CARE_PROVIDER_SITE_OTHER): Payer: Medicare Other | Admitting: Physician Assistant

## 2015-05-25 VITALS — BP 125/73 | HR 96 | Temp 98.5°F | Ht 64.0 in | Wt 127.0 lb

## 2015-05-25 DIAGNOSIS — B9789 Other viral agents as the cause of diseases classified elsewhere: Principal | ICD-10-CM

## 2015-05-25 DIAGNOSIS — J069 Acute upper respiratory infection, unspecified: Secondary | ICD-10-CM | POA: Diagnosis not present

## 2015-05-25 MED ORDER — HYDROCOD POLST-CPM POLST ER 10-8 MG/5ML PO SUER: 5.0000 mL | Freq: Two times a day (BID) | ORAL | Status: DC | PRN

## 2015-05-25 MED ORDER — GUAIFENESIN ER 600 MG PO TB12: 600.0000 mg | ORAL_TABLET | Freq: Two times a day (BID) | ORAL | Status: DC

## 2015-05-25 MED ORDER — BENZONATATE 200 MG PO CAPS: 200.0000 mg | ORAL_CAPSULE | Freq: Two times a day (BID) | ORAL | Status: DC | PRN

## 2015-05-25 NOTE — Progress Notes (Signed)
   Subjective:    Patient ID: Mary [name], female    DOB: [birth year], 50 y.o.   MRN: [id number]  HPI  Pt is a 50 yo female who presents to the clinic with ST, cough, weakness and fatigue for 1 days. Symptoms started yesterday. Cough is sometimes productive but mostly clear. Reports fever of 102. Taking tylenol. Having bilateral ear pain. She is hot and cold back and forth. She is taking a cough suppressant with little relief. No SOB or wheezing. Pt is concerned because she has back pain and the coughing makes the back pain worse. She has gotten pneumonia before because not coughing.   Review of Systems See HPI.     Objective:   Physical Exam  Constitutional: She is oriented to person, place, and time. She appears well-developed and well-nourished.  HENT:  Head: Normocephalic and atraumatic.  Right Ear: External ear normal.  Left Ear: External ear normal.  Nose: Nose normal.  Mouth/Throat: Oropharynx is clear and moist. No oropharyngeal exudate.  TM's clear.  Negative for maxillary or frontal sinus tenderness.    Eyes: Conjunctivae are normal. Right eye exhibits no discharge. Left eye exhibits no discharge.  Neck: Normal range of motion. Neck supple.  Cardiovascular: Normal rate, regular rhythm and normal heart sounds.   Pulmonary/Chest: Effort normal and breath sounds normal. She has no wheezes.  Lymphadenopathy:    She has no cervical adenopathy.  Neurological: She is alert and oriented to person, place, and time.  Psychiatric: She has a normal mood and affect. Her behavior is normal.          Assessment & Plan:  Viral URI with cough- start mucinex and flonase. Tessalon during the day and tussionex at bedtime. Stay hydrated. If not improving in 2-3 days may consider abx.

## 2015-05-25 NOTE — Patient Instructions (Signed)
mucinex 600mg  twice a day.  Tessalon pearls as needed during the day for cough.  tussinonex at bedtime for cough.  Ibuprofen/tylenol for fever, body aches and chills.  Rest and hydrate.   Upper Respiratory Infection, Adult Most upper respiratory infections (URIs) are a viral infection of the air passages leading to the lungs. A URI affects the nose, throat, and upper air passages. The most common type of URI is nasopharyngitis and is typically referred to as "the common cold." URIs run their course and usually go away on their own. Most of the time, a URI does not require medical attention, but sometimes a bacterial infection in the upper airways can follow a viral infection. This is called a secondary infection. Sinus and middle ear infections are common types of secondary upper respiratory infections. Bacterial pneumonia can also complicate a URI. A URI can worsen asthma and chronic obstructive pulmonary disease (COPD). Sometimes, these complications can require emergency medical care and may be life threatening.  CAUSES Almost all URIs are caused by viruses. A virus is a type of germ and can spread from one person to another.  RISKS FACTORS You may be at risk for a URI if:   You smoke.   You have chronic heart or lung disease.  You have a weakened defense (immune) system.   You are very young or very old.   You have nasal allergies or asthma.  You work in crowded or poorly ventilated areas.  You work in health care facilities or schools. SIGNS AND SYMPTOMS  Symptoms typically develop 2-3 days after you come in contact with a cold virus. Most viral URIs last 7-10 days. However, viral URIs from the influenza virus (flu virus) can last 14-18 days and are typically more severe. Symptoms may include:   Runny or stuffy (congested) nose.   Sneezing.   Cough.   Sore throat.   Headache.   Fatigue.   Fever.   Loss of appetite.   Pain in your forehead, behind your  eyes, and over your cheekbones (sinus pain).  Muscle aches.  DIAGNOSIS  Your health care provider may diagnose a URI by:  Physical exam.  Tests to check that your symptoms are not due to another condition such as:  Strep throat.  Sinusitis.  Pneumonia.  Asthma. TREATMENT  A URI goes away on its own with time. It cannot be cured with medicines, but medicines may be prescribed or recommended to relieve symptoms. Medicines may help:  Reduce your fever.  Reduce your cough.  Relieve nasal congestion. HOME CARE INSTRUCTIONS   Take medicines only as directed by your health care provider.   Gargle warm saltwater or take cough drops to comfort your throat as directed by your health care provider.  Use a warm mist humidifier or inhale steam from a shower to increase air moisture. This may make it easier to breathe.  Drink enough fluid to keep your urine clear or pale yellow.   Eat soups and other clear broths and maintain good nutrition.   Rest as needed.   Return to work when your temperature has returned to normal or as your health care provider advises. You may need to stay home longer to avoid infecting others. You can also use a face mask and careful hand washing to prevent spread of the virus.  Increase the usage of your inhaler if you have asthma.   Do not use any tobacco products, including cigarettes, chewing tobacco, or electronic cigarettes. If you need  help quitting, ask your health care provider. PREVENTION  The best way to protect yourself from getting a cold is to practice good hygiene.   Avoid oral or hand contact with people with cold symptoms.   Wash your hands often if contact occurs.  There is no clear evidence that vitamin C, vitamin E, echinacea, or exercise reduces the chance of developing a cold. However, it is always recommended to get plenty of rest, exercise, and practice good nutrition.  SEEK MEDICAL CARE IF:   You are getting worse  rather than better.   Your symptoms are not controlled by medicine.   You have chills.  You have worsening shortness of breath.  You have brown or red mucus.  You have yellow or brown nasal discharge.  You have pain in your face, especially when you bend forward.  You have a fever.  You have swollen neck glands.  You have pain while swallowing.  You have white areas in the back of your throat. SEEK IMMEDIATE MEDICAL CARE IF:   You have severe or persistent:  Headache.  Ear pain.  Sinus pain.  Chest pain.  You have chronic lung disease and any of the following:  Wheezing.  Prolonged cough.  Coughing up blood.  A change in your usual mucus.  You have a stiff neck.  You have changes in your:  Vision.  Hearing.  Thinking.  Mood. MAKE SURE YOU:   Understand these instructions.  Will watch your condition.  Will get help right away if you are not doing well or get worse.   This information is not intended to replace advice given to you by your health care provider. Make sure you discuss any questions you have with your health care provider.   Document Released: 06/13/2000 Document Revised: 05/04/2014 Document Reviewed: 03/25/2013 Elsevier Interactive Patient Education [employer].

## 2015-06-02 DIAGNOSIS — F331 Major depressive disorder, recurrent, moderate: Secondary | ICD-10-CM | POA: Diagnosis not present

## 2015-06-21 DIAGNOSIS — G894 Chronic pain syndrome: Secondary | ICD-10-CM | POA: Diagnosis not present

## 2015-06-21 DIAGNOSIS — M5442 Lumbago with sciatica, left side: Secondary | ICD-10-CM | POA: Diagnosis not present

## 2015-06-21 DIAGNOSIS — Z5181 Encounter for therapeutic drug level monitoring: Secondary | ICD-10-CM | POA: Diagnosis not present

## 2015-06-21 DIAGNOSIS — M25551 Pain in right hip: Secondary | ICD-10-CM | POA: Diagnosis not present

## 2015-06-21 DIAGNOSIS — Z79899 Other long term (current) drug therapy: Secondary | ICD-10-CM | POA: Diagnosis not present

## 2015-06-21 DIAGNOSIS — M5417 Radiculopathy, lumbosacral region: Secondary | ICD-10-CM | POA: Diagnosis not present

## 2015-07-06 ENCOUNTER — Other Ambulatory Visit: Payer: Self-pay | Admitting: Family Medicine

## 2015-07-19 DIAGNOSIS — M5417 Radiculopathy, lumbosacral region: Secondary | ICD-10-CM | POA: Diagnosis not present

## 2015-07-19 DIAGNOSIS — M5442 Lumbago with sciatica, left side: Secondary | ICD-10-CM | POA: Diagnosis not present

## 2015-07-19 DIAGNOSIS — G894 Chronic pain syndrome: Secondary | ICD-10-CM | POA: Diagnosis not present

## 2015-07-19 DIAGNOSIS — M25551 Pain in right hip: Secondary | ICD-10-CM | POA: Diagnosis not present

## 2015-08-11 DIAGNOSIS — G894 Chronic pain syndrome: Secondary | ICD-10-CM | POA: Diagnosis not present

## 2015-08-11 DIAGNOSIS — M5417 Radiculopathy, lumbosacral region: Secondary | ICD-10-CM | POA: Diagnosis not present

## 2015-08-11 DIAGNOSIS — M5442 Lumbago with sciatica, left side: Secondary | ICD-10-CM | POA: Diagnosis not present

## 2015-08-11 DIAGNOSIS — M5441 Lumbago with sciatica, right side: Secondary | ICD-10-CM | POA: Diagnosis not present

## 2015-08-18 DIAGNOSIS — R000 Tachycardia, unspecified: Secondary | ICD-10-CM | POA: Diagnosis not present

## 2015-08-18 DIAGNOSIS — Z9889 Other specified postprocedural states: Secondary | ICD-10-CM | POA: Diagnosis not present

## 2015-08-18 DIAGNOSIS — I471 Supraventricular tachycardia: Secondary | ICD-10-CM | POA: Diagnosis not present

## 2015-08-18 DIAGNOSIS — I517 Cardiomegaly: Secondary | ICD-10-CM | POA: Diagnosis not present

## 2015-08-31 DIAGNOSIS — F4310 Post-traumatic stress disorder, unspecified: Secondary | ICD-10-CM | POA: Diagnosis not present

## 2015-08-31 DIAGNOSIS — F331 Major depressive disorder, recurrent, moderate: Secondary | ICD-10-CM | POA: Diagnosis not present

## 2015-09-21 DIAGNOSIS — G894 Chronic pain syndrome: Secondary | ICD-10-CM | POA: Diagnosis not present

## 2015-09-21 DIAGNOSIS — M5442 Lumbago with sciatica, left side: Secondary | ICD-10-CM | POA: Diagnosis not present

## 2015-09-21 DIAGNOSIS — M5417 Radiculopathy, lumbosacral region: Secondary | ICD-10-CM | POA: Diagnosis not present

## 2015-09-21 DIAGNOSIS — M25551 Pain in right hip: Secondary | ICD-10-CM | POA: Diagnosis not present

## 2015-10-18 DIAGNOSIS — Z5181 Encounter for therapeutic drug level monitoring: Secondary | ICD-10-CM | POA: Diagnosis not present

## 2015-10-18 DIAGNOSIS — G894 Chronic pain syndrome: Secondary | ICD-10-CM | POA: Diagnosis not present

## 2015-10-18 DIAGNOSIS — M5442 Lumbago with sciatica, left side: Secondary | ICD-10-CM | POA: Diagnosis not present

## 2015-10-18 DIAGNOSIS — Z79899 Other long term (current) drug therapy: Secondary | ICD-10-CM | POA: Diagnosis not present

## 2015-10-18 DIAGNOSIS — M5417 Radiculopathy, lumbosacral region: Secondary | ICD-10-CM | POA: Diagnosis not present

## 2015-10-18 DIAGNOSIS — M25551 Pain in right hip: Secondary | ICD-10-CM | POA: Diagnosis not present

## 2015-11-15 DIAGNOSIS — G894 Chronic pain syndrome: Secondary | ICD-10-CM | POA: Diagnosis not present

## 2015-11-15 DIAGNOSIS — M25551 Pain in right hip: Secondary | ICD-10-CM | POA: Diagnosis not present

## 2015-11-15 DIAGNOSIS — M5442 Lumbago with sciatica, left side: Secondary | ICD-10-CM | POA: Diagnosis not present

## 2015-11-15 DIAGNOSIS — M5417 Radiculopathy, lumbosacral region: Secondary | ICD-10-CM | POA: Diagnosis not present

## 2015-11-22 DIAGNOSIS — F331 Major depressive disorder, recurrent, moderate: Secondary | ICD-10-CM | POA: Diagnosis not present

## 2015-11-22 DIAGNOSIS — F4310 Post-traumatic stress disorder, unspecified: Secondary | ICD-10-CM | POA: Diagnosis not present

## 2015-12-13 ENCOUNTER — Other Ambulatory Visit: Payer: Self-pay | Admitting: Family Medicine

## 2015-12-15 DIAGNOSIS — M5417 Radiculopathy, lumbosacral region: Secondary | ICD-10-CM | POA: Diagnosis not present

## 2015-12-15 DIAGNOSIS — G894 Chronic pain syndrome: Secondary | ICD-10-CM | POA: Diagnosis not present

## 2015-12-15 DIAGNOSIS — M25551 Pain in right hip: Secondary | ICD-10-CM | POA: Diagnosis not present

## 2015-12-15 DIAGNOSIS — M5442 Lumbago with sciatica, left side: Secondary | ICD-10-CM | POA: Diagnosis not present

## 2016-01-09 ENCOUNTER — Other Ambulatory Visit: Payer: Self-pay | Admitting: Family Medicine

## 2016-01-15 ENCOUNTER — Emergency Department (INDEPENDENT_AMBULATORY_CARE_PROVIDER_SITE_OTHER)
Admission: EM | Admit: 2016-01-15 | Discharge: 2016-01-15 | Disposition: A | Discharge status: Home / Self Care | Payer: Medicare Other | Source: Home / Self Care | Attending: Family Medicine | Admitting: Family Medicine

## 2016-01-15 ENCOUNTER — Emergency Department (INDEPENDENT_AMBULATORY_CARE_PROVIDER_SITE_OTHER): Payer: Medicare Other

## 2016-01-15 ENCOUNTER — Encounter: Payer: Self-pay | Admitting: Emergency Medicine

## 2016-01-15 DIAGNOSIS — R2232 Localized swelling, mass and lump, left upper limb: Secondary | ICD-10-CM | POA: Diagnosis not present

## 2016-01-15 DIAGNOSIS — M7989 Other specified soft tissue disorders: Secondary | ICD-10-CM | POA: Diagnosis not present

## 2016-01-15 DIAGNOSIS — W5501XA Bitten by cat, initial encounter: Secondary | ICD-10-CM | POA: Diagnosis not present

## 2016-01-15 DIAGNOSIS — S61452A Open bite of left hand, initial encounter: Secondary | ICD-10-CM

## 2016-01-15 MED ORDER — CEFTRIAXONE SODIUM 1 G IJ SOLR
1.0000 g | Freq: Once | INTRAMUSCULAR | Status: AC
  Administered 2016-01-15: 1 g via INTRAMUSCULAR

## 2016-01-15 MED ORDER — DOXYCYCLINE HYCLATE 100 MG PO CAPS: 100.0000 mg | ORAL_CAPSULE | Freq: Two times a day (BID) | ORAL | 0 refills | Status: DC

## 2016-01-15 NOTE — ED Triage Notes (Signed)
Pt was playing with her cat and the cat accidentally bit pt.  Left thumb swelling, redness and pain.  Td utd.

## 2016-01-15 NOTE — ED Provider Notes (Signed)
CSN: [id number]     Arrival date & time 01/15/16  1314 History   First MD Initiated Contact with Patient 01/15/16 1344     Chief Complaint  Patient presents with  . Animal Bite   (Consider location/radiation/quality/duration/timing/severity/associated sxs/prior Treatment) HPI Mary [name] is a 51 y.o. female presenting to UC with c/o suddenly worsening redness, pain, and swelling in Left hand after her cat bit her while playing yesterday morning.  Pain is aching and sore, 5/10 at this time. Worse with palpation and movement. She is Right hand dominant. No fever, chills, n/v/d.  Cat has had rabies vaccine. Td: 05/06/2008  Past Medical History:  Diagnosis Date  . Anxiety   . Complication of anesthesia    "hard to put to sleep", woke up during procedure in the past  . Diverticulosis of colon (without mention of hemorrhage)   . Herniated disc   . Mental disorder    PTSD  . MVP (mitral valve prolapse)   . PAF (paroxysmal atrial fibrillation) ([place])   . Personal history of colonic polyps 06/26/2007   tubular adenoma  . Shortness of breath    Due to Dilaudid and Morphine  . SVT (supraventricular tachycardia) (HCC)    hx of ablation   Past Surgical History:  Procedure Laterality Date  . BACK SURGERY     several  . CARDIAC ELECTROPHYSIOLOGY MAPPING AND ABLATION    . CARDIAC ELECTROPHYSIOLOGY MAPPING AND ABLATION  12/2011  . COLONOSCOPY    . ENDOMETRIAL ABLATION  04/19/11  . TONSILLECTOMY    . URETHRA SURGERY     Family History  Problem Relation Age of Onset  . Coronary artery disease Father   . Hypertension Father   . Colon cancer Father   . Neuropathy Mother   . Stomach cancer Paternal Aunt    Social History  Substance Use Topics  . Smoking status: Current Every Day Smoker    Packs/day: 0.80    Years: 28.00    Types: Cigarettes  . Smokeless tobacco: Never Used     Comment: started smoking at age 62. Counseling sheet given in exam room to quit smoking 02-13-2011  .  Alcohol use Yes     Comment: rarely   OB History    Gravida Para Term Preterm AB Living   1       1     SAB TAB Ectopic Multiple Live Births     1           Review of Systems  Constitutional: Negative for chills and fever.  Musculoskeletal: Positive for arthralgias, joint swelling and myalgias.  Skin: Positive for color change and wound.       Left hand  Neurological: Negative for weakness and numbness.    Allergies  Albuterol; Atenolol; Benzoyl peroxide; Codeine; Cyclobenzaprine hcl; Diphenhydramine hcl; Erythromycin; Fentanyl; Remeron [mirtazapine]; and Sulfonamide derivatives  Home Medications   Prior to Admission medications   Medication Sig Start Date End Date Taking? Authorizing Provider  Buprenorphine HCl (BELBUCA) 600 MCG FILM Place inside cheek.   Yes Historical Provider, MD  doxycycline (VIBRAMYCIN) 100 MG capsule Take 1 capsule (100 mg total) by mouth 2 (two) times daily. One po bid x 7 days 01/15/16   Noland Fordyce, PA-C  methylphenidate (RITALIN) 10 MG tablet Take 30 mg by mouth 2 (two) times daily.    Historical Provider, MD  metoprolol succinate (TOPROL-XL) 25 MG 24 hr tablet Take 25 mg by mouth daily.    Historical Provider, MD  midodrine (PROAMATINE) 2.5 MG tablet Take 2.5 mg by mouth daily.    Historical Provider, MD  oxymorphone (OPANA ER) 20 MG 12 hr tablet Take 20 mg by mouth every 12 (twelve) hours.    Historical Provider, MD   Meds Ordered and Administered this Visit   Medications  cefTRIAXone (ROCEPHIN) injection 1 g (1 g Intramuscular Given 01/15/16 1408)    BP 139/97 (BP Location: Left Arm)   Pulse 97   Temp 97.7 F (36.5 C) (Oral)   Ht 5\' 5"  (1.651 m)   Wt 130 lb 8 oz (59.2 kg)   SpO2 100%   BMI 21.72 kg/m  No data found.   Physical Exam  Constitutional: She is oriented to person, place, and time. She appears well-developed and well-nourished.  HENT:  Head: Normocephalic and atraumatic.  Eyes: EOM are normal.  Neck: Normal range of  motion.  Cardiovascular: Normal rate.   Pulses:      Radial pulses are 2+ on the left side.  Pulmonary/Chest: Effort normal.  Musculoskeletal: She exhibits edema and tenderness.       Hands: Left hand: mild edema over 1st and 2nd metacarpals. Tender. Slight decreased ROM with thumb on extension. Able to make a full fist. 4/5 grip strength compared to Left. Muscle compartments in hand and forearm are soft.   Neurological: She is alert and oriented to person, place, and time.  Skin: Skin is warm and dry. Capillary refill takes less than 2 seconds. There is erythema.  Left hand: one puncture wound to dorsal aspect and one puncture wound over thenar muscle.  Erythema, warmth. Tenderness over dorsum of hand. No induration or fluctuance. No bleeding or drainage.   Psychiatric: She has a normal mood and affect. Her behavior is normal.  Nursing note and vitals reviewed.   Urgent Care Course   Clinical Course     Procedures (including critical care time)  Labs Review Labs Reviewed - No data to display  Imaging Review Dg Hand Complete Left  Result Date: 01/15/2016 CLINICAL DATA:  Initial encounter for Pt states she was bitten by her cat yesterday on her left hand. Puncture wounds to proximal anterior and posterior portions of left thumb with redness and swelling to entire hand. EXAM: LEFT HAND - COMPLETE 3+ VIEW COMPARISON:  None. FINDINGS: Soft tissue swelling about the radial aspect of the hand. No soft tissue gas. No acute fracture or dislocation. No radiopaque foreign object. Tiny osseous density adjacent the ulnar styloid could relate to remote trauma or soft tissue calcification. IMPRESSION: Soft tissue swelling, without acute osseous abnormality. Electronically Signed   By: Abigail Miyamoto M.D.   On: 01/15/2016 14:43    Left hand: hand soaked in warm water with hibiclens. Puncture wounds also flushed with 40cc saline with syringe.    MDM   1. Cat bite of left hand, initial encounter      Pt presenting to UC with infected cat bite. Wound thoroughly cleaned. Plain films: no fractures, foreign bodies, or soft tissue gas.  Tx: Rocephin 1g IM given  Consulted with Dr. [name], Sports Medicine. Recommends starting pt on Doxycycline.  Encouraged f/u with PCP tomorrow or return to UC to ensure no worsening of symptoms given nature of injury and speed infection came on.  Patient verbalized understanding and agreement with treatment plan.     Noland Fordyce, PA-C 01/15/16 1542

## 2016-01-16 ENCOUNTER — Emergency Department (INDEPENDENT_AMBULATORY_CARE_PROVIDER_SITE_OTHER)
Admission: EM | Admit: 2016-01-16 | Discharge: 2016-01-16 | Disposition: A | Discharge status: Home / Self Care | Payer: Medicare Other | Source: Home / Self Care | Attending: Family Medicine | Admitting: Family Medicine

## 2016-01-16 DIAGNOSIS — W5501XD Bitten by cat, subsequent encounter: Secondary | ICD-10-CM

## 2016-01-16 DIAGNOSIS — L03114 Cellulitis of left upper limb: Secondary | ICD-10-CM | POA: Diagnosis not present

## 2016-01-16 DIAGNOSIS — Z79899 Other long term (current) drug therapy: Secondary | ICD-10-CM | POA: Diagnosis not present

## 2016-01-16 DIAGNOSIS — M25551 Pain in right hip: Secondary | ICD-10-CM | POA: Diagnosis not present

## 2016-01-16 DIAGNOSIS — M5417 Radiculopathy, lumbosacral region: Secondary | ICD-10-CM | POA: Diagnosis not present

## 2016-01-16 DIAGNOSIS — M5442 Lumbago with sciatica, left side: Secondary | ICD-10-CM | POA: Diagnosis not present

## 2016-01-16 DIAGNOSIS — G894 Chronic pain syndrome: Secondary | ICD-10-CM | POA: Diagnosis not present

## 2016-01-16 DIAGNOSIS — Z5181 Encounter for therapeutic drug level monitoring: Secondary | ICD-10-CM | POA: Diagnosis not present

## 2016-01-16 MED ORDER — METRONIDAZOLE 500 MG PO TABS: 500.0000 mg | ORAL_TABLET | Freq: Three times a day (TID) | ORAL | 0 refills | Status: DC

## 2016-01-16 NOTE — ED Provider Notes (Signed)
[facility]    CSN: [id number] Arrival date & time: 01/16/16  1339     History   Chief Complaint Chief Complaint  Patient presents with  . Follow-up  . Animal Bite    HPI Mary [name] is a 51 y.o. female.   Patient was evaluated/treated here yesterday for cat bite cellulitis to her left hand, and received Rocephin injection followed by doxycycline 100mg  BID.  She reports that her left hand and arm became significantly worse last night, and she showed photos demonstrating ascending lymphangitis over the volar surface of her left forearm with increased swelling/redness of her left hand.  Fortunately she was dramatically improved this morning with resolution of the lymphangitis and decrease in swelling/redness of her left hand.  No fevers, chills, and sweats.   The history is provided by the patient and a parent.    Past Medical History:  Diagnosis Date  . Anxiety   . Complication of anesthesia    "hard to put to sleep", woke up during procedure in the past  . Diverticulosis of colon (without mention of hemorrhage)   . Herniated disc   . Mental disorder    PTSD  . MVP (mitral valve prolapse)   . PAF (paroxysmal atrial fibrillation) ([place])   . Personal history of colonic polyps 06/26/2007   tubular adenoma  . Shortness of breath    Due to Dilaudid and Morphine  . SVT (supraventricular tachycardia) ([facility])    hx of ablation    Patient Active Problem List   Diagnosis Date Noted  . Sinus tachycardia 03/30/2013  . PFO (patent foramen ovale) 07/25/2011  . Lynch syndrome 03/06/2011  . DUB (dysfunctional uterine bleeding) 03/06/2011  . Benign neoplasm of colon 02/19/2011  . Metrorrhagia 02/14/2011  . Personal history of colonic polyps 02/13/2011  . Constipation 02/13/2011  . GERD (gastroesophageal reflux disease) 02/13/2011  . Rhinitis due to pollen 04/20/2010  . PULMONARY NODULE 03/13/2010  . NIGHT SWEATS 02/07/2010  . AMENORRHEA, SECONDARY 12/06/2009  .  EDEMA 09/16/2009  . Obstructive chronic bronchitis without exacerbation ([place]) 07/01/2008  . Myalgia and myositis, unspecified 05/25/2008  . MENORRHAGIA, PERIMENOPAUSAL 05/06/2008  . DIVERTICULOSIS OF COLON 02/20/2007  . OVARIAN CYST 02/06/2007  . Chronic constipation 11/19/2005  . DEPRESSION, MAJOR, RECURRENT 10/09/2005  . ANXIETY 10/09/2005  . TOBACCO DEPENDENCE 10/09/2005  . MITRAL VALVE DISORDER 10/09/2005  . ATRIAL FIBRILLATION 10/09/2005  . BACK PAIN W/RADIATION, UNSPECIFIED 10/09/2005    Past Surgical History:  Procedure Laterality Date  . BACK SURGERY     several  . CARDIAC ELECTROPHYSIOLOGY MAPPING AND ABLATION    . CARDIAC ELECTROPHYSIOLOGY MAPPING AND ABLATION  12/2011  . COLONOSCOPY    . ENDOMETRIAL ABLATION  04/19/11  . TONSILLECTOMY    . URETHRA SURGERY      OB History    Gravida Para Term Preterm AB Living   1       1     SAB TAB Ectopic Multiple Live Births     1             Home Medications    Prior to Admission medications   Medication Sig Start Date End Date Taking? Authorizing Provider  Buprenorphine HCl (BELBUCA) 600 MCG FILM Place inside cheek.    Historical Provider, MD  doxycycline (VIBRAMYCIN) 100 MG capsule Take 1 capsule (100 mg total) by mouth 2 (two) times daily. One po bid x 7 days 01/15/16   Noland Fordyce, PA-C  methylphenidate (RITALIN) 10 MG  tablet Take 30 mg by mouth 2 (two) times daily.    Historical Provider, MD  metoprolol succinate (TOPROL-XL) 25 MG 24 hr tablet Take 25 mg by mouth daily.    Historical Provider, MD  metroNIDAZOLE (FLAGYL) 500 MG tablet Take 1 tablet (500 mg total) by mouth 3 (three) times daily. (every 8 hours) 01/16/16   Kandra Nicolas, MD  midodrine (PROAMATINE) 2.5 MG tablet Take 2.5 mg by mouth daily.    Historical Provider, MD  oxymorphone (OPANA ER) 20 MG 12 hr tablet Take 20 mg by mouth every 12 (twelve) hours.    Historical Provider, MD    Family History Family History  Problem Relation Age of Onset  .  Coronary artery disease Father   . Hypertension Father   . Colon cancer Father   . Neuropathy Mother   . Stomach cancer Paternal Aunt     Social History Social History  Substance Use Topics  . Smoking status: Current Every Day Smoker    Packs/day: 0.80    Years: 28.00    Types: Cigarettes  . Smokeless tobacco: Never Used     Comment: started smoking at age 74. Counseling sheet given in exam room to quit smoking 02-13-2011  . Alcohol use Yes     Comment: rarely     Allergies   Albuterol; Atenolol; Benzoyl peroxide; Codeine; Cyclobenzaprine hcl; Diphenhydramine hcl; Erythromycin; Fentanyl; Remeron [mirtazapine]; and Sulfonamide derivatives   Review of Systems Review of Systems  Constitutional: Negative for chills, diaphoresis, fatigue and fever.  Musculoskeletal: Negative for arthralgias, joint swelling, myalgias and neck pain.  Skin: Positive for color change.  All other systems reviewed and are negative.    Physical Exam Triage Vital Signs ED Triage Vitals [01/16/16 1351]  Enc Vitals Group     BP 134/87     Pulse Rate 92     Resp      Temp 97.6 F (36.4 C)     Temp Source Oral     SpO2 96 %     Weight      Height      Head Circumference      Peak Flow      Pain Score 6     Pain Loc      Pain Edu?      Excl. in [place]?    No data found.   Updated Vital Signs BP 134/87 (BP Location: Left Arm)   Pulse 92   Temp 97.6 F (36.4 C) (Oral)   SpO2 96%   Visual Acuity Right Eye Distance:   Left Eye Distance:   Bilateral Distance:    Right Eye Near:   Left Eye Near:    Bilateral Near:     Physical Exam  Constitutional: She appears well-developed and well-nourished. No distress.  Eyes: Pupils are equal, round, and reactive to light.  Cardiovascular: Normal rate.   Pulmonary/Chest: Effort normal.  Musculoskeletal:       Left hand: She exhibits decreased range of motion, tenderness and swelling. She exhibits no bony tenderness and normal capillary refill.  Normal sensation noted.       Hands: Left hand has minimal swelling/tenderness, and mild decreased range of motion of MCP joints.  There is patch of light erythema over first metacarpal with central 75mm pustule as noted on diagram.  No induration or fluctuance.  No ascending lymphangitis present.  Neurological: She is alert.  Skin: Skin is warm and dry.  Nursing note and vitals reviewed.  UC Treatments / Results  Labs (all labs ordered are listed, but only abnormal results are displayed) Labs Reviewed - No data to display  EKG  EKG Interpretation None       Radiology Dg Hand Complete Left  Result Date: 01/15/2016 CLINICAL DATA:  Initial encounter for Pt states she was bitten by her cat yesterday on her left hand. Puncture wounds to proximal anterior and posterior portions of left thumb with redness and swelling to entire hand. EXAM: LEFT HAND - COMPLETE 3+ VIEW COMPARISON:  None. FINDINGS: Soft tissue swelling about the radial aspect of the hand. No soft tissue gas. No acute fracture or dislocation. No radiopaque foreign object. Tiny osseous density adjacent the ulnar styloid could relate to remote trauma or soft tissue calcification. IMPRESSION: Soft tissue swelling, without acute osseous abnormality. Electronically Signed   By: Abigail Miyamoto M.D.   On: 01/15/2016 14:43    Procedures Procedures (including critical care time)  Medications Ordered in UC Medications - No data to display   Initial Impression / Assessment and Plan / UC Course  I have reviewed the triage vital signs and the nursing notes.  Pertinent labs & imaging results that were available during my care of the patient were reviewed by me and considered in my medical decision making (see chart for details).  Clinical Course   Cellulitis improving. Continue doxycycline (providing coverage for p. Multocida).  Will add Flagyl 500mg  Q8hr for better anaerobic coverage. Elevate hand when possible.  May take  Ibuprofen 200mg , 4 tabs every 8 hours with food if needed for pain. If symptoms become significantly worse during the night or over the weekend, proceed to the local emergency room.  Followup with Family Doctor if not improved in one week.      Final Clinical Impressions(s) / UC Diagnoses   Final diagnoses:  Cellulitis of left hand  Cat bite, subsequent encounter    New Prescriptions New Prescriptions   METRONIDAZOLE (FLAGYL) 500 MG TABLET    Take 1 tablet (500 mg total) by mouth 3 (three) times daily. (every 8 hours)     Kandra Nicolas, MD 01/16/16 1454

## 2016-01-16 NOTE — Discharge Instructions (Signed)
Elevate hand when possible.  May take Ibuprofen 200mg , 4 tabs every 8 hours with food if needed for pain. If symptoms become significantly worse during the night or over the weekend, proceed to the local emergency room.

## 2016-01-16 NOTE — ED Triage Notes (Signed)
Patient was seen yesterday for cat bite to left hand. Last night site became much worse with growing streaking, redness and swelling. Today site is much improved.

## 2016-01-17 ENCOUNTER — Telehealth: Payer: Self-pay | Admitting: *Deleted

## 2016-01-17 MED ORDER — CLINDAMYCIN HCL 300 MG PO CAPS: 300.0000 mg | ORAL_CAPSULE | Freq: Three times a day (TID) | ORAL | 0 refills | Status: DC

## 2016-01-17 NOTE — Telephone Encounter (Signed)
Pt's mother called reports 1 hour after taking Flagyl she had severe abd pain followed by 12 episodes of vomiting. Spoke to pt informed her per Dr Assunta Found no new ABT for 24 hours,if her hand is better tomorrow complete Doxycline, do not start clindamycin.  If her hand is not better start clindamycin, continue Doxycycline. Called to [facility].

## 2016-01-23 DIAGNOSIS — R531 Weakness: Secondary | ICD-10-CM | POA: Diagnosis not present

## 2016-01-23 DIAGNOSIS — K639 Disease of intestine, unspecified: Secondary | ICD-10-CM | POA: Diagnosis not present

## 2016-01-23 DIAGNOSIS — K56609 Unspecified intestinal obstruction, unspecified as to partial versus complete obstruction: Secondary | ICD-10-CM | POA: Diagnosis not present

## 2016-01-23 DIAGNOSIS — J929 Pleural plaque without asbestos: Secondary | ICD-10-CM | POA: Diagnosis not present

## 2016-01-23 DIAGNOSIS — D1803 Hemangioma of intra-abdominal structures: Secondary | ICD-10-CM | POA: Diagnosis not present

## 2016-01-23 DIAGNOSIS — R1084 Generalized abdominal pain: Secondary | ICD-10-CM | POA: Diagnosis not present

## 2016-01-23 DIAGNOSIS — I471 Supraventricular tachycardia: Secondary | ICD-10-CM | POA: Diagnosis not present

## 2016-01-23 DIAGNOSIS — K5651 Intestinal adhesions [bands], with partial obstruction: Secondary | ICD-10-CM | POA: Diagnosis not present

## 2016-01-23 DIAGNOSIS — K5900 Constipation, unspecified: Secondary | ICD-10-CM | POA: Diagnosis not present

## 2016-01-23 DIAGNOSIS — N179 Acute kidney failure, unspecified: Secondary | ICD-10-CM | POA: Diagnosis not present

## 2016-01-23 DIAGNOSIS — C179 Malignant neoplasm of small intestine, unspecified: Secondary | ICD-10-CM

## 2016-01-23 DIAGNOSIS — R569 Unspecified convulsions: Secondary | ICD-10-CM | POA: Diagnosis not present

## 2016-01-23 DIAGNOSIS — R112 Nausea with vomiting, unspecified: Secondary | ICD-10-CM | POA: Diagnosis not present

## 2016-01-23 DIAGNOSIS — C178 Malignant neoplasm of overlapping sites of small intestine: Secondary | ICD-10-CM | POA: Diagnosis not present

## 2016-01-23 DIAGNOSIS — R1900 Intra-abdominal and pelvic swelling, mass and lump, unspecified site: Secondary | ICD-10-CM | POA: Diagnosis not present

## 2016-01-23 DIAGNOSIS — C772 Secondary and unspecified malignant neoplasm of intra-abdominal lymph nodes: Secondary | ICD-10-CM | POA: Diagnosis not present

## 2016-01-23 DIAGNOSIS — K760 Fatty (change of) liver, not elsewhere classified: Secondary | ICD-10-CM | POA: Diagnosis not present

## 2016-01-23 HISTORY — DX: Malignant neoplasm of small intestine, unspecified: C17.9

## 2016-01-23 HISTORY — PX: COLON SURGERY: SHX602

## 2016-01-24 DIAGNOSIS — J849 Interstitial pulmonary disease, unspecified: Secondary | ICD-10-CM | POA: Diagnosis not present

## 2016-01-24 DIAGNOSIS — C179 Malignant neoplasm of small intestine, unspecified: Secondary | ICD-10-CM | POA: Diagnosis not present

## 2016-01-24 DIAGNOSIS — Z87898 Personal history of other specified conditions: Secondary | ICD-10-CM | POA: Diagnosis not present

## 2016-01-24 DIAGNOSIS — I4891 Unspecified atrial fibrillation: Secondary | ICD-10-CM | POA: Diagnosis present

## 2016-01-24 DIAGNOSIS — I471 Supraventricular tachycardia: Secondary | ICD-10-CM | POA: Diagnosis present

## 2016-01-24 DIAGNOSIS — M5432 Sciatica, left side: Secondary | ICD-10-CM | POA: Diagnosis present

## 2016-01-24 DIAGNOSIS — Z882 Allergy status to sulfonamides status: Secondary | ICD-10-CM | POA: Diagnosis not present

## 2016-01-24 DIAGNOSIS — K769 Liver disease, unspecified: Secondary | ICD-10-CM | POA: Diagnosis not present

## 2016-01-24 DIAGNOSIS — K5651 Intestinal adhesions [bands], with partial obstruction: Secondary | ICD-10-CM | POA: Diagnosis present

## 2016-01-24 DIAGNOSIS — J189 Pneumonia, unspecified organism: Secondary | ICD-10-CM | POA: Diagnosis not present

## 2016-01-24 DIAGNOSIS — R944 Abnormal results of kidney function studies: Secondary | ICD-10-CM | POA: Diagnosis not present

## 2016-01-24 DIAGNOSIS — D72829 Elevated white blood cell count, unspecified: Secondary | ICD-10-CM | POA: Diagnosis not present

## 2016-01-24 DIAGNOSIS — K820 Obstruction of gallbladder: Secondary | ICD-10-CM | POA: Diagnosis not present

## 2016-01-24 DIAGNOSIS — R918 Other nonspecific abnormal finding of lung field: Secondary | ICD-10-CM | POA: Diagnosis not present

## 2016-01-24 DIAGNOSIS — M5431 Sciatica, right side: Secondary | ICD-10-CM | POA: Diagnosis present

## 2016-01-24 DIAGNOSIS — R1084 Generalized abdominal pain: Secondary | ICD-10-CM | POA: Diagnosis not present

## 2016-01-24 DIAGNOSIS — C772 Secondary and unspecified malignant neoplasm of intra-abdominal lymph nodes: Secondary | ICD-10-CM | POA: Diagnosis present

## 2016-01-24 DIAGNOSIS — Z885 Allergy status to narcotic agent status: Secondary | ICD-10-CM | POA: Diagnosis not present

## 2016-01-24 DIAGNOSIS — D62 Acute posthemorrhagic anemia: Secondary | ICD-10-CM | POA: Diagnosis not present

## 2016-01-24 DIAGNOSIS — N179 Acute kidney failure, unspecified: Secondary | ICD-10-CM | POA: Diagnosis not present

## 2016-01-24 DIAGNOSIS — F17210 Nicotine dependence, cigarettes, uncomplicated: Secondary | ICD-10-CM | POA: Diagnosis present

## 2016-01-24 DIAGNOSIS — K56609 Unspecified intestinal obstruction, unspecified as to partial versus complete obstruction: Secondary | ICD-10-CM | POA: Diagnosis not present

## 2016-01-24 DIAGNOSIS — R188 Other ascites: Secondary | ICD-10-CM | POA: Diagnosis not present

## 2016-01-24 DIAGNOSIS — R569 Unspecified convulsions: Secondary | ICD-10-CM | POA: Diagnosis present

## 2016-01-24 DIAGNOSIS — Z4682 Encounter for fitting and adjustment of non-vascular catheter: Secondary | ICD-10-CM | POA: Diagnosis not present

## 2016-02-09 DIAGNOSIS — D72829 Elevated white blood cell count, unspecified: Secondary | ICD-10-CM | POA: Diagnosis not present

## 2016-02-10 DIAGNOSIS — D72829 Elevated white blood cell count, unspecified: Secondary | ICD-10-CM | POA: Diagnosis not present

## 2016-02-13 DIAGNOSIS — Z9889 Other specified postprocedural states: Secondary | ICD-10-CM | POA: Diagnosis not present

## 2016-02-13 DIAGNOSIS — C179 Malignant neoplasm of small intestine, unspecified: Secondary | ICD-10-CM | POA: Diagnosis not present

## 2016-02-23 DIAGNOSIS — D62 Acute posthemorrhagic anemia: Secondary | ICD-10-CM | POA: Diagnosis not present

## 2016-02-23 DIAGNOSIS — M5417 Radiculopathy, lumbosacral region: Secondary | ICD-10-CM | POA: Diagnosis not present

## 2016-02-23 DIAGNOSIS — I4891 Unspecified atrial fibrillation: Secondary | ICD-10-CM | POA: Diagnosis not present

## 2016-02-23 DIAGNOSIS — M549 Dorsalgia, unspecified: Secondary | ICD-10-CM | POA: Diagnosis not present

## 2016-02-23 DIAGNOSIS — Q211 Atrial septal defect: Secondary | ICD-10-CM | POA: Diagnosis not present

## 2016-02-23 DIAGNOSIS — M5442 Lumbago with sciatica, left side: Secondary | ICD-10-CM | POA: Diagnosis not present

## 2016-02-23 DIAGNOSIS — G894 Chronic pain syndrome: Secondary | ICD-10-CM | POA: Diagnosis not present

## 2016-02-23 DIAGNOSIS — Z8049 Family history of malignant neoplasm of other genital organs: Secondary | ICD-10-CM | POA: Diagnosis not present

## 2016-02-23 DIAGNOSIS — D72829 Elevated white blood cell count, unspecified: Secondary | ICD-10-CM | POA: Diagnosis not present

## 2016-02-23 DIAGNOSIS — M5441 Lumbago with sciatica, right side: Secondary | ICD-10-CM | POA: Diagnosis not present

## 2016-02-23 DIAGNOSIS — Z800 Family history of malignant neoplasm of digestive organs: Secondary | ICD-10-CM | POA: Diagnosis not present

## 2016-02-23 DIAGNOSIS — C179 Malignant neoplasm of small intestine, unspecified: Secondary | ICD-10-CM | POA: Diagnosis not present

## 2016-02-23 DIAGNOSIS — K56609 Unspecified intestinal obstruction, unspecified as to partial versus complete obstruction: Secondary | ICD-10-CM | POA: Diagnosis not present

## 2016-02-23 DIAGNOSIS — F17210 Nicotine dependence, cigarettes, uncomplicated: Secondary | ICD-10-CM | POA: Diagnosis not present

## 2016-02-23 DIAGNOSIS — Z809 Family history of malignant neoplasm, unspecified: Secondary | ICD-10-CM | POA: Diagnosis not present

## 2016-02-23 DIAGNOSIS — I471 Supraventricular tachycardia: Secondary | ICD-10-CM | POA: Diagnosis not present

## 2016-02-27 DIAGNOSIS — C179 Malignant neoplasm of small intestine, unspecified: Secondary | ICD-10-CM | POA: Diagnosis not present

## 2016-03-01 DIAGNOSIS — C178 Malignant neoplasm of overlapping sites of small intestine: Secondary | ICD-10-CM | POA: Diagnosis not present

## 2016-03-01 DIAGNOSIS — Z800 Family history of malignant neoplasm of digestive organs: Secondary | ICD-10-CM | POA: Diagnosis not present

## 2016-03-01 DIAGNOSIS — Z8049 Family history of malignant neoplasm of other genital organs: Secondary | ICD-10-CM | POA: Diagnosis not present

## 2016-03-01 DIAGNOSIS — Z807 Family history of other malignant neoplasms of lymphoid, hematopoietic and related tissues: Secondary | ICD-10-CM | POA: Diagnosis not present

## 2016-03-02 DIAGNOSIS — R911 Solitary pulmonary nodule: Secondary | ICD-10-CM | POA: Diagnosis not present

## 2016-03-02 DIAGNOSIS — C179 Malignant neoplasm of small intestine, unspecified: Secondary | ICD-10-CM | POA: Diagnosis not present

## 2016-03-02 DIAGNOSIS — Z886 Allergy status to analgesic agent status: Secondary | ICD-10-CM | POA: Diagnosis not present

## 2016-03-02 DIAGNOSIS — Z888 Allergy status to other drugs, medicaments and biological substances status: Secondary | ICD-10-CM | POA: Diagnosis not present

## 2016-03-02 DIAGNOSIS — R590 Localized enlarged lymph nodes: Secondary | ICD-10-CM | POA: Diagnosis not present

## 2016-03-06 DIAGNOSIS — M5417 Radiculopathy, lumbosacral region: Secondary | ICD-10-CM | POA: Diagnosis not present

## 2016-03-06 DIAGNOSIS — M5442 Lumbago with sciatica, left side: Secondary | ICD-10-CM | POA: Diagnosis not present

## 2016-03-06 DIAGNOSIS — M25551 Pain in right hip: Secondary | ICD-10-CM | POA: Diagnosis not present

## 2016-03-06 DIAGNOSIS — G894 Chronic pain syndrome: Secondary | ICD-10-CM | POA: Diagnosis not present

## 2016-03-08 DIAGNOSIS — G8929 Other chronic pain: Secondary | ICD-10-CM | POA: Diagnosis not present

## 2016-03-08 DIAGNOSIS — I471 Supraventricular tachycardia: Secondary | ICD-10-CM | POA: Diagnosis not present

## 2016-03-08 DIAGNOSIS — C179 Malignant neoplasm of small intestine, unspecified: Secondary | ICD-10-CM | POA: Diagnosis not present

## 2016-03-08 DIAGNOSIS — Z9889 Other specified postprocedural states: Secondary | ICD-10-CM | POA: Diagnosis not present

## 2016-03-08 DIAGNOSIS — Z5111 Encounter for antineoplastic chemotherapy: Secondary | ICD-10-CM | POA: Diagnosis not present

## 2016-03-08 DIAGNOSIS — D72820 Lymphocytosis (symptomatic): Secondary | ICD-10-CM | POA: Diagnosis not present

## 2016-03-13 DIAGNOSIS — F4310 Post-traumatic stress disorder, unspecified: Secondary | ICD-10-CM | POA: Diagnosis not present

## 2016-03-13 DIAGNOSIS — F331 Major depressive disorder, recurrent, moderate: Secondary | ICD-10-CM | POA: Diagnosis not present

## 2016-03-14 DIAGNOSIS — D72820 Lymphocytosis (symptomatic): Secondary | ICD-10-CM | POA: Diagnosis not present

## 2016-03-14 DIAGNOSIS — Z5111 Encounter for antineoplastic chemotherapy: Secondary | ICD-10-CM | POA: Diagnosis not present

## 2016-03-14 DIAGNOSIS — I471 Supraventricular tachycardia: Secondary | ICD-10-CM | POA: Diagnosis not present

## 2016-03-14 DIAGNOSIS — C179 Malignant neoplasm of small intestine, unspecified: Secondary | ICD-10-CM | POA: Diagnosis not present

## 2016-03-15 ENCOUNTER — Encounter: Payer: Self-pay | Admitting: Family Medicine

## 2016-03-15 ENCOUNTER — Ambulatory Visit (INDEPENDENT_AMBULATORY_CARE_PROVIDER_SITE_OTHER): Payer: Medicare Other | Admitting: Family Medicine

## 2016-03-15 ENCOUNTER — Ambulatory Visit (INDEPENDENT_AMBULATORY_CARE_PROVIDER_SITE_OTHER): Payer: Medicare Other

## 2016-03-15 VITALS — BP 132/88 | HR 111 | Ht 65.0 in | Wt 126.0 lb

## 2016-03-15 VITALS — BP 132/88 | HR 111 | Temp 97.9°F | Ht 65.0 in | Wt 126.6 lb

## 2016-03-15 DIAGNOSIS — F322 Major depressive disorder, single episode, severe without psychotic features: Secondary | ICD-10-CM

## 2016-03-15 DIAGNOSIS — Z0000 Encounter for general adult medical examination without abnormal findings: Secondary | ICD-10-CM | POA: Diagnosis not present

## 2016-03-15 DIAGNOSIS — Z6821 Body mass index (BMI) 21.0-21.9, adult: Secondary | ICD-10-CM

## 2016-03-15 DIAGNOSIS — C179 Malignant neoplasm of small intestine, unspecified: Secondary | ICD-10-CM | POA: Diagnosis not present

## 2016-03-15 DIAGNOSIS — K2100 Gastro-esophageal reflux disease with esophagitis, without bleeding: Secondary | ICD-10-CM

## 2016-03-15 DIAGNOSIS — Z1231 Encounter for screening mammogram for malignant neoplasm of breast: Secondary | ICD-10-CM

## 2016-03-15 NOTE — Progress Notes (Signed)
Quick Notes   Health Maintenance:  Pt declines Flu; Pt to call GYN to schedule MMG and Pap. Pt also to call and reschedule her eye exam (missed while she was in the hospital).   Abnormal Screen: None    Patient Concerns:  Pt would like to discuss if you would like her to be on Prevacid. States her insurance stopped coverage, so she has not been taking it.     Nurse Concerns:  Depression Score of 22.

## 2016-03-15 NOTE — Patient Instructions (Addendum)
Mary [name] , Thank you for taking time to come for your Medicare Wellness Visit. I appreciate your ongoing commitment to your health goals. Please review the following plan we discussed and let me know if I can assist you in the future.   Screening recommendations/referrals: Colonoscopy: 09/30/2017 or sooner (depending on your next step) Mammogram:Due now-Contact your GYN for an appt Bone Density: N/A Pap Smear: Due now-contact your GYN for an appt Recommended yearly ophthalmology/optometry visit for glaucoma screening and checkup Recommended yearly dental visit for hygiene and checkup  Vaccinations: Influenza vaccine: Declined Pneumococcal vaccine: N/A Tdap vaccine: 05/07/2018 Shingles vaccine: Due now  Advanced directives: PLEASE BRING A COPY OF HEALTHCARE POWER OF ATTORNEY AT YOUR NEXT VISIT.  Conditions/risks identified: Discussed Depression   Next appointment: Seeing Dr. Madilyn Fireman after this appt.  Preventive Care 40-64 Years, Female Preventive care refers to lifestyle choices and visits with your health care provider that can promote health and wellness. What does preventive care include?  A yearly physical exam. This is also called an annual well check.  Dental exams once or twice a year.  Routine eye exams. Ask your health care provider how often you should have your eyes checked.  Personal lifestyle choices, including:  Daily care of your teeth and gums.  Regular physical activity.  Eating a healthy diet.  Avoiding tobacco and drug use.  Limiting alcohol use.  Practicing safe sex.  Taking low-dose aspirin daily starting at age 61.  Taking vitamin and mineral supplements as recommended by your health care provider. What happens during an annual well check? The services and screenings done by your health care provider during your annual well check will depend on your age, overall health, lifestyle risk factors, and family history of disease. Counseling  Your  health care provider may ask you questions about your:  Alcohol use.  Tobacco use.  Drug use.  Emotional well-being.  Home and relationship well-being.  Sexual activity.  Eating habits.  Work and work Statistician.  Method of birth control.  Menstrual cycle.  Pregnancy history. Screening  You may have the following tests or measurements:  Height, weight, and BMI.  Blood pressure.  Lipid and cholesterol levels. These may be checked every 5 years, or more frequently if you are over 29 years old.  Skin check.  Lung cancer screening. You may have this screening every year starting at age 31 if you have a 30-pack-year history of smoking and currently smoke or have quit within the past 15 years.  Fecal occult blood test (FOBT) of the stool. You may have this test every year starting at age 3.  Flexible sigmoidoscopy or colonoscopy. You may have a sigmoidoscopy every 5 years or a colonoscopy every 10 years starting at age 85.  Hepatitis C blood test.  Hepatitis B blood test.  Sexually transmitted disease (STD) testing.  Diabetes screening. This is done by checking your blood sugar (glucose) after you have not eaten for a while (fasting). You may have this done every 1-3 years.  Mammogram. This may be done every 1-2 years. Talk to your health care provider about when you should start having regular mammograms. This may depend on whether you have a family history of breast cancer.  BRCA-related cancer screening. This may be done if you have a family history of breast, ovarian, tubal, or peritoneal cancers.  Pelvic exam and Pap test. This may be done every 3 years starting at age 47. Starting at age 45, this may be done  every 5 years if you have a Pap test in combination with an HPV test.  Bone density scan. This is done to screen for osteoporosis. You may have this scan if you are at high risk for osteoporosis. Discuss your test results, treatment options, and if  necessary, the need for more tests with your health care provider. Vaccines  Your health care provider may recommend certain vaccines, such as:  Influenza vaccine. This is recommended every year.  Tetanus, diphtheria, and acellular pertussis (Tdap, Td) vaccine. You may need a Td booster every 10 years.  Zoster vaccine. You may need this after age 75.  Pneumococcal 13-valent conjugate (PCV13) vaccine. You may need this if you have certain conditions and were not previously vaccinated.  Pneumococcal polysaccharide (PPSV23) vaccine. You may need one or two doses if you smoke cigarettes or if you have certain conditions. Talk to your health care provider about which screenings and vaccines you need and how often you need them. This information is not intended to replace advice given to you by your health care provider. Make sure you discuss any questions you have with your health care provider. Document Released: 01/14/2015 Document Revised: 09/07/2015 Document Reviewed: 10/19/2014 Elsevier Interactive Patient Education  2017 [place] Prevention in the Home Falls can cause injuries. They can happen to people of all ages. There are many things you can do to make your home safe and to help prevent falls. What can I do on the outside of my home?  Regularly fix the edges of walkways and driveways and fix any cracks.  Remove anything that might make you trip as you walk through a door, such as a raised step or threshold.  Trim any bushes or trees on the path to your home.  Use bright outdoor lighting.  Clear any walking paths of anything that might make someone trip, such as rocks or tools.  Regularly check to see if handrails are loose or broken. Make sure that both sides of any steps have handrails.  Any raised decks and porches should have guardrails on the edges.  Have any leaves, snow, or ice cleared regularly.  Use sand or salt on walking paths during  winter.  Clean up any spills in your garage right away. This includes oil or grease spills. What can I do in the bathroom?  Use night lights.  Install grab bars by the toilet and in the tub and shower. Do not use towel bars as grab bars.  Use non-skid mats or decals in the tub or shower.  If you need to sit down in the shower, use a plastic, non-slip stool.  Keep the floor dry. Clean up any water that spills on the floor as soon as it happens.  Remove soap buildup in the tub or shower regularly.  Attach bath mats securely with double-sided non-slip rug tape.  Do not have throw rugs and other things on the floor that can make you trip. What can I do in the bedroom?  Use night lights.  Make sure that you have a light by your bed that is easy to reach.  Do not use any sheets or blankets that are too big for your bed. They should not hang down onto the floor.  Have a firm chair that has side arms. You can use this for support while you get dressed.  Do not have throw rugs and other things on the floor that can make you trip. What can  I do in the kitchen?  Clean up any spills right away.  Avoid walking on wet floors.  Keep items that you use a lot in easy-to-reach places.  If you need to reach something above you, use a strong step stool that has a grab bar.  Keep electrical cords out of the way.  Do not use floor polish or wax that makes floors slippery. If you must use wax, use non-skid floor wax.  Do not have throw rugs and other things on the floor that can make you trip. What can I do with my stairs?  Do not leave any items on the stairs.  Make sure that there are handrails on both sides of the stairs and use them. Fix handrails that are broken or loose. Make sure that handrails are as long as the stairways.  Check any carpeting to make sure that it is firmly attached to the stairs. Fix any carpet that is loose or worn.  Avoid having throw rugs at the top or  bottom of the stairs. If you do have throw rugs, attach them to the floor with carpet tape.  Make sure that you have a light switch at the top of the stairs and the bottom of the stairs. If you do not have them, ask someone to add them for you. What else can I do to help prevent falls?  Wear shoes that:  Do not have high heels.  Have rubber bottoms.  Are comfortable and fit you well.  Are closed at the toe. Do not wear sandals.  If you use a stepladder:  Make sure that it is fully opened. Do not climb a closed stepladder.  Make sure that both sides of the stepladder are locked into place.  Ask someone to hold it for you, if possible.  Clearly mark and make sure that you can see:  Any grab bars or handrails.  First and last steps.  Where the edge of each step is.  Use tools that help you move around (mobility aids) if they are needed. These include:  Canes.  Walkers.  Scooters.  Crutches.  Turn on the lights when you go into a dark area. Replace any light bulbs as soon as they burn out.  Set up your furniture so you have a clear path. Avoid moving your furniture around.  If any of your floors are uneven, fix them.  If there are any pets around you, be aware of where they are.  Review your medicines with your doctor. Some medicines can make you feel dizzy. This can increase your chance of falling. Ask your doctor what other things that you can do to help prevent falls. This information is not intended to replace advice given to you by your health care provider. Make sure you discuss any questions you have with your health care provider. Document Released: 10/14/2008 Document Revised: 05/26/2015 Document Reviewed: 01/22/2014 Elsevier Interactive Patient Education  2017 [employer].

## 2016-03-15 NOTE — Progress Notes (Addendum)
Subjective:    Patient ID: Mary [name], female    DOB: Jun 28, 1965, 51 y.o.   MRN: [id number]  HPI She comes in today because she's been feeling more sad and down. 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 She is actually want to start chemotherapy next Wednesday after getting her port placed on Monday. She is currently taking Fetzima for major depressive disorder and posterior medical stress. She also uses clonazepam as needed at bedtime and is currently on a stimulant for ADD. She says someone had reached out to her about maybe starting group therapy but she just hasn't really had a chance to connect back with them. Right now she just has some any appointments. I also want her to get her mammogram and her Pap smear updated. She typically gets her Pap smear done with Dr. Gala Romney down the hall and normally gets her mammogram done downstairs.  GERD-she did want to let me know that she is off her PPI. When she was there during hospitalization she was discharged home without it. She says she's actually been doing fairly well without it but has taken Zantac a couple of times.   Review of Systems  BP 132/88   Pulse (!) 111   Ht 5\' 5"  (1.651 m)   Wt 126 lb (57.2 kg)   BMI 20.97 kg/m     Allergies  Allergen Reactions  . Albuterol Other (See Comments)    REACTION: shakey, tachycardic, arrythmia, chest pain  . Atenolol Nausea And Vomiting    Decreased BP  . Benzoyl Peroxide Swelling    Topical--eyes swell shut  . Codeine Other (See Comments)    Arrythmia but can take percocet  . Cyclobenzaprine Hcl Nausea Only  . Diphenhydramine Hcl Other (See Comments)    "drugged feeling"  . Erythromycin Nausea And Vomiting  . Fentanyl Other (See Comments)    Patch -made her get low  BP,  Can get IV w/o problem  . Remeron [Mirtazapine] Itching    Swelling of tongue, jerking, can't wake up, feel drugged  . Sulfonamide Derivatives Swelling and Rash    Itching swelling visit ER    Past Medical History:  Diagnosis Date  . Anxiety   . Complication of anesthesia    "hard to put to sleep", woke up during procedure in the past  . Diverticulosis of colon (without mention of hemorrhage)   . Herniated disc   . Mental disorder    PTSD  . MVP (mitral valve prolapse)   . PAF (paroxysmal atrial fibrillation) ([place])   . Personal history of colonic polyps 06/26/2007   tubular adenoma  . Shortness of breath    Due to Dilaudid and Morphine  . Small bowel cancer ([place]) 01/23/2016  . SVT (supraventricular tachycardia) ([facility])    hx of ablation    Past Surgical History:  Procedure Laterality Date  . BACK SURGERY     several  . CARDIAC ELECTROPHYSIOLOGY MAPPING AND ABLATION    . CARDIAC ELECTROPHYSIOLOGY MAPPING AND ABLATION  12/2011  . COLON SURGERY  01/23/2016  . COLONOSCOPY    . ENDOMETRIAL ABLATION  04/19/11  . TONSILLECTOMY    . URETHRA SURGERY      Social History   Social History  . Marital status: Widowed  Spouse name: N/A  . Number of children: N/A  . Years of education: N/A   Occupational History  . Not on file.   Social History Main Topics  . Smoking status: Current Every Day Smoker    Packs/day: 0.80    Years: 28.00    Types: Cigarettes  . Smokeless tobacco: Never Used     Comment: started smoking at age 66. Counseling sheet given in exam room to quit smoking 02-13-2011; 03/15/16- quit smoking cigs, only vaping  . Alcohol use Yes     Comment: rarely  . Drug use: No  . Sexual activity: Yes   Other Topics Concern  . Not on file   Social History Narrative  . No narrative on file    Family History  Problem Relation Age of Onset  . Coronary artery disease Father   . Hypertension Father   . Colon cancer Father   . Neuropathy Mother   . Stomach  cancer Paternal Aunt     Outpatient Encounter Prescriptions as of 03/15/2016  Medication Sig  . [START ON 03/17/2016] Buprenorphine HCl 600 MCG FILM Place inside cheek.  . clonazePAM (KLONOPIN) 1 MG tablet Take 1 to 2 tabs at night  . lansoprazole (PREVACID) 30 MG capsule Take by mouth.  . Levomilnacipran HCl ER 80 MG CP24 Take by mouth.  . loperamide (IMODIUM) 2 MG capsule Take by mouth.  . methylphenidate (RITALIN) 10 MG tablet Take 30 mg by mouth 2 (two) times daily.  . metoprolol succinate (TOPROL-XL) 25 MG 24 hr tablet Take 25 mg by mouth daily.  . midodrine (PROAMATINE) 2.5 MG tablet Take 2.5 mg by mouth daily.  . Multiple Vitamin (MULTIVITAMIN) tablet Take by mouth.  . ondansetron (ZOFRAN) 8 MG tablet Take by mouth.  . topiramate (TOPAMAX) 50 MG tablet TK 1 T PO TID WITH FOOD  . [DISCONTINUED] Levomilnacipran HCl ER 40 MG CP24 Take 40 mg by mouth.  . [DISCONTINUED] Buprenorphine HCl (BELBUCA) 600 MCG FILM Place inside cheek.  . [DISCONTINUED] clindamycin (CLEOCIN) 300 MG capsule Take 1 capsule (300 mg total) by mouth 3 (three) times daily.  . [DISCONTINUED] doxycycline (VIBRAMYCIN) 100 MG capsule Take 1 capsule (100 mg total) by mouth 2 (two) times daily. One po bid x 7 days  . [DISCONTINUED] metroNIDAZOLE (FLAGYL) 500 MG tablet Take 1 tablet (500 mg total) by mouth 3 (three) times daily. (every 8 hours)  . [DISCONTINUED] oxymorphone (OPANA ER) 20 MG 12 hr tablet Take 20 mg by mouth every 12 (twelve) hours.   No facility-administered encounter medications on file as of 03/15/2016.          Objective:   Physical Exam  Constitutional: She is oriented to person, place, and time. She appears well-developed and well-nourished.  HENT:  Head: Normocephalic and atraumatic.  Neck: Neck supple. No thyromegaly present.  Cardiovascular: Normal rate, regular rhythm and normal heart sounds.   Pulmonary/Chest: Effort normal and breath sounds normal.  Lymphadenopathy:    She has no cervical  adenopathy.  Neurological: She is alert and oriented to person, place, and time.  Skin: Skin is warm and dry.  Psychiatric: She has a normal mood and affect. Her behavior is normal.       Assessment & Plan:  Adenocarcinoma of the small bowel - I did strongly encourage her to get into some type of therapy or counseling. Right now she is being offered some some services through the geneticist. I think this can be very helpful for her as  this is a life-changing diagnosis. They also think that she could have Lynch syndrome and so she is being evaluated for this as well.  Went ahead and ordered her mammogram today so that she can get that scheduled promptly. She does have a history of dense breast tissue. Also encouraged her to walk down to Dr. Roseanne Kaufman office today and go ahead and get scheduled for a Pap smear.  Acute depression-PHQ 9 score of 16 today.  She is already on Fetzima.   GERD-if doing well without her PPI then continue to hold. Can just use Zantac as needed but if she's fine she is using it frequently then we may need to restart it. Unfortunately her insurance will no longer pay for the pantoprazole so we may have to switch to a different one.

## 2016-03-15 NOTE — Progress Notes (Signed)
Reviewed and agree with above.  Catherine Metheney, MD  

## 2016-03-15 NOTE — Progress Notes (Signed)
Subjective:   Mary [name] is a 51 y.o. female who presents for Medicare Annual (Subsequent) preventive examination.  Review of Systems:  Cardiac Risk Factors include: Other (see comment);family history of premature cardiovascular disease;sedentary lifestyle (A-Fib)     Objective:     Vitals: BP 132/88 (BP Location: Right Arm, Patient Position: Sitting, Cuff Size: Normal)   Pulse (!) 111   Temp 97.9 F (36.6 C) (Oral)   Ht 5\' 5"  (1.651 m)   Wt 126 lb 9.6 oz (57.4 kg)   SpO2 99%   BMI 21.07 kg/m   Body mass index is 21.07 kg/m.   Tobacco History  Smoking Status  . Current Every Day Smoker  . Packs/day: 0.80  . Years: 28.00  . Types: Cigarettes  Smokeless Tobacco  . Never Used    Comment: started smoking at age 10. Counseling sheet given in exam room to quit smoking 02-13-2011; 03/15/16- quit smoking cigs, only vaping     Ready to quit: No Counseling given: No   Past Medical History:  Diagnosis Date  . Anxiety   . Complication of anesthesia    "hard to put to sleep", woke up during procedure in the past  . Diverticulosis of colon (without mention of hemorrhage)   . Herniated disc   . Mental disorder    PTSD  . MVP (mitral valve prolapse)   . PAF (paroxysmal atrial fibrillation) ([place])   . Personal history of colonic polyps 06/26/2007   tubular adenoma  . Shortness of breath    Due to Dilaudid and Morphine  . Small bowel cancer ([place]) 01/23/2016  . SVT (supraventricular tachycardia) ([facility])    hx of ablation   Past Surgical History:  Procedure Laterality Date  . BACK SURGERY     several  . CARDIAC ELECTROPHYSIOLOGY MAPPING AND ABLATION    . CARDIAC ELECTROPHYSIOLOGY MAPPING AND ABLATION  12/2011  . COLON SURGERY  01/23/2016  . COLONOSCOPY    . ENDOMETRIAL ABLATION  04/19/11  . TONSILLECTOMY    . URETHRA SURGERY     Family History  Problem Relation Age of Onset  . Coronary artery disease Father   . Hypertension Father   . Colon cancer Father   .  Neuropathy Mother   . Stomach cancer Paternal Aunt    History  Sexual Activity  . Sexual activity: Yes    Outpatient Encounter Prescriptions as of 03/15/2016  Medication Sig  . Buprenorphine HCl (BELBUCA) 600 MCG FILM Place inside cheek.  . Levomilnacipran HCl ER (FETZIMA) 80 MG CP24 Take 1 capsule by mouth daily.  . methylphenidate (RITALIN) 10 MG tablet Take 30 mg by mouth 2 (two) times daily.  . metoprolol succinate (TOPROL-XL) 25 MG 24 hr tablet Take 25 mg by mouth daily.  . midodrine (PROAMATINE) 2.5 MG tablet Take 2.5 mg by mouth daily.  . Multiple Vitamin (MULTIVITAMIN) tablet Take 1 tablet by mouth daily.  . [DISCONTINUED] clindamycin (CLEOCIN) 300 MG capsule Take 1 capsule (300 mg total) by mouth 3 (three) times daily.  . [DISCONTINUED] doxycycline (VIBRAMYCIN) 100 MG capsule Take 1 capsule (100 mg total) by mouth 2 (two) times daily. One po bid x 7 days  . [DISCONTINUED] metroNIDAZOLE (FLAGYL) 500 MG tablet Take 1 tablet (500 mg total) by mouth 3 (three) times daily. (every 8 hours)  . [DISCONTINUED] oxymorphone (OPANA ER) 20 MG 12 hr tablet Take 20 mg by mouth every 12 (twelve) hours.   No facility-administered encounter medications on file as of 03/15/2016.  Activities of Daily Living In your present state of health, do you have any difficulty performing the following activities: 03/15/2016  Hearing? N  Vision? N  Difficulty concentrating or making decisions? Y  Walking or climbing stairs? Y  Dressing or bathing? N  Doing errands, shopping? N  Preparing Food and eating ? N  Using the Toilet? N  In the past six months, have you accidently leaked urine? N  Do you have problems with loss of bowel control? Y  Managing your Medications? N  Managing your Finances? N  Housekeeping or managing your Housekeeping? N  Some recent data might be hidden    Patient Care Team: Hali Marry, MD as PCP - General (Family Medicine) Elsie Stain, MD (Pulmonary  Disease) Wendie Chess, MD (Cardiology)    Assessment:    Exercise Activities and Dietary recommendations Current Exercise Habits: The patient does not participate in regular exercise at present, Exercise limited by: cardiac condition(s);orthopedic condition(s)  Goals    . <enter goal here>          Starting 03/15/16, I will be concentrating the next several months dealing with chemotherapy and my recent colon cancer.       Fall Risk Fall Risk  03/15/2016  Falls in the past year? No   Depression Screen PHQ 2/9 Scores 03/15/2016  PHQ - 2 Score 6  PHQ- 9 Score 22     Cognitive Function MMSE - Mini Mental State Exam 03/15/2016  Not completed: Unable to complete        Immunization History  Administered Date(s) Administered  . Influenza Split 11/09/2010, 12/04/2011  . Influenza Whole 11/14/2006, 11/11/2007, 11/15/2008  . Influenza,inj,Quad PF,36+ Mos 12/01/2013  . Pneumococcal Polysaccharide-23 04/05/2011  . Td 05/06/2008   Screening Tests Health Maintenance  Topic Date Due  . PAP SMEAR  01/23/2014  . INFLUENZA VACCINE  08/02/2015  . COLONOSCOPY  09/30/2017  . TETANUS/TDAP  05/07/2018  . HIV Screening  Completed      Plan:    I have personally reviewed and addressed the Medicare Annual Wellness questionnaire and have noted the following in the patient's chart:  A. Medical and social history B. Use of alcohol, tobacco or illicit drugs  C. Current medications and supplements D. Functional ability and status E.  Nutritional status F.  Physical activity G. Advance directives H. List of other physicians I.  Hospitalizations, surgeries, and ER visits in previous 12 months J.  [place] to include hearing, vision, cognitive, depression L. Referrals and appointments - none  In addition, I have reviewed and discussed with patient certain preventive protocols, quality metrics, and best practice recommendations. A written personalized care plan for  preventive services as well as general preventive health recommendations were provided to patient.  See attached scanned questionnaire for additional information.   Signed,   Allyn Kenner, LPN Health Advisor

## 2016-03-19 DIAGNOSIS — Z800 Family history of malignant neoplasm of digestive organs: Secondary | ICD-10-CM | POA: Diagnosis not present

## 2016-03-19 DIAGNOSIS — Z86010 Personal history of colonic polyps: Secondary | ICD-10-CM | POA: Diagnosis not present

## 2016-03-19 DIAGNOSIS — M1990 Unspecified osteoarthritis, unspecified site: Secondary | ICD-10-CM | POA: Diagnosis not present

## 2016-03-19 DIAGNOSIS — Z87891 Personal history of nicotine dependence: Secondary | ICD-10-CM | POA: Diagnosis not present

## 2016-03-19 DIAGNOSIS — C189 Malignant neoplasm of colon, unspecified: Secondary | ICD-10-CM | POA: Diagnosis not present

## 2016-03-19 DIAGNOSIS — K219 Gastro-esophageal reflux disease without esophagitis: Secondary | ICD-10-CM | POA: Diagnosis not present

## 2016-03-19 DIAGNOSIS — Z885 Allergy status to narcotic agent status: Secondary | ICD-10-CM | POA: Diagnosis not present

## 2016-03-19 DIAGNOSIS — Z888 Allergy status to other drugs, medicaments and biological substances status: Secondary | ICD-10-CM | POA: Diagnosis not present

## 2016-03-19 DIAGNOSIS — I10 Essential (primary) hypertension: Secondary | ICD-10-CM | POA: Diagnosis not present

## 2016-03-19 DIAGNOSIS — Z883 Allergy status to other anti-infective agents status: Secondary | ICD-10-CM | POA: Diagnosis not present

## 2016-03-19 DIAGNOSIS — F329 Major depressive disorder, single episode, unspecified: Secondary | ICD-10-CM | POA: Diagnosis not present

## 2016-03-19 DIAGNOSIS — C179 Malignant neoplasm of small intestine, unspecified: Secondary | ICD-10-CM | POA: Diagnosis not present

## 2016-03-19 DIAGNOSIS — Z4682 Encounter for fitting and adjustment of non-vascular catheter: Secondary | ICD-10-CM | POA: Diagnosis not present

## 2016-03-19 DIAGNOSIS — I4891 Unspecified atrial fibrillation: Secondary | ICD-10-CM | POA: Diagnosis not present

## 2016-03-21 DIAGNOSIS — Z5111 Encounter for antineoplastic chemotherapy: Secondary | ICD-10-CM | POA: Diagnosis not present

## 2016-03-21 DIAGNOSIS — D72820 Lymphocytosis (symptomatic): Secondary | ICD-10-CM | POA: Diagnosis not present

## 2016-03-21 DIAGNOSIS — Z5181 Encounter for therapeutic drug level monitoring: Secondary | ICD-10-CM | POA: Diagnosis not present

## 2016-03-21 DIAGNOSIS — D72829 Elevated white blood cell count, unspecified: Secondary | ICD-10-CM | POA: Diagnosis not present

## 2016-03-21 DIAGNOSIS — Q211 Atrial septal defect: Secondary | ICD-10-CM | POA: Diagnosis not present

## 2016-03-21 DIAGNOSIS — F17200 Nicotine dependence, unspecified, uncomplicated: Secondary | ICD-10-CM | POA: Diagnosis not present

## 2016-03-21 DIAGNOSIS — G894 Chronic pain syndrome: Secondary | ICD-10-CM | POA: Diagnosis not present

## 2016-03-21 DIAGNOSIS — C179 Malignant neoplasm of small intestine, unspecified: Secondary | ICD-10-CM | POA: Diagnosis not present

## 2016-03-21 DIAGNOSIS — M549 Dorsalgia, unspecified: Secondary | ICD-10-CM | POA: Diagnosis not present

## 2016-03-21 DIAGNOSIS — R55 Syncope and collapse: Secondary | ICD-10-CM | POA: Diagnosis not present

## 2016-03-21 DIAGNOSIS — Z79899 Other long term (current) drug therapy: Secondary | ICD-10-CM | POA: Diagnosis not present

## 2016-03-21 DIAGNOSIS — I471 Supraventricular tachycardia: Secondary | ICD-10-CM | POA: Diagnosis not present

## 2016-03-21 DIAGNOSIS — R000 Tachycardia, unspecified: Secondary | ICD-10-CM | POA: Diagnosis not present

## 2016-03-23 DIAGNOSIS — I471 Supraventricular tachycardia: Secondary | ICD-10-CM | POA: Diagnosis not present

## 2016-03-23 DIAGNOSIS — Z5111 Encounter for antineoplastic chemotherapy: Secondary | ICD-10-CM | POA: Diagnosis not present

## 2016-03-23 DIAGNOSIS — C179 Malignant neoplasm of small intestine, unspecified: Secondary | ICD-10-CM | POA: Diagnosis not present

## 2016-03-23 DIAGNOSIS — D72820 Lymphocytosis (symptomatic): Secondary | ICD-10-CM | POA: Diagnosis not present

## 2016-03-28 DIAGNOSIS — D72820 Lymphocytosis (symptomatic): Secondary | ICD-10-CM | POA: Diagnosis not present

## 2016-03-28 DIAGNOSIS — I471 Supraventricular tachycardia: Secondary | ICD-10-CM | POA: Diagnosis not present

## 2016-03-28 DIAGNOSIS — Z5111 Encounter for antineoplastic chemotherapy: Secondary | ICD-10-CM | POA: Diagnosis not present

## 2016-03-28 DIAGNOSIS — C179 Malignant neoplasm of small intestine, unspecified: Secondary | ICD-10-CM | POA: Diagnosis not present

## 2016-04-04 DIAGNOSIS — D72829 Elevated white blood cell count, unspecified: Secondary | ICD-10-CM | POA: Diagnosis not present

## 2016-04-04 DIAGNOSIS — Z5111 Encounter for antineoplastic chemotherapy: Secondary | ICD-10-CM | POA: Diagnosis not present

## 2016-04-04 DIAGNOSIS — M5417 Radiculopathy, lumbosacral region: Secondary | ICD-10-CM | POA: Diagnosis not present

## 2016-04-04 DIAGNOSIS — C179 Malignant neoplasm of small intestine, unspecified: Secondary | ICD-10-CM | POA: Diagnosis not present

## 2016-04-04 DIAGNOSIS — G894 Chronic pain syndrome: Secondary | ICD-10-CM | POA: Diagnosis not present

## 2016-04-04 DIAGNOSIS — M5441 Lumbago with sciatica, right side: Secondary | ICD-10-CM | POA: Diagnosis not present

## 2016-04-04 DIAGNOSIS — R151 Fecal smearing: Secondary | ICD-10-CM | POA: Diagnosis not present

## 2016-04-04 DIAGNOSIS — D72821 Monocytosis (symptomatic): Secondary | ICD-10-CM | POA: Diagnosis not present

## 2016-04-04 DIAGNOSIS — M25551 Pain in right hip: Secondary | ICD-10-CM | POA: Diagnosis not present

## 2016-04-04 DIAGNOSIS — M5442 Lumbago with sciatica, left side: Secondary | ICD-10-CM | POA: Diagnosis not present

## 2016-04-04 DIAGNOSIS — G8929 Other chronic pain: Secondary | ICD-10-CM | POA: Diagnosis not present

## 2016-04-04 DIAGNOSIS — M25552 Pain in left hip: Secondary | ICD-10-CM | POA: Diagnosis not present

## 2016-04-04 DIAGNOSIS — D72820 Lymphocytosis (symptomatic): Secondary | ICD-10-CM | POA: Diagnosis not present

## 2016-04-04 DIAGNOSIS — K5900 Constipation, unspecified: Secondary | ICD-10-CM | POA: Diagnosis not present

## 2016-04-06 DIAGNOSIS — D72829 Elevated white blood cell count, unspecified: Secondary | ICD-10-CM | POA: Diagnosis not present

## 2016-04-06 DIAGNOSIS — D72821 Monocytosis (symptomatic): Secondary | ICD-10-CM | POA: Diagnosis not present

## 2016-04-06 DIAGNOSIS — C179 Malignant neoplasm of small intestine, unspecified: Secondary | ICD-10-CM | POA: Diagnosis not present

## 2016-04-06 DIAGNOSIS — Z5111 Encounter for antineoplastic chemotherapy: Secondary | ICD-10-CM | POA: Diagnosis not present

## 2016-04-09 DIAGNOSIS — M25551 Pain in right hip: Secondary | ICD-10-CM | POA: Diagnosis not present

## 2016-04-09 DIAGNOSIS — G894 Chronic pain syndrome: Secondary | ICD-10-CM | POA: Diagnosis not present

## 2016-04-09 DIAGNOSIS — M5442 Lumbago with sciatica, left side: Secondary | ICD-10-CM | POA: Diagnosis not present

## 2016-04-09 DIAGNOSIS — Z79899 Other long term (current) drug therapy: Secondary | ICD-10-CM | POA: Diagnosis not present

## 2016-04-09 DIAGNOSIS — M5417 Radiculopathy, lumbosacral region: Secondary | ICD-10-CM | POA: Diagnosis not present

## 2016-04-09 DIAGNOSIS — Z5181 Encounter for therapeutic drug level monitoring: Secondary | ICD-10-CM | POA: Diagnosis not present

## 2016-04-16 DIAGNOSIS — Z1509 Genetic susceptibility to other malignant neoplasm: Secondary | ICD-10-CM | POA: Insufficient documentation

## 2016-04-18 DIAGNOSIS — M5441 Lumbago with sciatica, right side: Secondary | ICD-10-CM | POA: Diagnosis not present

## 2016-04-18 DIAGNOSIS — D696 Thrombocytopenia, unspecified: Secondary | ICD-10-CM | POA: Diagnosis not present

## 2016-04-18 DIAGNOSIS — C179 Malignant neoplasm of small intestine, unspecified: Secondary | ICD-10-CM | POA: Diagnosis not present

## 2016-04-18 DIAGNOSIS — R000 Tachycardia, unspecified: Secondary | ICD-10-CM | POA: Diagnosis not present

## 2016-04-18 DIAGNOSIS — G8929 Other chronic pain: Secondary | ICD-10-CM | POA: Diagnosis not present

## 2016-04-18 DIAGNOSIS — D72829 Elevated white blood cell count, unspecified: Secondary | ICD-10-CM | POA: Diagnosis not present

## 2016-04-18 DIAGNOSIS — M5442 Lumbago with sciatica, left side: Secondary | ICD-10-CM | POA: Diagnosis not present

## 2016-04-18 DIAGNOSIS — G894 Chronic pain syndrome: Secondary | ICD-10-CM | POA: Diagnosis not present

## 2016-04-18 DIAGNOSIS — Z5111 Encounter for antineoplastic chemotherapy: Secondary | ICD-10-CM | POA: Diagnosis not present

## 2016-04-18 DIAGNOSIS — D72820 Lymphocytosis (symptomatic): Secondary | ICD-10-CM | POA: Diagnosis not present

## 2016-04-18 DIAGNOSIS — Z1509 Genetic susceptibility to other malignant neoplasm: Secondary | ICD-10-CM | POA: Diagnosis not present

## 2016-04-18 DIAGNOSIS — D72821 Monocytosis (symptomatic): Secondary | ICD-10-CM | POA: Diagnosis not present

## 2016-04-18 DIAGNOSIS — R151 Fecal smearing: Secondary | ICD-10-CM | POA: Diagnosis not present

## 2016-04-20 DIAGNOSIS — Z5111 Encounter for antineoplastic chemotherapy: Secondary | ICD-10-CM | POA: Diagnosis not present

## 2016-04-20 DIAGNOSIS — D72821 Monocytosis (symptomatic): Secondary | ICD-10-CM | POA: Diagnosis not present

## 2016-04-20 DIAGNOSIS — D72829 Elevated white blood cell count, unspecified: Secondary | ICD-10-CM | POA: Diagnosis not present

## 2016-04-20 DIAGNOSIS — C179 Malignant neoplasm of small intestine, unspecified: Secondary | ICD-10-CM | POA: Diagnosis not present

## 2016-04-25 DIAGNOSIS — D72821 Monocytosis (symptomatic): Secondary | ICD-10-CM | POA: Diagnosis not present

## 2016-04-25 DIAGNOSIS — D72829 Elevated white blood cell count, unspecified: Secondary | ICD-10-CM | POA: Diagnosis not present

## 2016-04-25 DIAGNOSIS — Z5111 Encounter for antineoplastic chemotherapy: Secondary | ICD-10-CM | POA: Diagnosis not present

## 2016-04-25 DIAGNOSIS — C179 Malignant neoplasm of small intestine, unspecified: Secondary | ICD-10-CM | POA: Diagnosis not present

## 2016-05-02 DIAGNOSIS — T451X5A Adverse effect of antineoplastic and immunosuppressive drugs, initial encounter: Secondary | ICD-10-CM | POA: Diagnosis not present

## 2016-05-02 DIAGNOSIS — D72821 Monocytosis (symptomatic): Secondary | ICD-10-CM | POA: Diagnosis not present

## 2016-05-02 DIAGNOSIS — D721 Eosinophilia: Secondary | ICD-10-CM | POA: Diagnosis not present

## 2016-05-02 DIAGNOSIS — C189 Malignant neoplasm of colon, unspecified: Secondary | ICD-10-CM | POA: Diagnosis not present

## 2016-05-02 DIAGNOSIS — Z79899 Other long term (current) drug therapy: Secondary | ICD-10-CM | POA: Diagnosis not present

## 2016-05-02 DIAGNOSIS — Z5111 Encounter for antineoplastic chemotherapy: Secondary | ICD-10-CM | POA: Diagnosis not present

## 2016-05-02 DIAGNOSIS — Z5181 Encounter for therapeutic drug level monitoring: Secondary | ICD-10-CM | POA: Diagnosis not present

## 2016-05-02 DIAGNOSIS — Z1509 Genetic susceptibility to other malignant neoplasm: Secondary | ICD-10-CM | POA: Diagnosis not present

## 2016-05-02 DIAGNOSIS — C179 Malignant neoplasm of small intestine, unspecified: Secondary | ICD-10-CM | POA: Diagnosis not present

## 2016-05-02 DIAGNOSIS — K5900 Constipation, unspecified: Secondary | ICD-10-CM | POA: Diagnosis not present

## 2016-05-04 DIAGNOSIS — C179 Malignant neoplasm of small intestine, unspecified: Secondary | ICD-10-CM | POA: Diagnosis not present

## 2016-05-04 DIAGNOSIS — C189 Malignant neoplasm of colon, unspecified: Secondary | ICD-10-CM | POA: Diagnosis not present

## 2016-05-04 DIAGNOSIS — Z5111 Encounter for antineoplastic chemotherapy: Secondary | ICD-10-CM | POA: Diagnosis not present

## 2016-05-04 DIAGNOSIS — Z79899 Other long term (current) drug therapy: Secondary | ICD-10-CM | POA: Diagnosis not present

## 2016-05-04 DIAGNOSIS — Z1509 Genetic susceptibility to other malignant neoplasm: Secondary | ICD-10-CM | POA: Diagnosis not present

## 2016-05-04 DIAGNOSIS — D72821 Monocytosis (symptomatic): Secondary | ICD-10-CM | POA: Diagnosis not present

## 2016-05-07 DIAGNOSIS — M5442 Lumbago with sciatica, left side: Secondary | ICD-10-CM | POA: Diagnosis not present

## 2016-05-07 DIAGNOSIS — G894 Chronic pain syndrome: Secondary | ICD-10-CM | POA: Diagnosis not present

## 2016-05-07 DIAGNOSIS — M5417 Radiculopathy, lumbosacral region: Secondary | ICD-10-CM | POA: Diagnosis not present

## 2016-05-07 DIAGNOSIS — M25551 Pain in right hip: Secondary | ICD-10-CM | POA: Diagnosis not present

## 2016-05-09 DIAGNOSIS — Z1509 Genetic susceptibility to other malignant neoplasm: Secondary | ICD-10-CM | POA: Diagnosis not present

## 2016-05-09 DIAGNOSIS — Z5111 Encounter for antineoplastic chemotherapy: Secondary | ICD-10-CM | POA: Diagnosis not present

## 2016-05-09 DIAGNOSIS — Z79899 Other long term (current) drug therapy: Secondary | ICD-10-CM | POA: Diagnosis not present

## 2016-05-09 DIAGNOSIS — D72821 Monocytosis (symptomatic): Secondary | ICD-10-CM | POA: Diagnosis not present

## 2016-05-09 DIAGNOSIS — C189 Malignant neoplasm of colon, unspecified: Secondary | ICD-10-CM | POA: Diagnosis not present

## 2016-05-09 DIAGNOSIS — C179 Malignant neoplasm of small intestine, unspecified: Secondary | ICD-10-CM | POA: Diagnosis not present

## 2016-05-15 DIAGNOSIS — R000 Tachycardia, unspecified: Secondary | ICD-10-CM | POA: Diagnosis not present

## 2016-05-15 DIAGNOSIS — I471 Supraventricular tachycardia: Secondary | ICD-10-CM | POA: Diagnosis not present

## 2016-05-15 DIAGNOSIS — Q211 Atrial septal defect: Secondary | ICD-10-CM | POA: Diagnosis not present

## 2016-05-15 DIAGNOSIS — I499 Cardiac arrhythmia, unspecified: Secondary | ICD-10-CM | POA: Diagnosis not present

## 2016-05-16 DIAGNOSIS — Z5181 Encounter for therapeutic drug level monitoring: Secondary | ICD-10-CM | POA: Diagnosis not present

## 2016-05-16 DIAGNOSIS — G620 Drug-induced polyneuropathy: Secondary | ICD-10-CM | POA: Diagnosis not present

## 2016-05-16 DIAGNOSIS — R112 Nausea with vomiting, unspecified: Secondary | ICD-10-CM | POA: Diagnosis not present

## 2016-05-16 DIAGNOSIS — D72821 Monocytosis (symptomatic): Secondary | ICD-10-CM | POA: Diagnosis not present

## 2016-05-16 DIAGNOSIS — Z1509 Genetic susceptibility to other malignant neoplasm: Secondary | ICD-10-CM | POA: Diagnosis not present

## 2016-05-16 DIAGNOSIS — D696 Thrombocytopenia, unspecified: Secondary | ICD-10-CM | POA: Diagnosis not present

## 2016-05-16 DIAGNOSIS — C189 Malignant neoplasm of colon, unspecified: Secondary | ICD-10-CM | POA: Diagnosis not present

## 2016-05-16 DIAGNOSIS — C179 Malignant neoplasm of small intestine, unspecified: Secondary | ICD-10-CM | POA: Diagnosis not present

## 2016-05-16 DIAGNOSIS — T451X5A Adverse effect of antineoplastic and immunosuppressive drugs, initial encounter: Secondary | ICD-10-CM | POA: Diagnosis not present

## 2016-05-16 DIAGNOSIS — Z79899 Other long term (current) drug therapy: Secondary | ICD-10-CM | POA: Diagnosis not present

## 2016-05-16 DIAGNOSIS — Z5111 Encounter for antineoplastic chemotherapy: Secondary | ICD-10-CM | POA: Diagnosis not present

## 2016-05-16 DIAGNOSIS — K5900 Constipation, unspecified: Secondary | ICD-10-CM | POA: Diagnosis not present

## 2016-05-18 DIAGNOSIS — Z1509 Genetic susceptibility to other malignant neoplasm: Secondary | ICD-10-CM | POA: Diagnosis not present

## 2016-05-18 DIAGNOSIS — C179 Malignant neoplasm of small intestine, unspecified: Secondary | ICD-10-CM | POA: Diagnosis not present

## 2016-05-18 DIAGNOSIS — C189 Malignant neoplasm of colon, unspecified: Secondary | ICD-10-CM | POA: Diagnosis not present

## 2016-05-18 DIAGNOSIS — Z79899 Other long term (current) drug therapy: Secondary | ICD-10-CM | POA: Diagnosis not present

## 2016-05-18 DIAGNOSIS — D72821 Monocytosis (symptomatic): Secondary | ICD-10-CM | POA: Diagnosis not present

## 2016-05-18 DIAGNOSIS — Z5111 Encounter for antineoplastic chemotherapy: Secondary | ICD-10-CM | POA: Diagnosis not present

## 2016-05-23 DIAGNOSIS — Z1509 Genetic susceptibility to other malignant neoplasm: Secondary | ICD-10-CM | POA: Diagnosis not present

## 2016-05-23 DIAGNOSIS — Z79899 Other long term (current) drug therapy: Secondary | ICD-10-CM | POA: Diagnosis not present

## 2016-05-23 DIAGNOSIS — C179 Malignant neoplasm of small intestine, unspecified: Secondary | ICD-10-CM | POA: Diagnosis not present

## 2016-05-23 DIAGNOSIS — C189 Malignant neoplasm of colon, unspecified: Secondary | ICD-10-CM | POA: Diagnosis not present

## 2016-05-23 DIAGNOSIS — D72821 Monocytosis (symptomatic): Secondary | ICD-10-CM | POA: Diagnosis not present

## 2016-05-23 DIAGNOSIS — Z5111 Encounter for antineoplastic chemotherapy: Secondary | ICD-10-CM | POA: Diagnosis not present

## 2016-05-30 DIAGNOSIS — Z5111 Encounter for antineoplastic chemotherapy: Secondary | ICD-10-CM | POA: Diagnosis not present

## 2016-05-30 DIAGNOSIS — D72820 Lymphocytosis (symptomatic): Secondary | ICD-10-CM | POA: Diagnosis not present

## 2016-05-30 DIAGNOSIS — Q211 Atrial septal defect: Secondary | ICD-10-CM | POA: Diagnosis not present

## 2016-05-30 DIAGNOSIS — G894 Chronic pain syndrome: Secondary | ICD-10-CM | POA: Diagnosis not present

## 2016-05-30 DIAGNOSIS — C189 Malignant neoplasm of colon, unspecified: Secondary | ICD-10-CM | POA: Diagnosis not present

## 2016-05-30 DIAGNOSIS — D72821 Monocytosis (symptomatic): Secondary | ICD-10-CM | POA: Diagnosis not present

## 2016-05-30 DIAGNOSIS — R000 Tachycardia, unspecified: Secondary | ICD-10-CM | POA: Diagnosis not present

## 2016-05-30 DIAGNOSIS — D72829 Elevated white blood cell count, unspecified: Secondary | ICD-10-CM | POA: Diagnosis not present

## 2016-05-30 DIAGNOSIS — G620 Drug-induced polyneuropathy: Secondary | ICD-10-CM | POA: Diagnosis not present

## 2016-05-30 DIAGNOSIS — D696 Thrombocytopenia, unspecified: Secondary | ICD-10-CM | POA: Diagnosis not present

## 2016-05-30 DIAGNOSIS — Z1509 Genetic susceptibility to other malignant neoplasm: Secondary | ICD-10-CM | POA: Diagnosis not present

## 2016-05-30 DIAGNOSIS — Z79899 Other long term (current) drug therapy: Secondary | ICD-10-CM | POA: Diagnosis not present

## 2016-05-30 DIAGNOSIS — I471 Supraventricular tachycardia: Secondary | ICD-10-CM | POA: Diagnosis not present

## 2016-05-30 DIAGNOSIS — C179 Malignant neoplasm of small intestine, unspecified: Secondary | ICD-10-CM | POA: Diagnosis not present

## 2016-05-30 DIAGNOSIS — M5442 Lumbago with sciatica, left side: Secondary | ICD-10-CM | POA: Diagnosis not present

## 2016-06-01 DIAGNOSIS — D701 Agranulocytosis secondary to cancer chemotherapy: Secondary | ICD-10-CM | POA: Diagnosis not present

## 2016-06-01 DIAGNOSIS — T451X5A Adverse effect of antineoplastic and immunosuppressive drugs, initial encounter: Secondary | ICD-10-CM | POA: Diagnosis not present

## 2016-06-01 DIAGNOSIS — C189 Malignant neoplasm of colon, unspecified: Secondary | ICD-10-CM | POA: Diagnosis not present

## 2016-06-01 DIAGNOSIS — D696 Thrombocytopenia, unspecified: Secondary | ICD-10-CM | POA: Diagnosis not present

## 2016-06-01 DIAGNOSIS — Z5111 Encounter for antineoplastic chemotherapy: Secondary | ICD-10-CM | POA: Diagnosis not present

## 2016-06-04 DIAGNOSIS — M5442 Lumbago with sciatica, left side: Secondary | ICD-10-CM | POA: Diagnosis not present

## 2016-06-04 DIAGNOSIS — M25551 Pain in right hip: Secondary | ICD-10-CM | POA: Diagnosis not present

## 2016-06-04 DIAGNOSIS — M5417 Radiculopathy, lumbosacral region: Secondary | ICD-10-CM | POA: Diagnosis not present

## 2016-06-04 DIAGNOSIS — G894 Chronic pain syndrome: Secondary | ICD-10-CM | POA: Diagnosis not present

## 2016-06-06 DIAGNOSIS — C189 Malignant neoplasm of colon, unspecified: Secondary | ICD-10-CM | POA: Diagnosis not present

## 2016-06-06 DIAGNOSIS — Z5111 Encounter for antineoplastic chemotherapy: Secondary | ICD-10-CM | POA: Diagnosis not present

## 2016-06-06 DIAGNOSIS — D701 Agranulocytosis secondary to cancer chemotherapy: Secondary | ICD-10-CM | POA: Diagnosis not present

## 2016-06-06 DIAGNOSIS — F4310 Post-traumatic stress disorder, unspecified: Secondary | ICD-10-CM | POA: Diagnosis not present

## 2016-06-06 DIAGNOSIS — T451X5A Adverse effect of antineoplastic and immunosuppressive drugs, initial encounter: Secondary | ICD-10-CM | POA: Diagnosis not present

## 2016-06-06 DIAGNOSIS — F331 Major depressive disorder, recurrent, moderate: Secondary | ICD-10-CM | POA: Diagnosis not present

## 2016-06-06 DIAGNOSIS — D696 Thrombocytopenia, unspecified: Secondary | ICD-10-CM | POA: Diagnosis not present

## 2016-06-13 DIAGNOSIS — D701 Agranulocytosis secondary to cancer chemotherapy: Secondary | ICD-10-CM | POA: Diagnosis not present

## 2016-06-13 DIAGNOSIS — D696 Thrombocytopenia, unspecified: Secondary | ICD-10-CM | POA: Diagnosis not present

## 2016-06-13 DIAGNOSIS — G620 Drug-induced polyneuropathy: Secondary | ICD-10-CM | POA: Diagnosis not present

## 2016-06-13 DIAGNOSIS — T451X5A Adverse effect of antineoplastic and immunosuppressive drugs, initial encounter: Secondary | ICD-10-CM | POA: Diagnosis not present

## 2016-06-13 DIAGNOSIS — C179 Malignant neoplasm of small intestine, unspecified: Secondary | ICD-10-CM | POA: Diagnosis not present

## 2016-06-13 DIAGNOSIS — C189 Malignant neoplasm of colon, unspecified: Secondary | ICD-10-CM | POA: Diagnosis not present

## 2016-06-13 DIAGNOSIS — Z1509 Genetic susceptibility to other malignant neoplasm: Secondary | ICD-10-CM | POA: Diagnosis not present

## 2016-06-13 DIAGNOSIS — Z5111 Encounter for antineoplastic chemotherapy: Secondary | ICD-10-CM | POA: Diagnosis not present

## 2016-06-15 DIAGNOSIS — Z5111 Encounter for antineoplastic chemotherapy: Secondary | ICD-10-CM | POA: Diagnosis not present

## 2016-06-15 DIAGNOSIS — C189 Malignant neoplasm of colon, unspecified: Secondary | ICD-10-CM | POA: Diagnosis not present

## 2016-06-15 DIAGNOSIS — T451X5A Adverse effect of antineoplastic and immunosuppressive drugs, initial encounter: Secondary | ICD-10-CM | POA: Diagnosis not present

## 2016-06-15 DIAGNOSIS — D696 Thrombocytopenia, unspecified: Secondary | ICD-10-CM | POA: Diagnosis not present

## 2016-06-15 DIAGNOSIS — D701 Agranulocytosis secondary to cancer chemotherapy: Secondary | ICD-10-CM | POA: Diagnosis not present

## 2016-06-20 DIAGNOSIS — D696 Thrombocytopenia, unspecified: Secondary | ICD-10-CM | POA: Diagnosis not present

## 2016-06-20 DIAGNOSIS — D701 Agranulocytosis secondary to cancer chemotherapy: Secondary | ICD-10-CM | POA: Diagnosis not present

## 2016-06-20 DIAGNOSIS — T451X5A Adverse effect of antineoplastic and immunosuppressive drugs, initial encounter: Secondary | ICD-10-CM | POA: Diagnosis not present

## 2016-06-20 DIAGNOSIS — C189 Malignant neoplasm of colon, unspecified: Secondary | ICD-10-CM | POA: Diagnosis not present

## 2016-06-20 DIAGNOSIS — Z5111 Encounter for antineoplastic chemotherapy: Secondary | ICD-10-CM | POA: Diagnosis not present

## 2016-06-27 DIAGNOSIS — T80810A Extravasation of vesicant antineoplastic chemotherapy, initial encounter: Secondary | ICD-10-CM | POA: Diagnosis not present

## 2016-06-27 DIAGNOSIS — Z5111 Encounter for antineoplastic chemotherapy: Secondary | ICD-10-CM | POA: Diagnosis not present

## 2016-06-27 DIAGNOSIS — K08109 Complete loss of teeth, unspecified cause, unspecified class: Secondary | ICD-10-CM | POA: Diagnosis not present

## 2016-06-27 DIAGNOSIS — D696 Thrombocytopenia, unspecified: Secondary | ICD-10-CM | POA: Diagnosis not present

## 2016-06-27 DIAGNOSIS — Z1509 Genetic susceptibility to other malignant neoplasm: Secondary | ICD-10-CM | POA: Diagnosis not present

## 2016-06-27 DIAGNOSIS — D649 Anemia, unspecified: Secondary | ICD-10-CM | POA: Diagnosis not present

## 2016-06-27 DIAGNOSIS — T451X5A Adverse effect of antineoplastic and immunosuppressive drugs, initial encounter: Secondary | ICD-10-CM | POA: Diagnosis not present

## 2016-06-27 DIAGNOSIS — C179 Malignant neoplasm of small intestine, unspecified: Secondary | ICD-10-CM | POA: Diagnosis not present

## 2016-06-27 DIAGNOSIS — D701 Agranulocytosis secondary to cancer chemotherapy: Secondary | ICD-10-CM | POA: Diagnosis not present

## 2016-06-27 DIAGNOSIS — K1230 Oral mucositis (ulcerative), unspecified: Secondary | ICD-10-CM | POA: Diagnosis not present

## 2016-06-27 DIAGNOSIS — C189 Malignant neoplasm of colon, unspecified: Secondary | ICD-10-CM | POA: Diagnosis not present

## 2016-06-29 DIAGNOSIS — K08109 Complete loss of teeth, unspecified cause, unspecified class: Secondary | ICD-10-CM | POA: Diagnosis not present

## 2016-06-29 DIAGNOSIS — T80810A Extravasation of vesicant antineoplastic chemotherapy, initial encounter: Secondary | ICD-10-CM | POA: Diagnosis not present

## 2016-06-29 DIAGNOSIS — D696 Thrombocytopenia, unspecified: Secondary | ICD-10-CM | POA: Diagnosis not present

## 2016-06-29 DIAGNOSIS — D649 Anemia, unspecified: Secondary | ICD-10-CM | POA: Diagnosis not present

## 2016-06-29 DIAGNOSIS — T451X5A Adverse effect of antineoplastic and immunosuppressive drugs, initial encounter: Secondary | ICD-10-CM | POA: Diagnosis not present

## 2016-06-29 DIAGNOSIS — D701 Agranulocytosis secondary to cancer chemotherapy: Secondary | ICD-10-CM | POA: Diagnosis not present

## 2016-06-29 DIAGNOSIS — Z1509 Genetic susceptibility to other malignant neoplasm: Secondary | ICD-10-CM | POA: Diagnosis not present

## 2016-06-29 DIAGNOSIS — C189 Malignant neoplasm of colon, unspecified: Secondary | ICD-10-CM | POA: Diagnosis not present

## 2016-06-29 DIAGNOSIS — C179 Malignant neoplasm of small intestine, unspecified: Secondary | ICD-10-CM | POA: Diagnosis not present

## 2016-06-29 DIAGNOSIS — K1230 Oral mucositis (ulcerative), unspecified: Secondary | ICD-10-CM | POA: Diagnosis not present

## 2016-06-29 DIAGNOSIS — Z5111 Encounter for antineoplastic chemotherapy: Secondary | ICD-10-CM | POA: Diagnosis not present

## 2016-07-03 DIAGNOSIS — D72821 Monocytosis (symptomatic): Secondary | ICD-10-CM | POA: Diagnosis not present

## 2016-07-03 DIAGNOSIS — G620 Drug-induced polyneuropathy: Secondary | ICD-10-CM | POA: Diagnosis not present

## 2016-07-03 DIAGNOSIS — D72820 Lymphocytosis (symptomatic): Secondary | ICD-10-CM | POA: Diagnosis not present

## 2016-07-03 DIAGNOSIS — T80810D Extravasation of vesicant antineoplastic chemotherapy, subsequent encounter: Secondary | ICD-10-CM | POA: Diagnosis not present

## 2016-07-03 DIAGNOSIS — C179 Malignant neoplasm of small intestine, unspecified: Secondary | ICD-10-CM | POA: Diagnosis not present

## 2016-07-03 DIAGNOSIS — L03313 Cellulitis of chest wall: Secondary | ICD-10-CM | POA: Diagnosis not present

## 2016-07-03 DIAGNOSIS — T451X5A Adverse effect of antineoplastic and immunosuppressive drugs, initial encounter: Secondary | ICD-10-CM | POA: Diagnosis not present

## 2016-07-03 DIAGNOSIS — D72829 Elevated white blood cell count, unspecified: Secondary | ICD-10-CM | POA: Diagnosis not present

## 2016-07-03 DIAGNOSIS — R151 Fecal smearing: Secondary | ICD-10-CM | POA: Diagnosis not present

## 2016-07-03 DIAGNOSIS — C189 Malignant neoplasm of colon, unspecified: Secondary | ICD-10-CM | POA: Diagnosis not present

## 2016-07-03 DIAGNOSIS — Z5111 Encounter for antineoplastic chemotherapy: Secondary | ICD-10-CM | POA: Diagnosis not present

## 2016-07-03 DIAGNOSIS — D696 Thrombocytopenia, unspecified: Secondary | ICD-10-CM | POA: Diagnosis not present

## 2016-07-03 DIAGNOSIS — Z1509 Genetic susceptibility to other malignant neoplasm: Secondary | ICD-10-CM | POA: Diagnosis not present

## 2016-07-03 DIAGNOSIS — Z95828 Presence of other vascular implants and grafts: Secondary | ICD-10-CM | POA: Diagnosis not present

## 2016-07-05 DIAGNOSIS — Z1509 Genetic susceptibility to other malignant neoplasm: Secondary | ICD-10-CM | POA: Diagnosis not present

## 2016-07-05 DIAGNOSIS — C179 Malignant neoplasm of small intestine, unspecified: Secondary | ICD-10-CM | POA: Diagnosis not present

## 2016-07-05 DIAGNOSIS — Z5111 Encounter for antineoplastic chemotherapy: Secondary | ICD-10-CM | POA: Diagnosis not present

## 2016-07-05 DIAGNOSIS — C189 Malignant neoplasm of colon, unspecified: Secondary | ICD-10-CM | POA: Diagnosis not present

## 2016-07-05 DIAGNOSIS — T80810D Extravasation of vesicant antineoplastic chemotherapy, subsequent encounter: Secondary | ICD-10-CM | POA: Diagnosis not present

## 2016-07-05 DIAGNOSIS — D72829 Elevated white blood cell count, unspecified: Secondary | ICD-10-CM | POA: Diagnosis not present

## 2016-07-11 DIAGNOSIS — C179 Malignant neoplasm of small intestine, unspecified: Secondary | ICD-10-CM | POA: Diagnosis not present

## 2016-07-11 DIAGNOSIS — Z1509 Genetic susceptibility to other malignant neoplasm: Secondary | ICD-10-CM | POA: Diagnosis not present

## 2016-07-11 DIAGNOSIS — L539 Erythematous condition, unspecified: Secondary | ICD-10-CM | POA: Diagnosis not present

## 2016-07-11 DIAGNOSIS — G620 Drug-induced polyneuropathy: Secondary | ICD-10-CM | POA: Diagnosis not present

## 2016-07-11 DIAGNOSIS — C189 Malignant neoplasm of colon, unspecified: Secondary | ICD-10-CM | POA: Diagnosis not present

## 2016-07-11 DIAGNOSIS — D72821 Monocytosis (symptomatic): Secondary | ICD-10-CM | POA: Diagnosis not present

## 2016-07-11 DIAGNOSIS — T80810D Extravasation of vesicant antineoplastic chemotherapy, subsequent encounter: Secondary | ICD-10-CM | POA: Diagnosis not present

## 2016-07-11 DIAGNOSIS — R222 Localized swelling, mass and lump, trunk: Secondary | ICD-10-CM | POA: Diagnosis not present

## 2016-07-11 DIAGNOSIS — D72820 Lymphocytosis (symptomatic): Secondary | ICD-10-CM | POA: Diagnosis not present

## 2016-07-11 DIAGNOSIS — Z9221 Personal history of antineoplastic chemotherapy: Secondary | ICD-10-CM | POA: Diagnosis not present

## 2016-07-11 DIAGNOSIS — T451X5A Adverse effect of antineoplastic and immunosuppressive drugs, initial encounter: Secondary | ICD-10-CM | POA: Diagnosis not present

## 2016-07-11 DIAGNOSIS — Z5111 Encounter for antineoplastic chemotherapy: Secondary | ICD-10-CM | POA: Diagnosis not present

## 2016-07-11 DIAGNOSIS — L03313 Cellulitis of chest wall: Secondary | ICD-10-CM | POA: Diagnosis not present

## 2016-07-11 DIAGNOSIS — M549 Dorsalgia, unspecified: Secondary | ICD-10-CM | POA: Diagnosis not present

## 2016-07-11 DIAGNOSIS — D696 Thrombocytopenia, unspecified: Secondary | ICD-10-CM | POA: Diagnosis not present

## 2016-07-11 DIAGNOSIS — D72829 Elevated white blood cell count, unspecified: Secondary | ICD-10-CM | POA: Diagnosis not present

## 2016-07-11 DIAGNOSIS — G8929 Other chronic pain: Secondary | ICD-10-CM | POA: Diagnosis not present

## 2016-07-16 DIAGNOSIS — N611 Abscess of the breast and nipple: Secondary | ICD-10-CM | POA: Diagnosis not present

## 2016-07-17 DIAGNOSIS — Z1509 Genetic susceptibility to other malignant neoplasm: Secondary | ICD-10-CM | POA: Diagnosis not present

## 2016-07-17 DIAGNOSIS — T80212A Local infection due to central venous catheter, initial encounter: Secondary | ICD-10-CM | POA: Diagnosis not present

## 2016-07-17 DIAGNOSIS — D72829 Elevated white blood cell count, unspecified: Secondary | ICD-10-CM | POA: Diagnosis not present

## 2016-07-17 DIAGNOSIS — T80810D Extravasation of vesicant antineoplastic chemotherapy, subsequent encounter: Secondary | ICD-10-CM | POA: Diagnosis not present

## 2016-07-17 DIAGNOSIS — C189 Malignant neoplasm of colon, unspecified: Secondary | ICD-10-CM | POA: Diagnosis not present

## 2016-07-17 DIAGNOSIS — Z5111 Encounter for antineoplastic chemotherapy: Secondary | ICD-10-CM | POA: Diagnosis not present

## 2016-07-17 DIAGNOSIS — C179 Malignant neoplasm of small intestine, unspecified: Secondary | ICD-10-CM | POA: Diagnosis not present

## 2016-07-20 DIAGNOSIS — C179 Malignant neoplasm of small intestine, unspecified: Secondary | ICD-10-CM | POA: Diagnosis not present

## 2016-07-20 DIAGNOSIS — D72829 Elevated white blood cell count, unspecified: Secondary | ICD-10-CM | POA: Diagnosis not present

## 2016-07-20 DIAGNOSIS — Z5111 Encounter for antineoplastic chemotherapy: Secondary | ICD-10-CM | POA: Diagnosis not present

## 2016-07-20 DIAGNOSIS — C189 Malignant neoplasm of colon, unspecified: Secondary | ICD-10-CM | POA: Diagnosis not present

## 2016-07-20 DIAGNOSIS — T80810D Extravasation of vesicant antineoplastic chemotherapy, subsequent encounter: Secondary | ICD-10-CM | POA: Diagnosis not present

## 2016-07-20 DIAGNOSIS — Z1509 Genetic susceptibility to other malignant neoplasm: Secondary | ICD-10-CM | POA: Diagnosis not present

## 2016-07-23 DIAGNOSIS — Z5111 Encounter for antineoplastic chemotherapy: Secondary | ICD-10-CM | POA: Diagnosis not present

## 2016-07-23 DIAGNOSIS — Z1509 Genetic susceptibility to other malignant neoplasm: Secondary | ICD-10-CM | POA: Diagnosis not present

## 2016-07-23 DIAGNOSIS — T80810D Extravasation of vesicant antineoplastic chemotherapy, subsequent encounter: Secondary | ICD-10-CM | POA: Diagnosis not present

## 2016-07-23 DIAGNOSIS — C189 Malignant neoplasm of colon, unspecified: Secondary | ICD-10-CM | POA: Diagnosis not present

## 2016-07-23 DIAGNOSIS — C179 Malignant neoplasm of small intestine, unspecified: Secondary | ICD-10-CM | POA: Diagnosis not present

## 2016-07-23 DIAGNOSIS — D72829 Elevated white blood cell count, unspecified: Secondary | ICD-10-CM | POA: Diagnosis not present

## 2016-07-30 DIAGNOSIS — C179 Malignant neoplasm of small intestine, unspecified: Secondary | ICD-10-CM | POA: Diagnosis not present

## 2016-07-30 DIAGNOSIS — D72829 Elevated white blood cell count, unspecified: Secondary | ICD-10-CM | POA: Diagnosis not present

## 2016-07-30 DIAGNOSIS — C189 Malignant neoplasm of colon, unspecified: Secondary | ICD-10-CM | POA: Diagnosis not present

## 2016-07-30 DIAGNOSIS — Z5111 Encounter for antineoplastic chemotherapy: Secondary | ICD-10-CM | POA: Diagnosis not present

## 2016-07-30 DIAGNOSIS — T80810D Extravasation of vesicant antineoplastic chemotherapy, subsequent encounter: Secondary | ICD-10-CM | POA: Diagnosis not present

## 2016-07-30 DIAGNOSIS — Z1509 Genetic susceptibility to other malignant neoplasm: Secondary | ICD-10-CM | POA: Diagnosis not present

## 2016-07-30 DIAGNOSIS — C260 Malignant neoplasm of intestinal tract, part unspecified: Secondary | ICD-10-CM | POA: Diagnosis not present

## 2016-08-13 DIAGNOSIS — G894 Chronic pain syndrome: Secondary | ICD-10-CM | POA: Diagnosis not present

## 2016-08-13 DIAGNOSIS — Z79899 Other long term (current) drug therapy: Secondary | ICD-10-CM | POA: Diagnosis not present

## 2016-08-13 DIAGNOSIS — M5417 Radiculopathy, lumbosacral region: Secondary | ICD-10-CM | POA: Diagnosis not present

## 2016-08-13 DIAGNOSIS — M25551 Pain in right hip: Secondary | ICD-10-CM | POA: Diagnosis not present

## 2016-08-13 DIAGNOSIS — C189 Malignant neoplasm of colon, unspecified: Secondary | ICD-10-CM | POA: Diagnosis not present

## 2016-08-13 DIAGNOSIS — M5442 Lumbago with sciatica, left side: Secondary | ICD-10-CM | POA: Diagnosis not present

## 2016-08-13 DIAGNOSIS — Z5181 Encounter for therapeutic drug level monitoring: Secondary | ICD-10-CM | POA: Diagnosis not present

## 2016-08-14 DIAGNOSIS — C182 Malignant neoplasm of ascending colon: Secondary | ICD-10-CM | POA: Diagnosis not present

## 2016-08-22 DIAGNOSIS — Z1509 Genetic susceptibility to other malignant neoplasm: Secondary | ICD-10-CM | POA: Diagnosis not present

## 2016-08-22 DIAGNOSIS — D72821 Monocytosis (symptomatic): Secondary | ICD-10-CM | POA: Diagnosis not present

## 2016-08-22 DIAGNOSIS — T80810A Extravasation of vesicant antineoplastic chemotherapy, initial encounter: Secondary | ICD-10-CM | POA: Diagnosis not present

## 2016-08-22 DIAGNOSIS — D649 Anemia, unspecified: Secondary | ICD-10-CM | POA: Diagnosis not present

## 2016-08-22 DIAGNOSIS — Z79899 Other long term (current) drug therapy: Secondary | ICD-10-CM | POA: Diagnosis not present

## 2016-08-22 DIAGNOSIS — Z5181 Encounter for therapeutic drug level monitoring: Secondary | ICD-10-CM | POA: Diagnosis not present

## 2016-08-22 DIAGNOSIS — D7589 Other specified diseases of blood and blood-forming organs: Secondary | ICD-10-CM | POA: Diagnosis not present

## 2016-08-22 DIAGNOSIS — C179 Malignant neoplasm of small intestine, unspecified: Secondary | ICD-10-CM | POA: Diagnosis not present

## 2016-09-04 DIAGNOSIS — R222 Localized swelling, mass and lump, trunk: Secondary | ICD-10-CM | POA: Diagnosis not present

## 2016-09-04 DIAGNOSIS — C182 Malignant neoplasm of ascending colon: Secondary | ICD-10-CM | POA: Diagnosis not present

## 2016-09-07 DIAGNOSIS — R222 Localized swelling, mass and lump, trunk: Secondary | ICD-10-CM | POA: Diagnosis not present

## 2016-09-12 DIAGNOSIS — C179 Malignant neoplasm of small intestine, unspecified: Secondary | ICD-10-CM | POA: Diagnosis not present

## 2016-09-12 DIAGNOSIS — L271 Localized skin eruption due to drugs and medicaments taken internally: Secondary | ICD-10-CM | POA: Diagnosis not present

## 2016-09-12 DIAGNOSIS — T451X5A Adverse effect of antineoplastic and immunosuppressive drugs, initial encounter: Secondary | ICD-10-CM | POA: Diagnosis not present

## 2016-09-12 DIAGNOSIS — Z5181 Encounter for therapeutic drug level monitoring: Secondary | ICD-10-CM | POA: Diagnosis not present

## 2016-09-12 DIAGNOSIS — Z79899 Other long term (current) drug therapy: Secondary | ICD-10-CM | POA: Diagnosis not present

## 2016-09-12 DIAGNOSIS — T148XXA Other injury of unspecified body region, initial encounter: Secondary | ICD-10-CM | POA: Diagnosis not present

## 2016-09-12 DIAGNOSIS — Z1509 Genetic susceptibility to other malignant neoplasm: Secondary | ICD-10-CM | POA: Diagnosis not present

## 2016-09-13 DIAGNOSIS — Z1509 Genetic susceptibility to other malignant neoplasm: Secondary | ICD-10-CM | POA: Diagnosis not present

## 2016-09-13 DIAGNOSIS — C179 Malignant neoplasm of small intestine, unspecified: Secondary | ICD-10-CM | POA: Diagnosis not present

## 2016-09-13 DIAGNOSIS — C182 Malignant neoplasm of ascending colon: Secondary | ICD-10-CM | POA: Diagnosis not present

## 2016-09-13 DIAGNOSIS — R159 Full incontinence of feces: Secondary | ICD-10-CM | POA: Diagnosis not present

## 2016-10-03 DIAGNOSIS — M5417 Radiculopathy, lumbosacral region: Secondary | ICD-10-CM | POA: Diagnosis not present

## 2016-10-03 DIAGNOSIS — M25551 Pain in right hip: Secondary | ICD-10-CM | POA: Diagnosis not present

## 2016-10-03 DIAGNOSIS — C182 Malignant neoplasm of ascending colon: Secondary | ICD-10-CM | POA: Diagnosis not present

## 2016-10-03 DIAGNOSIS — G894 Chronic pain syndrome: Secondary | ICD-10-CM | POA: Diagnosis not present

## 2016-10-03 DIAGNOSIS — M5442 Lumbago with sciatica, left side: Secondary | ICD-10-CM | POA: Diagnosis not present

## 2016-10-04 DIAGNOSIS — Z809 Family history of malignant neoplasm, unspecified: Secondary | ICD-10-CM | POA: Diagnosis not present

## 2016-10-04 DIAGNOSIS — C179 Malignant neoplasm of small intestine, unspecified: Secondary | ICD-10-CM | POA: Diagnosis not present

## 2016-10-04 DIAGNOSIS — T80810A Extravasation of vesicant antineoplastic chemotherapy, initial encounter: Secondary | ICD-10-CM | POA: Diagnosis not present

## 2016-10-04 DIAGNOSIS — Z8049 Family history of malignant neoplasm of other genital organs: Secondary | ICD-10-CM | POA: Diagnosis not present

## 2016-10-04 DIAGNOSIS — Z3041 Encounter for surveillance of contraceptive pills: Secondary | ICD-10-CM | POA: Diagnosis not present

## 2016-10-04 DIAGNOSIS — Z1509 Genetic susceptibility to other malignant neoplasm: Secondary | ICD-10-CM | POA: Diagnosis not present

## 2016-10-04 DIAGNOSIS — T451X5A Adverse effect of antineoplastic and immunosuppressive drugs, initial encounter: Secondary | ICD-10-CM | POA: Diagnosis not present

## 2016-10-04 DIAGNOSIS — Z87891 Personal history of nicotine dependence: Secondary | ICD-10-CM | POA: Diagnosis not present

## 2016-10-04 DIAGNOSIS — R000 Tachycardia, unspecified: Secondary | ICD-10-CM | POA: Diagnosis not present

## 2016-10-04 DIAGNOSIS — Z95828 Presence of other vascular implants and grafts: Secondary | ICD-10-CM | POA: Diagnosis not present

## 2016-10-04 DIAGNOSIS — Z8481 Family history of carrier of genetic disease: Secondary | ICD-10-CM | POA: Diagnosis not present

## 2016-10-04 DIAGNOSIS — G620 Drug-induced polyneuropathy: Secondary | ICD-10-CM | POA: Diagnosis not present

## 2016-10-04 DIAGNOSIS — Z800 Family history of malignant neoplasm of digestive organs: Secondary | ICD-10-CM | POA: Diagnosis not present

## 2016-10-04 DIAGNOSIS — D7589 Other specified diseases of blood and blood-forming organs: Secondary | ICD-10-CM | POA: Diagnosis not present

## 2016-10-04 DIAGNOSIS — Z806 Family history of leukemia: Secondary | ICD-10-CM | POA: Diagnosis not present

## 2016-10-12 DIAGNOSIS — C179 Malignant neoplasm of small intestine, unspecified: Secondary | ICD-10-CM | POA: Diagnosis not present

## 2016-10-12 DIAGNOSIS — R000 Tachycardia, unspecified: Secondary | ICD-10-CM | POA: Diagnosis not present

## 2016-10-12 DIAGNOSIS — R718 Other abnormality of red blood cells: Secondary | ICD-10-CM | POA: Diagnosis not present

## 2016-10-12 DIAGNOSIS — Z95828 Presence of other vascular implants and grafts: Secondary | ICD-10-CM | POA: Diagnosis not present

## 2016-10-12 DIAGNOSIS — Z9049 Acquired absence of other specified parts of digestive tract: Secondary | ICD-10-CM | POA: Diagnosis not present

## 2016-10-12 DIAGNOSIS — T451X5A Adverse effect of antineoplastic and immunosuppressive drugs, initial encounter: Secondary | ICD-10-CM | POA: Diagnosis not present

## 2016-10-12 DIAGNOSIS — T451X5D Adverse effect of antineoplastic and immunosuppressive drugs, subsequent encounter: Secondary | ICD-10-CM | POA: Diagnosis not present

## 2016-10-12 DIAGNOSIS — D72820 Lymphocytosis (symptomatic): Secondary | ICD-10-CM | POA: Diagnosis not present

## 2016-10-12 DIAGNOSIS — D7589 Other specified diseases of blood and blood-forming organs: Secondary | ICD-10-CM | POA: Diagnosis not present

## 2016-10-12 DIAGNOSIS — D72821 Monocytosis (symptomatic): Secondary | ICD-10-CM | POA: Diagnosis not present

## 2016-10-12 DIAGNOSIS — Z8481 Family history of carrier of genetic disease: Secondary | ICD-10-CM | POA: Diagnosis not present

## 2016-10-12 DIAGNOSIS — Z1509 Genetic susceptibility to other malignant neoplasm: Secondary | ICD-10-CM | POA: Diagnosis not present

## 2016-10-12 DIAGNOSIS — Z87891 Personal history of nicotine dependence: Secondary | ICD-10-CM | POA: Diagnosis not present

## 2016-10-12 DIAGNOSIS — G620 Drug-induced polyneuropathy: Secondary | ICD-10-CM | POA: Diagnosis not present

## 2016-10-22 DIAGNOSIS — K469 Unspecified abdominal hernia without obstruction or gangrene: Secondary | ICD-10-CM | POA: Diagnosis not present

## 2016-10-22 DIAGNOSIS — Z9049 Acquired absence of other specified parts of digestive tract: Secondary | ICD-10-CM | POA: Diagnosis not present

## 2016-10-23 DIAGNOSIS — F331 Major depressive disorder, recurrent, moderate: Secondary | ICD-10-CM | POA: Diagnosis not present

## 2016-10-23 DIAGNOSIS — F4310 Post-traumatic stress disorder, unspecified: Secondary | ICD-10-CM | POA: Diagnosis not present

## 2016-11-08 DIAGNOSIS — Z1509 Genetic susceptibility to other malignant neoplasm: Secondary | ICD-10-CM | POA: Diagnosis not present

## 2016-11-08 DIAGNOSIS — R1900 Intra-abdominal and pelvic swelling, mass and lump, unspecified site: Secondary | ICD-10-CM | POA: Diagnosis not present

## 2016-11-09 DIAGNOSIS — T80810S Extravasation of vesicant antineoplastic chemotherapy, sequela: Secondary | ICD-10-CM | POA: Diagnosis not present

## 2016-11-09 DIAGNOSIS — T451X5S Adverse effect of antineoplastic and immunosuppressive drugs, sequela: Secondary | ICD-10-CM | POA: Diagnosis not present

## 2016-11-09 DIAGNOSIS — Z9221 Personal history of antineoplastic chemotherapy: Secondary | ICD-10-CM | POA: Diagnosis not present

## 2016-11-09 DIAGNOSIS — Z85038 Personal history of other malignant neoplasm of large intestine: Secondary | ICD-10-CM | POA: Diagnosis not present

## 2016-11-09 DIAGNOSIS — C189 Malignant neoplasm of colon, unspecified: Secondary | ICD-10-CM | POA: Diagnosis not present

## 2016-11-09 DIAGNOSIS — Z08 Encounter for follow-up examination after completed treatment for malignant neoplasm: Secondary | ICD-10-CM | POA: Diagnosis not present

## 2016-11-09 DIAGNOSIS — D7589 Other specified diseases of blood and blood-forming organs: Secondary | ICD-10-CM | POA: Diagnosis not present

## 2016-11-09 DIAGNOSIS — G620 Drug-induced polyneuropathy: Secondary | ICD-10-CM | POA: Diagnosis not present

## 2016-11-09 DIAGNOSIS — T451X5A Adverse effect of antineoplastic and immunosuppressive drugs, initial encounter: Secondary | ICD-10-CM | POA: Diagnosis not present

## 2016-11-09 DIAGNOSIS — C179 Malignant neoplasm of small intestine, unspecified: Secondary | ICD-10-CM | POA: Diagnosis not present

## 2016-11-09 DIAGNOSIS — D72821 Monocytosis (symptomatic): Secondary | ICD-10-CM | POA: Diagnosis not present

## 2016-11-09 DIAGNOSIS — Q211 Atrial septal defect: Secondary | ICD-10-CM | POA: Diagnosis not present

## 2016-11-09 DIAGNOSIS — Z1509 Genetic susceptibility to other malignant neoplasm: Secondary | ICD-10-CM | POA: Diagnosis not present

## 2016-11-09 DIAGNOSIS — D72820 Lymphocytosis (symptomatic): Secondary | ICD-10-CM | POA: Diagnosis not present

## 2016-11-09 DIAGNOSIS — G894 Chronic pain syndrome: Secondary | ICD-10-CM | POA: Diagnosis not present

## 2016-11-13 ENCOUNTER — Encounter: Payer: Self-pay | Admitting: Family Medicine

## 2016-11-19 DIAGNOSIS — G894 Chronic pain syndrome: Secondary | ICD-10-CM | POA: Diagnosis not present

## 2016-11-19 DIAGNOSIS — M5442 Lumbago with sciatica, left side: Secondary | ICD-10-CM | POA: Diagnosis not present

## 2016-11-19 DIAGNOSIS — C182 Malignant neoplasm of ascending colon: Secondary | ICD-10-CM | POA: Diagnosis not present

## 2016-11-19 DIAGNOSIS — Z79899 Other long term (current) drug therapy: Secondary | ICD-10-CM | POA: Diagnosis not present

## 2016-11-19 DIAGNOSIS — Z5181 Encounter for therapeutic drug level monitoring: Secondary | ICD-10-CM | POA: Diagnosis not present

## 2016-11-19 DIAGNOSIS — M25551 Pain in right hip: Secondary | ICD-10-CM | POA: Diagnosis not present

## 2016-11-19 DIAGNOSIS — M5417 Radiculopathy, lumbosacral region: Secondary | ICD-10-CM | POA: Diagnosis not present

## 2016-12-03 DIAGNOSIS — K625 Hemorrhage of anus and rectum: Secondary | ICD-10-CM | POA: Diagnosis not present

## 2016-12-03 DIAGNOSIS — Z1509 Genetic susceptibility to other malignant neoplasm: Secondary | ICD-10-CM | POA: Diagnosis not present

## 2016-12-03 DIAGNOSIS — K5903 Drug induced constipation: Secondary | ICD-10-CM | POA: Diagnosis not present

## 2016-12-03 DIAGNOSIS — Z86010 Personal history of colonic polyps: Secondary | ICD-10-CM | POA: Diagnosis not present

## 2016-12-14 DIAGNOSIS — K5900 Constipation, unspecified: Secondary | ICD-10-CM | POA: Diagnosis not present

## 2016-12-14 DIAGNOSIS — R935 Abnormal findings on diagnostic imaging of other abdominal regions, including retroperitoneum: Secondary | ICD-10-CM | POA: Diagnosis not present

## 2016-12-14 DIAGNOSIS — C179 Malignant neoplasm of small intestine, unspecified: Secondary | ICD-10-CM | POA: Diagnosis not present

## 2016-12-14 DIAGNOSIS — C189 Malignant neoplasm of colon, unspecified: Secondary | ICD-10-CM | POA: Diagnosis not present

## 2016-12-17 DIAGNOSIS — M25551 Pain in right hip: Secondary | ICD-10-CM | POA: Diagnosis not present

## 2016-12-17 DIAGNOSIS — C182 Malignant neoplasm of ascending colon: Secondary | ICD-10-CM | POA: Diagnosis not present

## 2016-12-17 DIAGNOSIS — M5417 Radiculopathy, lumbosacral region: Secondary | ICD-10-CM | POA: Diagnosis not present

## 2016-12-17 DIAGNOSIS — G894 Chronic pain syndrome: Secondary | ICD-10-CM | POA: Diagnosis not present

## 2016-12-17 DIAGNOSIS — M5442 Lumbago with sciatica, left side: Secondary | ICD-10-CM | POA: Diagnosis not present

## 2017-01-03 DIAGNOSIS — D72820 Lymphocytosis (symptomatic): Secondary | ICD-10-CM | POA: Diagnosis not present

## 2017-01-03 DIAGNOSIS — Z08 Encounter for follow-up examination after completed treatment for malignant neoplasm: Secondary | ICD-10-CM | POA: Diagnosis not present

## 2017-01-03 DIAGNOSIS — G620 Drug-induced polyneuropathy: Secondary | ICD-10-CM | POA: Diagnosis not present

## 2017-01-03 DIAGNOSIS — Z9049 Acquired absence of other specified parts of digestive tract: Secondary | ICD-10-CM | POA: Diagnosis not present

## 2017-01-03 DIAGNOSIS — C179 Malignant neoplasm of small intestine, unspecified: Secondary | ICD-10-CM | POA: Diagnosis not present

## 2017-01-03 DIAGNOSIS — Z1509 Genetic susceptibility to other malignant neoplasm: Secondary | ICD-10-CM | POA: Diagnosis not present

## 2017-01-03 DIAGNOSIS — G894 Chronic pain syndrome: Secondary | ICD-10-CM | POA: Diagnosis not present

## 2017-01-03 DIAGNOSIS — D72821 Monocytosis (symptomatic): Secondary | ICD-10-CM | POA: Diagnosis not present

## 2017-01-03 DIAGNOSIS — I471 Supraventricular tachycardia: Secondary | ICD-10-CM | POA: Diagnosis not present

## 2017-01-03 DIAGNOSIS — K439 Ventral hernia without obstruction or gangrene: Secondary | ICD-10-CM | POA: Diagnosis not present

## 2017-01-03 DIAGNOSIS — N951 Menopausal and female climacteric states: Secondary | ICD-10-CM | POA: Diagnosis not present

## 2017-01-03 DIAGNOSIS — Z85068 Personal history of other malignant neoplasm of small intestine: Secondary | ICD-10-CM | POA: Diagnosis not present

## 2017-01-03 DIAGNOSIS — D7589 Other specified diseases of blood and blood-forming organs: Secondary | ICD-10-CM | POA: Diagnosis not present

## 2017-01-03 DIAGNOSIS — T451X5S Adverse effect of antineoplastic and immunosuppressive drugs, sequela: Secondary | ICD-10-CM | POA: Diagnosis not present

## 2017-01-03 DIAGNOSIS — R232 Flushing: Secondary | ICD-10-CM | POA: Diagnosis not present

## 2017-01-03 DIAGNOSIS — Z87891 Personal history of nicotine dependence: Secondary | ICD-10-CM | POA: Diagnosis not present

## 2017-01-03 DIAGNOSIS — T80810A Extravasation of vesicant antineoplastic chemotherapy, initial encounter: Secondary | ICD-10-CM | POA: Diagnosis not present

## 2017-01-03 DIAGNOSIS — T451X5A Adverse effect of antineoplastic and immunosuppressive drugs, initial encounter: Secondary | ICD-10-CM | POA: Diagnosis not present

## 2017-01-04 DIAGNOSIS — C179 Malignant neoplasm of small intestine, unspecified: Secondary | ICD-10-CM | POA: Diagnosis not present

## 2017-01-04 DIAGNOSIS — R159 Full incontinence of feces: Secondary | ICD-10-CM | POA: Diagnosis not present

## 2017-01-17 DIAGNOSIS — G620 Drug-induced polyneuropathy: Secondary | ICD-10-CM | POA: Diagnosis not present

## 2017-01-17 DIAGNOSIS — Z1509 Genetic susceptibility to other malignant neoplasm: Secondary | ICD-10-CM | POA: Diagnosis not present

## 2017-01-17 DIAGNOSIS — Z8049 Family history of malignant neoplasm of other genital organs: Secondary | ICD-10-CM | POA: Diagnosis not present

## 2017-01-17 DIAGNOSIS — D7589 Other specified diseases of blood and blood-forming organs: Secondary | ICD-10-CM | POA: Diagnosis not present

## 2017-01-17 DIAGNOSIS — K439 Ventral hernia without obstruction or gangrene: Secondary | ICD-10-CM | POA: Diagnosis not present

## 2017-01-17 DIAGNOSIS — C179 Malignant neoplasm of small intestine, unspecified: Secondary | ICD-10-CM | POA: Diagnosis not present

## 2017-01-17 DIAGNOSIS — F331 Major depressive disorder, recurrent, moderate: Secondary | ICD-10-CM | POA: Diagnosis not present

## 2017-01-17 DIAGNOSIS — D72821 Monocytosis (symptomatic): Secondary | ICD-10-CM | POA: Diagnosis not present

## 2017-01-17 DIAGNOSIS — Z85068 Personal history of other malignant neoplasm of small intestine: Secondary | ICD-10-CM | POA: Diagnosis not present

## 2017-01-17 DIAGNOSIS — D72820 Lymphocytosis (symptomatic): Secondary | ICD-10-CM | POA: Diagnosis not present

## 2017-01-17 DIAGNOSIS — Z800 Family history of malignant neoplasm of digestive organs: Secondary | ICD-10-CM | POA: Diagnosis not present

## 2017-01-17 DIAGNOSIS — F4310 Post-traumatic stress disorder, unspecified: Secondary | ICD-10-CM | POA: Diagnosis not present

## 2017-01-17 DIAGNOSIS — Z87891 Personal history of nicotine dependence: Secondary | ICD-10-CM | POA: Diagnosis not present

## 2017-01-24 DIAGNOSIS — Z1509 Genetic susceptibility to other malignant neoplasm: Secondary | ICD-10-CM | POA: Diagnosis not present

## 2017-01-24 DIAGNOSIS — Z800 Family history of malignant neoplasm of digestive organs: Secondary | ICD-10-CM | POA: Diagnosis not present

## 2017-01-24 DIAGNOSIS — Z86010 Personal history of colonic polyps: Secondary | ICD-10-CM | POA: Diagnosis not present

## 2017-01-24 LAB — HM COLONOSCOPY

## 2017-01-25 ENCOUNTER — Encounter: Payer: Self-pay | Admitting: Family Medicine

## 2017-01-25 DIAGNOSIS — Z1509 Genetic susceptibility to other malignant neoplasm: Secondary | ICD-10-CM | POA: Diagnosis not present

## 2017-02-04 DIAGNOSIS — K439 Ventral hernia without obstruction or gangrene: Secondary | ICD-10-CM | POA: Diagnosis not present

## 2017-02-04 DIAGNOSIS — C182 Malignant neoplasm of ascending colon: Secondary | ICD-10-CM | POA: Diagnosis not present

## 2017-02-05 DIAGNOSIS — C182 Malignant neoplasm of ascending colon: Secondary | ICD-10-CM | POA: Diagnosis not present

## 2017-02-05 DIAGNOSIS — M5442 Lumbago with sciatica, left side: Secondary | ICD-10-CM | POA: Diagnosis not present

## 2017-02-05 DIAGNOSIS — M5417 Radiculopathy, lumbosacral region: Secondary | ICD-10-CM | POA: Diagnosis not present

## 2017-02-05 DIAGNOSIS — G894 Chronic pain syndrome: Secondary | ICD-10-CM | POA: Diagnosis not present

## 2017-02-05 DIAGNOSIS — M25551 Pain in right hip: Secondary | ICD-10-CM | POA: Diagnosis not present

## 2017-02-07 DIAGNOSIS — Z1509 Genetic susceptibility to other malignant neoplasm: Secondary | ICD-10-CM | POA: Diagnosis not present

## 2017-02-07 DIAGNOSIS — C179 Malignant neoplasm of small intestine, unspecified: Secondary | ICD-10-CM | POA: Diagnosis not present

## 2017-02-21 DIAGNOSIS — R1084 Generalized abdominal pain: Secondary | ICD-10-CM | POA: Diagnosis not present

## 2017-02-21 DIAGNOSIS — Z1509 Genetic susceptibility to other malignant neoplasm: Secondary | ICD-10-CM | POA: Diagnosis not present

## 2017-04-02 DIAGNOSIS — C182 Malignant neoplasm of ascending colon: Secondary | ICD-10-CM | POA: Diagnosis not present

## 2017-04-02 DIAGNOSIS — R109 Unspecified abdominal pain: Secondary | ICD-10-CM | POA: Diagnosis not present

## 2017-04-05 DIAGNOSIS — R109 Unspecified abdominal pain: Secondary | ICD-10-CM | POA: Diagnosis not present

## 2017-04-05 DIAGNOSIS — D1803 Hemangioma of intra-abdominal structures: Secondary | ICD-10-CM | POA: Diagnosis not present

## 2017-04-10 DIAGNOSIS — C182 Malignant neoplasm of ascending colon: Secondary | ICD-10-CM | POA: Diagnosis not present

## 2017-04-10 DIAGNOSIS — M5417 Radiculopathy, lumbosacral region: Secondary | ICD-10-CM | POA: Diagnosis not present

## 2017-04-10 DIAGNOSIS — G894 Chronic pain syndrome: Secondary | ICD-10-CM | POA: Diagnosis not present

## 2017-04-10 DIAGNOSIS — M25551 Pain in right hip: Secondary | ICD-10-CM | POA: Diagnosis not present

## 2017-04-10 DIAGNOSIS — M5442 Lumbago with sciatica, left side: Secondary | ICD-10-CM | POA: Diagnosis not present

## 2017-04-15 DIAGNOSIS — Z01812 Encounter for preprocedural laboratory examination: Secondary | ICD-10-CM | POA: Diagnosis not present

## 2017-04-15 DIAGNOSIS — Z1509 Genetic susceptibility to other malignant neoplasm: Secondary | ICD-10-CM | POA: Diagnosis not present

## 2017-04-15 DIAGNOSIS — K432 Incisional hernia without obstruction or gangrene: Secondary | ICD-10-CM | POA: Diagnosis not present

## 2017-04-15 DIAGNOSIS — K5900 Constipation, unspecified: Secondary | ICD-10-CM | POA: Diagnosis not present

## 2017-04-15 DIAGNOSIS — Z87891 Personal history of nicotine dependence: Secondary | ICD-10-CM | POA: Diagnosis not present

## 2017-04-15 DIAGNOSIS — M545 Low back pain: Secondary | ICD-10-CM | POA: Diagnosis not present

## 2017-04-15 DIAGNOSIS — I4891 Unspecified atrial fibrillation: Secondary | ICD-10-CM | POA: Diagnosis not present

## 2017-04-15 DIAGNOSIS — Z85068 Personal history of other malignant neoplasm of small intestine: Secondary | ICD-10-CM | POA: Diagnosis not present

## 2017-04-15 DIAGNOSIS — Z01818 Encounter for other preprocedural examination: Secondary | ICD-10-CM | POA: Diagnosis not present

## 2017-04-15 DIAGNOSIS — G8929 Other chronic pain: Secondary | ICD-10-CM | POA: Diagnosis not present

## 2017-04-25 DIAGNOSIS — Z1509 Genetic susceptibility to other malignant neoplasm: Secondary | ICD-10-CM | POA: Diagnosis not present

## 2017-04-25 DIAGNOSIS — K432 Incisional hernia without obstruction or gangrene: Secondary | ICD-10-CM | POA: Diagnosis not present

## 2017-04-25 DIAGNOSIS — D282 Benign neoplasm of uterine tubes and ligaments: Secondary | ICD-10-CM | POA: Diagnosis not present

## 2017-04-26 DIAGNOSIS — M1990 Unspecified osteoarthritis, unspecified site: Secondary | ICD-10-CM | POA: Diagnosis present

## 2017-04-26 DIAGNOSIS — Z1504 Genetic susceptibility to malignant neoplasm of endometrium: Secondary | ICD-10-CM | POA: Diagnosis not present

## 2017-04-26 DIAGNOSIS — I4891 Unspecified atrial fibrillation: Secondary | ICD-10-CM | POA: Diagnosis present

## 2017-04-26 DIAGNOSIS — K432 Incisional hernia without obstruction or gangrene: Secondary | ICD-10-CM | POA: Diagnosis present

## 2017-04-26 DIAGNOSIS — K219 Gastro-esophageal reflux disease without esophagitis: Secondary | ICD-10-CM | POA: Diagnosis present

## 2017-04-26 DIAGNOSIS — Z882 Allergy status to sulfonamides status: Secondary | ICD-10-CM | POA: Diagnosis not present

## 2017-04-26 DIAGNOSIS — F329 Major depressive disorder, single episode, unspecified: Secondary | ICD-10-CM | POA: Diagnosis present

## 2017-04-26 DIAGNOSIS — Z885 Allergy status to narcotic agent status: Secondary | ICD-10-CM | POA: Diagnosis not present

## 2017-04-26 DIAGNOSIS — Z1502 Genetic susceptibility to malignant neoplasm of ovary: Secondary | ICD-10-CM | POA: Diagnosis not present

## 2017-04-26 DIAGNOSIS — Z87891 Personal history of nicotine dependence: Secondary | ICD-10-CM | POA: Diagnosis not present

## 2017-04-26 DIAGNOSIS — Z888 Allergy status to other drugs, medicaments and biological substances status: Secondary | ICD-10-CM | POA: Diagnosis not present

## 2017-04-26 DIAGNOSIS — Z881 Allergy status to other antibiotic agents status: Secondary | ICD-10-CM | POA: Diagnosis not present

## 2017-04-26 DIAGNOSIS — Z800 Family history of malignant neoplasm of digestive organs: Secondary | ICD-10-CM | POA: Diagnosis not present

## 2017-04-26 DIAGNOSIS — Z1509 Genetic susceptibility to other malignant neoplasm: Secondary | ICD-10-CM | POA: Diagnosis not present

## 2017-04-26 DIAGNOSIS — Z85038 Personal history of other malignant neoplasm of large intestine: Secondary | ICD-10-CM | POA: Diagnosis not present

## 2017-04-26 DIAGNOSIS — I10 Essential (primary) hypertension: Secondary | ICD-10-CM | POA: Diagnosis present

## 2017-04-26 DIAGNOSIS — R569 Unspecified convulsions: Secondary | ICD-10-CM | POA: Diagnosis present

## 2017-04-26 DIAGNOSIS — Z79899 Other long term (current) drug therapy: Secondary | ICD-10-CM | POA: Diagnosis not present

## 2017-05-13 DIAGNOSIS — Z9889 Other specified postprocedural states: Secondary | ICD-10-CM | POA: Diagnosis not present

## 2017-05-16 DIAGNOSIS — D7589 Other specified diseases of blood and blood-forming organs: Secondary | ICD-10-CM | POA: Diagnosis not present

## 2017-05-16 DIAGNOSIS — T451X5A Adverse effect of antineoplastic and immunosuppressive drugs, initial encounter: Secondary | ICD-10-CM | POA: Diagnosis not present

## 2017-05-16 DIAGNOSIS — D72820 Lymphocytosis (symptomatic): Secondary | ICD-10-CM | POA: Diagnosis not present

## 2017-05-16 DIAGNOSIS — T80810A Extravasation of vesicant antineoplastic chemotherapy, initial encounter: Secondary | ICD-10-CM | POA: Diagnosis not present

## 2017-05-16 DIAGNOSIS — G620 Drug-induced polyneuropathy: Secondary | ICD-10-CM | POA: Diagnosis not present

## 2017-05-16 DIAGNOSIS — Z1509 Genetic susceptibility to other malignant neoplasm: Secondary | ICD-10-CM | POA: Diagnosis not present

## 2017-05-16 DIAGNOSIS — Z9889 Other specified postprocedural states: Secondary | ICD-10-CM | POA: Diagnosis not present

## 2017-05-16 DIAGNOSIS — K432 Incisional hernia without obstruction or gangrene: Secondary | ICD-10-CM | POA: Diagnosis not present

## 2017-05-16 DIAGNOSIS — C179 Malignant neoplasm of small intestine, unspecified: Secondary | ICD-10-CM | POA: Diagnosis not present

## 2017-05-16 DIAGNOSIS — D72821 Monocytosis (symptomatic): Secondary | ICD-10-CM | POA: Diagnosis not present

## 2017-05-20 DIAGNOSIS — F4310 Post-traumatic stress disorder, unspecified: Secondary | ICD-10-CM | POA: Diagnosis not present

## 2017-05-20 DIAGNOSIS — F331 Major depressive disorder, recurrent, moderate: Secondary | ICD-10-CM | POA: Diagnosis not present

## 2017-05-21 DIAGNOSIS — Z8719 Personal history of other diseases of the digestive system: Secondary | ICD-10-CM | POA: Diagnosis not present

## 2017-05-21 DIAGNOSIS — Z9889 Other specified postprocedural states: Secondary | ICD-10-CM | POA: Diagnosis not present

## 2017-05-23 DIAGNOSIS — H524 Presbyopia: Secondary | ICD-10-CM | POA: Diagnosis not present

## 2017-05-23 DIAGNOSIS — H1013 Acute atopic conjunctivitis, bilateral: Secondary | ICD-10-CM | POA: Diagnosis not present

## 2017-05-23 DIAGNOSIS — H5213 Myopia, bilateral: Secondary | ICD-10-CM | POA: Diagnosis not present

## 2017-05-23 DIAGNOSIS — H52223 Regular astigmatism, bilateral: Secondary | ICD-10-CM | POA: Diagnosis not present

## 2017-05-28 DIAGNOSIS — Z1509 Genetic susceptibility to other malignant neoplasm: Secondary | ICD-10-CM | POA: Diagnosis not present

## 2017-05-28 DIAGNOSIS — Z9221 Personal history of antineoplastic chemotherapy: Secondary | ICD-10-CM | POA: Diagnosis not present

## 2017-05-28 DIAGNOSIS — D72820 Lymphocytosis (symptomatic): Secondary | ICD-10-CM | POA: Diagnosis not present

## 2017-05-28 DIAGNOSIS — T80810S Extravasation of vesicant antineoplastic chemotherapy, sequela: Secondary | ICD-10-CM | POA: Diagnosis not present

## 2017-05-28 DIAGNOSIS — Z08 Encounter for follow-up examination after completed treatment for malignant neoplasm: Secondary | ICD-10-CM | POA: Diagnosis not present

## 2017-05-28 DIAGNOSIS — D72821 Monocytosis (symptomatic): Secondary | ICD-10-CM | POA: Diagnosis not present

## 2017-05-28 DIAGNOSIS — D7589 Other specified diseases of blood and blood-forming organs: Secondary | ICD-10-CM | POA: Diagnosis not present

## 2017-05-28 DIAGNOSIS — Z85068 Personal history of other malignant neoplasm of small intestine: Secondary | ICD-10-CM | POA: Diagnosis not present

## 2017-05-28 DIAGNOSIS — Z85038 Personal history of other malignant neoplasm of large intestine: Secondary | ICD-10-CM | POA: Diagnosis not present

## 2017-06-04 DIAGNOSIS — Z8719 Personal history of other diseases of the digestive system: Secondary | ICD-10-CM | POA: Diagnosis not present

## 2017-06-04 DIAGNOSIS — Z9889 Other specified postprocedural states: Secondary | ICD-10-CM | POA: Diagnosis not present

## 2017-06-05 DIAGNOSIS — M25551 Pain in right hip: Secondary | ICD-10-CM | POA: Diagnosis not present

## 2017-06-05 DIAGNOSIS — M5442 Lumbago with sciatica, left side: Secondary | ICD-10-CM | POA: Diagnosis not present

## 2017-06-05 DIAGNOSIS — Z5181 Encounter for therapeutic drug level monitoring: Secondary | ICD-10-CM | POA: Diagnosis not present

## 2017-06-05 DIAGNOSIS — M5417 Radiculopathy, lumbosacral region: Secondary | ICD-10-CM | POA: Diagnosis not present

## 2017-06-05 DIAGNOSIS — C182 Malignant neoplasm of ascending colon: Secondary | ICD-10-CM | POA: Diagnosis not present

## 2017-06-05 DIAGNOSIS — G894 Chronic pain syndrome: Secondary | ICD-10-CM | POA: Diagnosis not present

## 2017-06-05 DIAGNOSIS — Z79899 Other long term (current) drug therapy: Secondary | ICD-10-CM | POA: Diagnosis not present

## 2017-06-20 DIAGNOSIS — Z1509 Genetic susceptibility to other malignant neoplasm: Secondary | ICD-10-CM | POA: Diagnosis not present

## 2017-06-20 DIAGNOSIS — R300 Dysuria: Secondary | ICD-10-CM | POA: Diagnosis not present

## 2017-06-20 DIAGNOSIS — Z90710 Acquired absence of both cervix and uterus: Secondary | ICD-10-CM | POA: Diagnosis not present

## 2017-06-20 DIAGNOSIS — Z90722 Acquired absence of ovaries, bilateral: Secondary | ICD-10-CM | POA: Diagnosis not present

## 2017-07-02 DIAGNOSIS — G894 Chronic pain syndrome: Secondary | ICD-10-CM | POA: Diagnosis not present

## 2017-07-02 DIAGNOSIS — M25551 Pain in right hip: Secondary | ICD-10-CM | POA: Diagnosis not present

## 2017-07-02 DIAGNOSIS — M5442 Lumbago with sciatica, left side: Secondary | ICD-10-CM | POA: Diagnosis not present

## 2017-07-02 DIAGNOSIS — C182 Malignant neoplasm of ascending colon: Secondary | ICD-10-CM | POA: Diagnosis not present

## 2017-07-02 DIAGNOSIS — M5417 Radiculopathy, lumbosacral region: Secondary | ICD-10-CM | POA: Diagnosis not present

## 2017-08-07 DIAGNOSIS — T80810A Extravasation of vesicant antineoplastic chemotherapy, initial encounter: Secondary | ICD-10-CM | POA: Diagnosis not present

## 2017-08-07 DIAGNOSIS — M5417 Radiculopathy, lumbosacral region: Secondary | ICD-10-CM | POA: Diagnosis not present

## 2017-08-07 DIAGNOSIS — Z1509 Genetic susceptibility to other malignant neoplasm: Secondary | ICD-10-CM | POA: Diagnosis not present

## 2017-08-07 DIAGNOSIS — D7589 Other specified diseases of blood and blood-forming organs: Secondary | ICD-10-CM | POA: Diagnosis not present

## 2017-08-07 DIAGNOSIS — D72820 Lymphocytosis (symptomatic): Secondary | ICD-10-CM | POA: Diagnosis not present

## 2017-08-07 DIAGNOSIS — G894 Chronic pain syndrome: Secondary | ICD-10-CM | POA: Diagnosis not present

## 2017-08-07 DIAGNOSIS — G620 Drug-induced polyneuropathy: Secondary | ICD-10-CM | POA: Diagnosis not present

## 2017-08-07 DIAGNOSIS — C179 Malignant neoplasm of small intestine, unspecified: Secondary | ICD-10-CM | POA: Diagnosis not present

## 2017-08-07 DIAGNOSIS — D72821 Monocytosis (symptomatic): Secondary | ICD-10-CM | POA: Diagnosis not present

## 2017-08-07 DIAGNOSIS — T451X5A Adverse effect of antineoplastic and immunosuppressive drugs, initial encounter: Secondary | ICD-10-CM | POA: Diagnosis not present

## 2017-08-07 DIAGNOSIS — Q211 Atrial septal defect: Secondary | ICD-10-CM | POA: Diagnosis not present

## 2017-08-21 DIAGNOSIS — Z1509 Genetic susceptibility to other malignant neoplasm: Secondary | ICD-10-CM | POA: Diagnosis not present

## 2017-08-21 DIAGNOSIS — C182 Malignant neoplasm of ascending colon: Secondary | ICD-10-CM | POA: Diagnosis not present

## 2017-08-21 DIAGNOSIS — K5903 Drug induced constipation: Secondary | ICD-10-CM | POA: Diagnosis not present

## 2017-08-22 ENCOUNTER — Other Ambulatory Visit: Payer: Self-pay

## 2017-08-22 ENCOUNTER — Emergency Department (INDEPENDENT_AMBULATORY_CARE_PROVIDER_SITE_OTHER)
Admission: EM | Admit: 2017-08-22 | Discharge: 2017-08-22 | Disposition: A | Discharge status: Home / Self Care | Payer: Medicare Other | Source: Home / Self Care | Attending: Family Medicine | Admitting: Family Medicine

## 2017-08-22 DIAGNOSIS — B354 Tinea corporis: Secondary | ICD-10-CM

## 2017-08-22 DIAGNOSIS — R197 Diarrhea, unspecified: Secondary | ICD-10-CM

## 2017-08-22 DIAGNOSIS — R112 Nausea with vomiting, unspecified: Secondary | ICD-10-CM

## 2017-08-22 MED ORDER — KETOCONAZOLE 2 % EX CREA: TOPICAL_CREAM | CUTANEOUS | 0 refills | Status: DC

## 2017-08-22 MED ORDER — ONDANSETRON 4 MG PO TBDP: ORAL_TABLET | ORAL | 0 refills | Status: AC

## 2017-08-22 NOTE — Discharge Instructions (Addendum)
May apply 1% hydrocortisone cream 2 or 3 times daily for itching and redness.  Begin clear liquids (Pedialyte while having diarrhea) until improved, then advance to a [employer] (Bananas, Rice, Applesauce, Toast).  Then gradually resume a regular diet when tolerated.  Avoid milk products until well.    If symptoms become significantly worse during the night or over the weekend, proceed to the local emergency room.

## 2017-08-22 NOTE — ED Provider Notes (Signed)
[facility]    CSN: [id number] Arrival date & time: 08/22/17  1525     History   Chief Complaint Chief Complaint  Patient presents with  . Tinea    RT side of neck    HPI Mary [name] is a 52 y.o. female.   Patient reports that she went swimming last week and afterwards noticed a small rash on her right neck.  The lesion itches and has increased in size; she believes that she has ringworm.  No lesions elsewhere. During the past 3 to 4 days she has not felt well with onset of nausea/vomiting and diarrhea.  She denies abdominal pain or fever, and has been slowly improving.  The history is provided by the patient.  Emesis  Severity:  Mild Duration:  4 days Timing:  Intermittent Quality:  Stomach contents Able to tolerate:  Liquids Progression:  Improving Chronicity:  New Recent urination:  Normal Relieved by:  None tried Worsened by:  Nothing Ineffective treatments:  None tried Associated symptoms: diarrhea   Associated symptoms: no abdominal pain, no arthralgias, no chills, no cough, no fever, no headaches, no myalgias, no sore throat and no URI   Diarrhea:    Quality:  Watery   Severity:  Mild   Duration:  3 days   Timing:  Sporadic   Progression:  Partially resolved   Past Medical History:  Diagnosis Date  . Anxiety   . Complication of anesthesia    "hard to put to sleep", woke up during procedure in the past  . Diverticulosis of colon (without mention of hemorrhage)   . Herniated disc   . Mental disorder    PTSD  . MVP (mitral valve prolapse)   . PAF (paroxysmal atrial fibrillation) ([place])   . Personal history of colonic polyps 06/26/2007   tubular adenoma  . Shortness of breath    Due to Dilaudid and Morphine  . Small bowel cancer ([place]) 01/23/2016  . SVT (supraventricular tachycardia) ([facility])    hx of ablation    Patient Active Problem List   Diagnosis Date Noted  . Sinus tachycardia 03/30/2013  . PFO (patent foramen ovale) 07/25/2011   . Lynch syndrome 03/06/2011  . DUB (dysfunctional uterine bleeding) 03/06/2011  . Adenocarcinoma of small bowel ([place]) 02/19/2011  . Metrorrhagia 02/14/2011  . Personal history of colonic polyps 02/13/2011  . Constipation 02/13/2011  . GERD (gastroesophageal reflux disease) 02/13/2011  . Rhinitis due to pollen 04/20/2010  . PULMONARY NODULE 03/13/2010  . NIGHT SWEATS 02/07/2010  . AMENORRHEA, SECONDARY 12/06/2009  . EDEMA 09/16/2009  . Obstructive chronic bronchitis without exacerbation ([place]) 07/01/2008  . Myalgia and myositis, unspecified 05/25/2008  . MENORRHAGIA, PERIMENOPAUSAL 05/06/2008  . DIVERTICULOSIS OF COLON 02/20/2007  . OVARIAN CYST 02/06/2007  . Chronic constipation 11/19/2005  . DEPRESSION, MAJOR, RECURRENT 10/09/2005  . ANXIETY 10/09/2005  . TOBACCO DEPENDENCE 10/09/2005  . MITRAL VALVE DISORDER 10/09/2005  . ATRIAL FIBRILLATION 10/09/2005  . BACK PAIN W/RADIATION, UNSPECIFIED 10/09/2005    Past Surgical History:  Procedure Laterality Date  . BACK SURGERY     several  . CARDIAC ELECTROPHYSIOLOGY MAPPING AND ABLATION    . CARDIAC ELECTROPHYSIOLOGY MAPPING AND ABLATION  12/2011  . COLON SURGERY  01/23/2016  . COLONOSCOPY    . ENDOMETRIAL ABLATION  04/19/11  . TONSILLECTOMY    . URETHRA SURGERY      OB History    Gravida  1   Para      Term  Preterm      AB  1   Living        SAB      TAB  1   Ectopic      Multiple      Live Births               Home Medications    Prior to Admission medications   Medication Sig Start Date End Date Taking? Authorizing Provider  clonazePAM (KLONOPIN) 1 MG tablet Take 1 to 2 tabs at night 03/13/16   [provider]  ketoconazole (NIZORAL) 2 % cream Apply once daily for up to two weeks 08/22/17   Kandra Nicolas, MD  lansoprazole (PREVACID) 30 MG capsule Take by mouth. 03/20/12   [provider]  Levomilnacipran HCl ER 80 MG CP24 Take by mouth.    [provider]    loperamide (IMODIUM) 2 MG capsule Take by mouth. 03/13/16   [provider]  methylphenidate (RITALIN) 10 MG tablet Take 30 mg by mouth 2 (two) times daily.    [provider]  metoprolol succinate (TOPROL-XL) 25 MG 24 hr tablet Take 25 mg by mouth daily.    [provider]  midodrine (PROAMATINE) 2.5 MG tablet Take 2.5 mg by mouth daily.    [provider]  Multiple Vitamin (MULTIVITAMIN) tablet Take by mouth.    [provider]  ondansetron (ZOFRAN ODT) 4 MG disintegrating tablet Take one tab by mouth Q6hr prn nausea.  Dissolve under tongue. 08/22/17   Kandra Nicolas, MD  ondansetron (ZOFRAN) 8 MG tablet Take by mouth. 03/13/16   [provider]  topiramate (TOPAMAX) 50 MG tablet TK 1 T PO TID WITH FOOD 07/19/15   [provider]    Family History Family History  Problem Relation Age of Onset  . Coronary artery disease Father   . Hypertension Father   . Colon cancer Father   . Neuropathy Mother   . Stomach cancer Paternal Aunt     Social History Social History   Tobacco Use  . Smoking status: Current Every Day Smoker    Packs/day: 0.80    Years: 28.00    Pack years: 22.40    Types: Cigarettes  . Smokeless tobacco: Never Used  . Tobacco comment: started smoking at age 81. Counseling sheet given in exam room to quit smoking 02-13-2011; 03/15/16- quit smoking cigs, only vaping  Substance Use Topics  . Alcohol use: Yes    Comment: rarely  . Drug use: No     Allergies   Albuterol; Atenolol; Benzoyl peroxide; Codeine; Cyclobenzaprine hcl; Diphenhydramine hcl; Erythromycin; Fentanyl; Remeron [mirtazapine]; and Sulfonamide derivatives   Review of Systems Review of Systems  Constitutional: Negative for chills and fever.  HENT: Negative for sore throat.   Respiratory: Negative for cough.   Gastrointestinal: Positive for diarrhea and vomiting. Negative for abdominal pain.  Musculoskeletal: Negative for arthralgias and  myalgias.  Skin: Positive for rash.  Neurological: Negative for headaches.  All other systems reviewed and are negative.    Physical Exam Triage Vital Signs ED Triage Vitals [08/22/17 1557]  Enc Vitals Group     BP (!) 136/92     Pulse Rate 80     Resp      Temp 98 F (36.7 C)     Temp Source Oral     SpO2 100 %     Weight 136 lb (61.7 kg)     Height 5\' 4"  (1.626 m)  Head Circumference      Peak Flow      Pain Score 0     Pain Loc      Pain Edu?      Excl. in [place]?    No data found.  Updated Vital Signs BP (!) 136/92 (BP Location: Right Arm)   Pulse 80   Temp 98 F (36.7 C) (Oral)   Ht 5\' 4"  (1.626 m)   Wt 61.7 kg   SpO2 100%   BMI 23.34 kg/m   Visual Acuity Right Eye Distance:   Left Eye Distance:   Bilateral Distance:    Right Eye Near:   Left Eye Near:    Bilateral Near:     Physical Exam  Constitutional: She appears well-developed and well-nourished. No distress.  HENT:  Head: Normocephalic.  Right Ear: External ear normal.  Left Ear: External ear normal.  Nose: Nose normal.  Mouth/Throat: Oropharynx is clear and moist.  Eyes: Pupils are equal, round, and reactive to light. Conjunctivae are normal.  Neck: Neck supple.    Right lateral neck has a 2.5 cm diameter annular erythematous eruption with slightly raised edges and central clearing.  No tenderness to palpation.  Cardiovascular: Normal heart sounds.  Pulmonary/Chest: Breath sounds normal.  Abdominal: Bowel sounds are normal. She exhibits no distension. There is no tenderness.  Musculoskeletal: She exhibits no edema.  Lymphadenopathy:    She has no cervical adenopathy.  Neurological: She is alert.  Skin: Skin is warm and dry.  Nursing note and vitals reviewed.    UC Treatments / Results  Labs (all labs ordered are listed, but only abnormal results are displayed) Labs Reviewed - No data to display  EKG None  Radiology No results found.  Procedures Procedures (including  critical care time)  Medications Ordered in UC Medications - No data to display  Initial Impression / Assessment and Plan / UC Course  I have reviewed the triage vital signs and the nursing notes.  Pertinent labs & imaging results that were available during my care of the patient were reviewed by me and considered in my medical decision making (see chart for details).    Begin Nizoral cream once daily.  Followup with Family Doctor if not improved in about 2 weeks.  Suspect viral gastroenteritis. Followup with Family Doctor if not improved in about 4 days   Final Clinical Impressions(s) / UC Diagnoses   Final diagnoses:  Tinea corporis  Nausea vomiting and diarrhea     Discharge Instructions     May apply 1% hydrocortisone cream 2 or 3 times daily for itching and redness.  Begin clear liquids (Pedialyte while having diarrhea) until improved, then advance to a [employer] (Bananas, Rice, Applesauce, Toast).  Then gradually resume a regular diet when tolerated.  Avoid milk products until well.    If symptoms become significantly worse during the night or over the weekend, proceed to the local emergency room.     ED Prescriptions    Medication Sig Dispense Auth. Provider   ondansetron (ZOFRAN ODT) 4 MG disintegrating tablet Take one tab by mouth Q6hr prn nausea.  Dissolve under tongue. 12 tablet Kandra Nicolas, MD   ketoconazole (NIZORAL) 2 % cream Apply once daily for up to two weeks 30 g Kandra Nicolas, MD        Kandra Nicolas, MD 08/23/17 [phone]

## 2017-08-22 NOTE — ED Triage Notes (Signed)
Pt c/o ringworm like rash on RT side of neck. Says she went swimming last week and noticed it shortly after.  Itches intermittently. Put neosporin on it last night.

## 2017-08-26 DIAGNOSIS — F331 Major depressive disorder, recurrent, moderate: Secondary | ICD-10-CM | POA: Diagnosis not present

## 2017-08-26 DIAGNOSIS — F4310 Post-traumatic stress disorder, unspecified: Secondary | ICD-10-CM | POA: Diagnosis not present

## 2017-08-27 DIAGNOSIS — F17210 Nicotine dependence, cigarettes, uncomplicated: Secondary | ICD-10-CM | POA: Diagnosis not present

## 2017-08-27 DIAGNOSIS — L905 Scar conditions and fibrosis of skin: Secondary | ICD-10-CM | POA: Diagnosis not present

## 2017-08-27 DIAGNOSIS — Z85068 Personal history of other malignant neoplasm of small intestine: Secondary | ICD-10-CM | POA: Diagnosis not present

## 2017-08-27 DIAGNOSIS — Z1509 Genetic susceptibility to other malignant neoplasm: Secondary | ICD-10-CM | POA: Diagnosis not present

## 2017-08-27 DIAGNOSIS — N641 Fat necrosis of breast: Secondary | ICD-10-CM | POA: Diagnosis not present

## 2017-08-29 DIAGNOSIS — C182 Malignant neoplasm of ascending colon: Secondary | ICD-10-CM | POA: Diagnosis not present

## 2017-08-29 DIAGNOSIS — M5417 Radiculopathy, lumbosacral region: Secondary | ICD-10-CM | POA: Diagnosis not present

## 2017-08-29 DIAGNOSIS — M25551 Pain in right hip: Secondary | ICD-10-CM | POA: Diagnosis not present

## 2017-08-29 DIAGNOSIS — G894 Chronic pain syndrome: Secondary | ICD-10-CM | POA: Diagnosis not present

## 2017-08-29 DIAGNOSIS — M5442 Lumbago with sciatica, left side: Secondary | ICD-10-CM | POA: Diagnosis not present

## 2017-09-11 ENCOUNTER — Other Ambulatory Visit: Payer: Self-pay | Admitting: Family Medicine

## 2017-09-11 DIAGNOSIS — Z1239 Encounter for other screening for malignant neoplasm of breast: Secondary | ICD-10-CM

## 2017-09-12 ENCOUNTER — Ambulatory Visit (INDEPENDENT_AMBULATORY_CARE_PROVIDER_SITE_OTHER): Payer: Medicare Other

## 2017-09-12 DIAGNOSIS — Z1239 Encounter for other screening for malignant neoplasm of breast: Secondary | ICD-10-CM

## 2017-09-12 DIAGNOSIS — Z1231 Encounter for screening mammogram for malignant neoplasm of breast: Secondary | ICD-10-CM | POA: Diagnosis not present

## 2017-09-17 DIAGNOSIS — K5900 Constipation, unspecified: Secondary | ICD-10-CM | POA: Diagnosis not present

## 2017-09-17 DIAGNOSIS — Z1509 Genetic susceptibility to other malignant neoplasm: Secondary | ICD-10-CM | POA: Diagnosis not present

## 2017-09-17 DIAGNOSIS — C19 Malignant neoplasm of rectosigmoid junction: Secondary | ICD-10-CM | POA: Diagnosis not present

## 2017-09-17 DIAGNOSIS — K429 Umbilical hernia without obstruction or gangrene: Secondary | ICD-10-CM | POA: Diagnosis not present

## 2017-09-17 DIAGNOSIS — D1803 Hemangioma of intra-abdominal structures: Secondary | ICD-10-CM | POA: Diagnosis not present

## 2017-09-17 DIAGNOSIS — R911 Solitary pulmonary nodule: Secondary | ICD-10-CM | POA: Diagnosis not present

## 2017-09-17 DIAGNOSIS — C179 Malignant neoplasm of small intestine, unspecified: Secondary | ICD-10-CM | POA: Diagnosis not present

## 2017-09-26 DIAGNOSIS — Z5181 Encounter for therapeutic drug level monitoring: Secondary | ICD-10-CM | POA: Diagnosis not present

## 2017-09-26 DIAGNOSIS — G894 Chronic pain syndrome: Secondary | ICD-10-CM | POA: Diagnosis not present

## 2017-09-26 DIAGNOSIS — M5442 Lumbago with sciatica, left side: Secondary | ICD-10-CM | POA: Diagnosis not present

## 2017-09-26 DIAGNOSIS — C182 Malignant neoplasm of ascending colon: Secondary | ICD-10-CM | POA: Diagnosis not present

## 2017-09-26 DIAGNOSIS — M5417 Radiculopathy, lumbosacral region: Secondary | ICD-10-CM | POA: Diagnosis not present

## 2017-09-26 DIAGNOSIS — M25551 Pain in right hip: Secondary | ICD-10-CM | POA: Diagnosis not present

## 2017-09-26 DIAGNOSIS — Z79899 Other long term (current) drug therapy: Secondary | ICD-10-CM | POA: Diagnosis not present

## 2017-10-17 DIAGNOSIS — B373 Candidiasis of vulva and vagina: Secondary | ICD-10-CM | POA: Diagnosis not present

## 2017-10-24 DIAGNOSIS — M5417 Radiculopathy, lumbosacral region: Secondary | ICD-10-CM | POA: Diagnosis not present

## 2017-10-24 DIAGNOSIS — G894 Chronic pain syndrome: Secondary | ICD-10-CM | POA: Diagnosis not present

## 2017-10-24 DIAGNOSIS — M5442 Lumbago with sciatica, left side: Secondary | ICD-10-CM | POA: Diagnosis not present

## 2017-10-24 DIAGNOSIS — M25551 Pain in right hip: Secondary | ICD-10-CM | POA: Diagnosis not present

## 2017-10-24 DIAGNOSIS — C182 Malignant neoplasm of ascending colon: Secondary | ICD-10-CM | POA: Diagnosis not present

## 2017-11-07 DIAGNOSIS — G894 Chronic pain syndrome: Secondary | ICD-10-CM | POA: Diagnosis not present

## 2017-11-07 DIAGNOSIS — C179 Malignant neoplasm of small intestine, unspecified: Secondary | ICD-10-CM | POA: Diagnosis not present

## 2017-11-07 DIAGNOSIS — D72820 Lymphocytosis (symptomatic): Secondary | ICD-10-CM | POA: Diagnosis not present

## 2017-11-07 DIAGNOSIS — Z1509 Genetic susceptibility to other malignant neoplasm: Secondary | ICD-10-CM | POA: Diagnosis not present

## 2017-11-07 DIAGNOSIS — T451X5A Adverse effect of antineoplastic and immunosuppressive drugs, initial encounter: Secondary | ICD-10-CM | POA: Diagnosis not present

## 2017-11-07 DIAGNOSIS — Q211 Atrial septal defect: Secondary | ICD-10-CM | POA: Diagnosis not present

## 2017-11-07 DIAGNOSIS — D7589 Other specified diseases of blood and blood-forming organs: Secondary | ICD-10-CM | POA: Diagnosis not present

## 2017-11-07 DIAGNOSIS — Z08 Encounter for follow-up examination after completed treatment for malignant neoplasm: Secondary | ICD-10-CM | POA: Diagnosis not present

## 2017-11-07 DIAGNOSIS — D72821 Monocytosis (symptomatic): Secondary | ICD-10-CM | POA: Diagnosis not present

## 2017-11-07 DIAGNOSIS — T80810A Extravasation of vesicant antineoplastic chemotherapy, initial encounter: Secondary | ICD-10-CM | POA: Diagnosis not present

## 2017-11-07 DIAGNOSIS — Z90722 Acquired absence of ovaries, bilateral: Secondary | ICD-10-CM | POA: Diagnosis not present

## 2017-11-07 DIAGNOSIS — Z85068 Personal history of other malignant neoplasm of small intestine: Secondary | ICD-10-CM | POA: Diagnosis not present

## 2017-11-07 DIAGNOSIS — M898X9 Other specified disorders of bone, unspecified site: Secondary | ICD-10-CM | POA: Diagnosis not present

## 2017-11-07 DIAGNOSIS — G620 Drug-induced polyneuropathy: Secondary | ICD-10-CM | POA: Diagnosis not present

## 2017-11-07 DIAGNOSIS — Z90710 Acquired absence of both cervix and uterus: Secondary | ICD-10-CM | POA: Diagnosis not present

## 2017-11-19 DIAGNOSIS — C19 Malignant neoplasm of rectosigmoid junction: Secondary | ICD-10-CM | POA: Diagnosis not present

## 2017-11-19 DIAGNOSIS — Z23 Encounter for immunization: Secondary | ICD-10-CM | POA: Diagnosis not present

## 2017-11-19 DIAGNOSIS — C179 Malignant neoplasm of small intestine, unspecified: Secondary | ICD-10-CM | POA: Diagnosis not present

## 2017-11-19 DIAGNOSIS — M898X9 Other specified disorders of bone, unspecified site: Secondary | ICD-10-CM | POA: Diagnosis not present

## 2017-11-19 DIAGNOSIS — R1084 Generalized abdominal pain: Secondary | ICD-10-CM | POA: Diagnosis not present

## 2017-11-21 DIAGNOSIS — F331 Major depressive disorder, recurrent, moderate: Secondary | ICD-10-CM | POA: Diagnosis not present

## 2017-11-21 DIAGNOSIS — F4310 Post-traumatic stress disorder, unspecified: Secondary | ICD-10-CM | POA: Diagnosis not present

## 2017-11-25 DIAGNOSIS — C182 Malignant neoplasm of ascending colon: Secondary | ICD-10-CM | POA: Diagnosis not present

## 2017-11-25 DIAGNOSIS — R0789 Other chest pain: Secondary | ICD-10-CM | POA: Diagnosis not present

## 2017-11-25 DIAGNOSIS — R002 Palpitations: Secondary | ICD-10-CM | POA: Diagnosis not present

## 2017-11-25 DIAGNOSIS — Q211 Atrial septal defect: Secondary | ICD-10-CM | POA: Diagnosis not present

## 2017-12-10 DIAGNOSIS — R002 Palpitations: Secondary | ICD-10-CM | POA: Diagnosis not present

## 2017-12-10 DIAGNOSIS — R0789 Other chest pain: Secondary | ICD-10-CM | POA: Diagnosis not present

## 2017-12-10 DIAGNOSIS — Q211 Atrial septal defect: Secondary | ICD-10-CM | POA: Diagnosis not present

## 2017-12-17 DIAGNOSIS — C182 Malignant neoplasm of ascending colon: Secondary | ICD-10-CM | POA: Diagnosis not present

## 2017-12-17 DIAGNOSIS — Z1509 Genetic susceptibility to other malignant neoplasm: Secondary | ICD-10-CM | POA: Diagnosis not present

## 2017-12-17 DIAGNOSIS — K5903 Drug induced constipation: Secondary | ICD-10-CM | POA: Diagnosis not present

## 2017-12-19 DIAGNOSIS — Z79899 Other long term (current) drug therapy: Secondary | ICD-10-CM | POA: Diagnosis not present

## 2017-12-19 DIAGNOSIS — M5442 Lumbago with sciatica, left side: Secondary | ICD-10-CM | POA: Diagnosis not present

## 2017-12-19 DIAGNOSIS — C182 Malignant neoplasm of ascending colon: Secondary | ICD-10-CM | POA: Diagnosis not present

## 2017-12-19 DIAGNOSIS — M5417 Radiculopathy, lumbosacral region: Secondary | ICD-10-CM | POA: Diagnosis not present

## 2017-12-19 DIAGNOSIS — G894 Chronic pain syndrome: Secondary | ICD-10-CM | POA: Diagnosis not present

## 2017-12-19 DIAGNOSIS — Z5181 Encounter for therapeutic drug level monitoring: Secondary | ICD-10-CM | POA: Diagnosis not present

## 2017-12-19 DIAGNOSIS — M25551 Pain in right hip: Secondary | ICD-10-CM | POA: Diagnosis not present

## 2017-12-26 DIAGNOSIS — I491 Atrial premature depolarization: Secondary | ICD-10-CM | POA: Diagnosis not present

## 2017-12-26 DIAGNOSIS — I471 Supraventricular tachycardia: Secondary | ICD-10-CM | POA: Diagnosis not present

## 2018-01-08 LAB — CBC AND DIFFERENTIAL: WBC: 4.1

## 2018-02-25 ENCOUNTER — Ambulatory Visit: Payer: Medicare Other

## 2018-03-06 NOTE — Progress Notes (Signed)
Subjective:   Mary [name] is a 53 y.o. female who presents for Medicare Annual (Subsequent) preventive examination.  Review of Systems:  No ROS.  Medicare Wellness Visit. Additional risk factors are reflected in the social history.  Cardiac Risk Factors include: sedentary lifestyle;smoking/ tobacco exposure;Other (see comment) Sleep patterns:Getting on average of 5 hours of sleep at night. Wakes up 2 times to void during the night. Wakes up and feels "groggy" still.  Home Safety/Smoke Alarms: Feels safe in home. Smoke alarms in place.  Living environment; Lives with fiancee in 1 story of home. Stairs going into the house and handrails are in place.Shower is a walk in shower and grab bar is in place. Seat Belt Safety/Bike Helmet: Wears seat belt.   Female:   Pap- done by Dr. Polly Cobia 10/17/2017      Mammo-  utd     Dexa scan-  Not eligible due to age      82- utd     Objective:     Vitals: BP 124/64 (BP Location: Left Arm, Patient Position: Sitting, Cuff Size: Normal)   Pulse 78   Ht 5\' 5"  (1.651 m)   Wt 138 lb (62.6 kg)   SpO2 90%   BMI 22.96 kg/m   Body mass index is 22.96 kg/m.  Advanced Directives 03/10/2018 03/15/2016 04/11/2011  Does Patient Have a Medical Advance Directive? Yes Yes Patient does not have advance directive  Type of Advance Directive Living will;Healthcare Power of [place];Living will -  Does patient want to make changes to medical advance directive? No - Patient declined - -  Copy of [place] in Chart? No - copy requested No - copy requested -    Tobacco Social History   Tobacco Use  Smoking Status Former Smoker  . Packs/day: 0.80  . Years: 28.00  . Pack years: 22.40  . Types: Cigarettes  . Last attempt to quit: 2017  . Years since quitting: 3.1  Smokeless Tobacco Never Used  Tobacco Comment   started smoking at age 83. Counseling sheet given in exam room to quit smoking 02-13-2011; 03/15/16-  quit smoking cigs, only vaping     Counseling given: Not Answered Comment: started smoking at age 18. Counseling sheet given in exam room to quit smoking 02-13-2011; 03/15/16- quit smoking cigs, only vaping   Clinical Intake:  Pre-visit preparation completed: Yes  Pain : 0-10 Pain Score: 6  Pain Type: Chronic pain Pain Location: Back Pain Orientation: Lower Pain Descriptors / Indicators: Constant, Sharp, Throbbing, Radiating Pain Onset: More than a month ago Pain Frequency: Constant Pain Relieving Factors: medications and getting pressure off of back Effect of Pain on Daily Activities: effects her daily life sometimes stays in bed due to pain  Pain Relieving Factors: medications and getting pressure off of back  Nutritional Risks: None Diabetes: No  How often do you need to have someone help you when you read instructions, pamphlets, or other written materials from your doctor or pharmacy?: 1 - Never What is the last grade level you completed in school?: 16  Interpreter Needed?: No  Information entered by :: Orlie Dakin, LPN  Past Medical History:  Diagnosis Date  . Anxiety   . Complication of anesthesia    "hard to put to sleep", woke up during procedure in the past  . Diverticulosis of colon (without mention of hemorrhage)   . Herniated disc   . Lynch syndrome   . Mental disorder  PTSD  . MVP (mitral valve prolapse)   . PAF (paroxysmal atrial fibrillation) ([place])   . Personal history of colonic polyps 06/26/2007   tubular adenoma  . Shortness of breath    Due to Dilaudid and Morphine  . Small bowel cancer ([place]) 01/23/2016  . SVT (supraventricular tachycardia) ([facility])    hx of ablation   Past Surgical History:  Procedure Laterality Date  . ABDOMINAL HERNIA REPAIR    . ABDOMINAL HYSTERECTOMY    . BACK SURGERY     several  . CARDIAC ELECTROPHYSIOLOGY MAPPING AND ABLATION    . CARDIAC ELECTROPHYSIOLOGY MAPPING AND ABLATION  12/2011  . COLON SURGERY  01/23/2016   . COLONOSCOPY    . ENDOMETRIAL ABLATION  04/19/11  . TONSILLECTOMY    . URETHRA SURGERY     Family History  Problem Relation Age of Onset  . Coronary artery disease Father   . Hypertension Father   . Colon cancer Father   . Neuropathy Mother   . Stomach cancer Paternal Aunt    Social History   Socioeconomic History  . Marital status: Soil scientist    Spouse name: Marden Noble  . Number of children: 0  . Years of education: 62  . Highest education level: Associate degree: academic program  Occupational History  . Occupation: diability    Comment: retired  Scientific laboratory technician  . Financial resource strain: Not hard at all  . Food insecurity:    Worry: Never true    Inability: Never true  . Transportation needs:    Medical: No    Non-medical: No  Tobacco Use  . Smoking status: Former Smoker    Packs/day: 0.80    Years: 28.00    Pack years: 22.40    Types: Cigarettes    Last attempt to quit: 2017    Years since quitting: 3.1  . Smokeless tobacco: Never Used  . Tobacco comment: started smoking at age 45. Counseling sheet given in exam room to quit smoking 02-13-2011; 03/15/16- quit smoking cigs, only vaping  Substance and Sexual Activity  . Alcohol use: Yes    Comment: rarely  . Drug use: No  . Sexual activity: Yes  Lifestyle  . Physical activity:    Days per week: 0 days    Minutes per session: 0 min  . Stress: Only a little  Relationships  . Social connections:    Talks on phone: More than three times a week    Gets together: Once a week    Attends religious service: Never    Active member of club or organization: No    Attends meetings of clubs or organizations: Never    Relationship status: Widowed  Other Topics Concern  . Not on file  Social History Narrative   Pt stays in a lot of pain so does not do much daily. There are times she stays in bed all day due to the pain.  Is engaged to get married    Outpatient Encounter Medications as of 03/10/2018  Medication Sig   . Buprenorphine HCl (BELBUCA) 900 MCG FILM Place 900 mg inside cheek. Apply one patch twice a day  . buPROPion (WELLBUTRIN SR) 150 MG 12 hr tablet Take 150 mg by mouth 2 (two) times daily.  . clonazePAM (KLONOPIN) 1 MG tablet Take 1 to 2 tabs at night  . lansoprazole (PREVACID) 30 MG capsule Take by mouth.  . linaclotide (LINZESS) 290 MCG CAPS capsule Take 290 mcg by mouth daily before breakfast.  .  methylphenidate (RITALIN) 10 MG tablet Take 30 mg by mouth 2 (two) times daily.  . metoprolol succinate (TOPROL-XL) 25 MG 24 hr tablet Take 25 mg by mouth daily.  . midodrine (PROAMATINE) 2.5 MG tablet Take 2.5 mg by mouth daily.  . Multiple Vitamin (MULTIVITAMIN) tablet Take by mouth.  . ondansetron (ZOFRAN ODT) 4 MG disintegrating tablet Take one tab by mouth Q6hr prn nausea.  Dissolve under tongue.  . ondansetron (ZOFRAN) 8 MG tablet Take by mouth.  . topiramate (TOPAMAX) 50 MG tablet TK 1 T PO TID WITH FOOD  . loperamide (IMODIUM) 2 MG capsule Take by mouth.  . [DISCONTINUED] ketoconazole (NIZORAL) 2 % cream Apply once daily for up to two weeks  . [DISCONTINUED] Levomilnacipran HCl ER 80 MG CP24 Take by mouth.   No facility-administered encounter medications on file as of 03/10/2018.     Activities of Daily Living In your present state of health, do you have any difficulty performing the following activities: 03/10/2018  Hearing? N  Vision? N  Difficulty concentrating or making decisions? N  Walking or climbing stairs? N  Dressing or bathing? N  Doing errands, shopping? N  Preparing Food and eating ? N  Using the Toilet? N  In the past six months, have you accidently leaked urine? N  Do you have problems with loss of bowel control? Y  Comment leakage of bowels  Managing your Medications? N  Managing your Finances? N  Housekeeping or managing your Housekeeping? N  Some recent data might be hidden    Patient Care Team: Hali Marry, MD as PCP - General (Family  Medicine) Elsie Stain, MD (Pulmonary Disease) Wendie Chess, MD (Cardiology)    Assessment:   This is a routine wellness examination for Mary [name].Physical assessment deferred to PCP.   Exercise Activities and Dietary recommendations Current Exercise Habits: The patient does not participate in regular exercise at present, Exercise limited by: orthopedic condition(s) Diet Eats a healthy diet of vegetables and meats. Breakfast: blueberry muffins, toaster struddle Lunch: sandwich Dinner: meat and vegetables      Goals    . Patient Stated     Would like to be able to get of the pain pills and get some back relief.       Fall Risk Fall Risk  03/10/2018 03/15/2016  Falls in the past year? 0 No  Risk for fall due to : Medication side effect;Mental status change;Impaired balance/gait -  Follow up Falls prevention discussed -   Is the patient's home free of loose throw rugs in walkways, pet beds, electrical cords, etc?   yes      Grab bars in the bathroom? yes      Handrails on the stairs?   yes      Adequate lighting?   yes   Depression Screen PHQ 2/9 Scores 03/10/2018 03/15/2016  PHQ - 2 Score 2 6  PHQ- 9 Score 9 22     Cognitive Function MMSE - Mini Mental State Exam 03/15/2016  Not completed: Unable to complete     6CIT Screen 03/10/2018  What Year? 0 points  What month? 0 points  What time? 0 points  Count back from 20 0 points  Repeat phrase 0 points    Immunization History  Administered Date(s) Administered  . Influenza Split 11/09/2010, 12/04/2011  . Influenza Whole 11/14/2006, 11/11/2007, 11/15/2008  . Influenza,inj,Quad PF,6+ Mos 12/01/2013  . Pneumococcal Polysaccharide-23 04/05/2011  . Td 05/06/2008    Screening Tests Health Maintenance  Topic Date Due  . PAP SMEAR-Modifier  01/23/2014  . INFLUENZA VACCINE  08/01/2017  . TETANUS/TDAP  05/07/2018  . MAMMOGRAM  09/13/2019  . COLONOSCOPY  01/25/2020  . HIV Screening  Completed       Plan:       Mary [name] , Thank you for taking time to come for your Medicare Wellness Visit. I appreciate your ongoing commitment to your health goals. Please review the following plan we discussed and let me know if I can assist you in the future.   Please schedule your next medicare wellness visit with me in 1 yr. Bring a copy of your living will and/or healthcare power of attorney to your next office visit.   These are the goals we discussed: Goals    . Patient Stated     Would like to be able to get of the pain pills and get some back relief.       This is a list of the screening recommended for you and due dates:  Health Maintenance  Topic Date Due  . Pap Smear  01/23/2014  . Flu Shot  08/01/2017  . Tetanus Vaccine  05/07/2018  . Mammogram  09/13/2019  . Colon Cancer Screening  01/25/2020  . HIV Screening  Completed     I have personally reviewed and noted the following in the patient's chart:   . Medical and social history . Use of alcohol, tobacco or illicit drugs  . Current medications and supplements . Functional ability and status . Nutritional status . Physical activity . Advanced directives . List of other physicians . Hospitalizations, surgeries, and ER visits in previous 12 months . Vitals . Screenings to include cognitive, depression, and falls . Referrals and appointments  In addition, I have reviewed and discussed with patient certain preventive protocols, quality metrics, and best practice recommendations. A written personalized care plan for preventive services as well as general preventive health recommendations were provided to patient.     Joanne Chars, LPN  [id number]

## 2018-03-10 ENCOUNTER — Encounter: Payer: Self-pay | Admitting: Family Medicine

## 2018-03-10 ENCOUNTER — Ambulatory Visit (INDEPENDENT_AMBULATORY_CARE_PROVIDER_SITE_OTHER): Payer: Medicare Other | Admitting: *Deleted

## 2018-03-10 VITALS — BP 124/64 | HR 78 | Ht 65.0 in | Wt 138.0 lb

## 2018-03-10 DIAGNOSIS — Z0000 Encounter for general adult medical examination without abnormal findings: Secondary | ICD-10-CM | POA: Diagnosis not present

## 2018-03-10 NOTE — Patient Instructions (Addendum)
Mary [name] , Thank you for taking time to come for your Medicare Wellness Visit. I appreciate your ongoing commitment to your health goals. Please review the following plan we discussed and let me know if I can assist you in the future.   Please schedule your next medicare wellness visit with me in 1 yr. Bring a copy of your living will and/or healthcare power of attorney to your next office visit. These are the goals we discussed: Goals    . Patient Stated     Would like to be able to get of the pain pills and get some back relief.    Health Maintenance, Female Adopting a healthy lifestyle and getting preventive care can go a long way to promote health and wellness. Talk with your health care provider about what schedule of regular examinations is right for you. This is a good chance for you to check in with your provider about disease prevention and staying healthy. In between checkups, there are plenty of things you can do on your own. Experts have done a lot of research about which lifestyle changes and preventive measures are most likely to keep you healthy. Ask your health care provider for more information. Weight and diet Eat a healthy diet  Be sure to include plenty of vegetables, fruits, low-fat dairy products, and lean protein.  Do not eat a lot of foods high in solid fats, added sugars, or salt.  Get regular exercise. This is one of the most important things you can do for your health. ? Most adults should exercise for at least 150 minutes each week. The exercise should increase your heart rate and make you sweat (moderate-intensity exercise). ? Most adults should also do strengthening exercises at least twice a week. This is in addition to the moderate-intensity exercise. Maintain a healthy weight  Body mass index (BMI) is a measurement that can be used to identify possible weight problems. It estimates body fat based on height and weight. Your health care provider can help  determine your BMI and help you achieve or maintain a healthy weight.  For females 69 years of age and older: ? A BMI below 18.5 is considered underweight. ? A BMI of 18.5 to 24.9 is normal. ? A BMI of 25 to 29.9 is considered overweight. ? A BMI of 30 and above is considered obese. Watch levels of cholesterol and blood lipids  You should start having your blood tested for lipids and cholesterol at 52 years of age, then have this test every 5 years.  You may need to have your cholesterol levels checked more often if: ? Your lipid or cholesterol levels are high. ? You are older than 52 years of age. ? You are at high risk for heart disease. Cancer screening Lung Cancer  Lung cancer screening is recommended for adults 53-33 years old who are at high risk for lung cancer because of a history of smoking.  A yearly low-dose CT scan of the lungs is recommended for people who: ? Currently smoke. ? Have quit within the past 15 years. ? Have at least a 30-pack-year history of smoking. A pack year is smoking an average of one pack of cigarettes a day for 1 year.  Yearly screening should continue until it has been 15 years since you quit.  Yearly screening should stop if you develop a health problem that would prevent you from having lung cancer treatment. Breast Cancer  Practice breast self-awareness. This means understanding how  your breasts normally appear and feel.  It also means doing regular breast self-exams. Let your health care provider know about any changes, no matter how small.  If you are in your 20s or 30s, you should have a clinical breast exam (CBE) by a health care provider every 1-3 years as part of a regular health exam.  If you are 26 or older, have a CBE every year. Also consider having a breast X-ray (mammogram) every year.  If you have a family history of breast cancer, talk to your health care provider about genetic screening.  If you are at high risk for breast  cancer, talk to your health care provider about having an MRI and a mammogram every year.  Breast cancer gene (BRCA) assessment is recommended for women who have family members with BRCA-related cancers. BRCA-related cancers include: ? Breast. ? Ovarian. ? Tubal. ? Peritoneal cancers.  Results of the assessment will determine the need for genetic counseling and BRCA1 and BRCA2 testing. Cervical Cancer Your health care provider may recommend that you be screened regularly for cancer of the pelvic organs (ovaries, uterus, and vagina). This screening involves a pelvic examination, including checking for microscopic changes to the surface of your cervix (Pap test). You may be encouraged to have this screening done every 3 years, beginning at age 43.  For women ages 59-65, health care providers may recommend pelvic exams and Pap testing every 3 years, or they may recommend the Pap and pelvic exam, combined with testing for human papilloma virus (HPV), every 5 years. Some types of HPV increase your risk of cervical cancer. Testing for HPV may also be done on women of any age with unclear Pap test results.  Other health care providers may not recommend any screening for nonpregnant women who are considered low risk for pelvic cancer and who do not have symptoms. Ask your health care provider if a screening pelvic exam is right for you.  If you have had past treatment for cervical cancer or a condition that could lead to cancer, you need Pap tests and screening for cancer for at least 20 years after your treatment. If Pap tests have been discontinued, your risk factors (such as having a new sexual partner) need to be reassessed to determine if screening should resume. Some women have medical problems that increase the chance of getting cervical cancer. In these cases, your health care provider may recommend more frequent screening and Pap tests. Colorectal Cancer  This type of cancer can be detected and  often prevented.  Routine colorectal cancer screening usually begins at 52 years of age and continues through 52 years of age.  Your health care provider may recommend screening at an earlier age if you have risk factors for colon cancer.  Your health care provider may also recommend using home test kits to check for hidden blood in the stool.  A small camera at the end of a tube can be used to examine your colon directly (sigmoidoscopy or colonoscopy). This is done to check for the earliest forms of colorectal cancer.  Routine screening usually begins at age 43.  Direct examination of the colon should be repeated every 5-10 years through 52 years of age. However, you may need to be screened more often if early forms of precancerous polyps or small growths are found. Skin Cancer  Check your skin from head to toe regularly.  Tell your health care provider about any new moles or changes in moles, especially  if there is a change in a mole's shape or color.  Also tell your health care provider if you have a mole that is larger than the size of a pencil eraser.  Always use sunscreen. Apply sunscreen liberally and repeatedly throughout the day.  Protect yourself by wearing long sleeves, pants, a wide-brimmed hat, and sunglasses whenever you are outside. Heart disease, diabetes, and high blood pressure  High blood pressure causes heart disease and increases the risk of stroke. High blood pressure is more likely to develop in: ? People who have blood pressure in the high end of the normal range (130-139/85-89 mm Hg). ? People who are overweight or obese. ? People who are African American.  If you are 22-55 years of age, have your blood pressure checked every 3-5 years. If you are 83 years of age or older, have your blood pressure checked every year. You should have your blood pressure measured twice-once when you are at a hospital or clinic, and once when you are not at a hospital or clinic.  Record the average of the two measurements. To check your blood pressure when you are not at a hospital or clinic, you can use: ? An automated blood pressure machine at a pharmacy. ? A home blood pressure monitor.  If you are between 80 years and 57 years old, ask your health care provider if you should take aspirin to prevent strokes.  Have regular diabetes screenings. This involves taking a blood sample to check your fasting blood sugar level. ? If you are at a normal weight and have a low risk for diabetes, have this test once every three years after 52 years of age. ? If you are overweight and have a high risk for diabetes, consider being tested at a younger age or more often. Preventing infection Hepatitis B  If you have a higher risk for hepatitis B, you should be screened for this virus. You are considered at high risk for hepatitis B if: ? You were born in a country where hepatitis B is common. Ask your health care provider which countries are considered high risk. ? Your parents were born in a high-risk country, and you have not been immunized against hepatitis B (hepatitis B vaccine). ? You have HIV or AIDS. ? You use needles to inject street drugs. ? You live with someone who has hepatitis B. ? You have had sex with someone who has hepatitis B. ? You get hemodialysis treatment. ? You take certain medicines for conditions, including cancer, organ transplantation, and autoimmune conditions. Hepatitis C  Blood testing is recommended for: ? Everyone born from [birth year] through 1964. ? Anyone with known risk factors for hepatitis C. Sexually transmitted infections (STIs)  You should be screened for sexually transmitted infections (STIs) including gonorrhea and chlamydia if: ? You are sexually active and are younger than 52 years of age. ? You are older than 52 years of age and your health care provider tells you that you are at risk for this type of infection. ? Your sexual activity  has changed since you were last screened and you are at an increased risk for chlamydia or gonorrhea. Ask your health care provider if you are at risk.  If you do not have HIV, but are at risk, it may be recommended that you take a prescription medicine daily to prevent HIV infection. This is called pre-exposure prophylaxis (PrEP). You are considered at risk if: ? You are sexually active and do not  regularly use condoms or know the HIV status of your partner(s). ? You take drugs by injection. ? You are sexually active with a partner who has HIV. Talk with your health care provider about whether you are at high risk of being infected with HIV. If you choose to begin PrEP, you should first be tested for HIV. You should then be tested every 3 months for as long as you are taking PrEP. Pregnancy  If you are premenopausal and you may become pregnant, ask your health care provider about preconception counseling.  If you may become pregnant, take 400 to 800 micrograms (mcg) of folic acid every day.  If you want to prevent pregnancy, talk to your health care provider about birth control (contraception). Osteoporosis and menopause  Osteoporosis is a disease in which the bones lose minerals and strength with aging. This can result in serious bone fractures. Your risk for osteoporosis can be identified using a bone density scan.  If you are 84 years of age or older, or if you are at risk for osteoporosis and fractures, ask your health care provider if you should be screened.  Ask your health care provider whether you should take a calcium or vitamin D supplement to lower your risk for osteoporosis.  Menopause may have certain physical symptoms and risks.  Hormone replacement therapy may reduce some of these symptoms and risks. Talk to your health care provider about whether hormone replacement therapy is right for you. Follow these instructions at home:  Schedule regular health, dental, and eye  exams.  Stay current with your immunizations.  Do not use any tobacco products including cigarettes, chewing tobacco, or electronic cigarettes.  If you are pregnant, do not drink alcohol.  If you are breastfeeding, limit how much and how often you drink alcohol.  Limit alcohol intake to no more than 1 drink per day for nonpregnant women. One drink equals 12 ounces of beer, 5 ounces of wine, or 1 ounces of hard liquor.  Do not use street drugs.  Do not share needles.  Ask your health care provider for help if you need support or information about quitting drugs.  Tell your health care provider if you often feel depressed.  Tell your health care provider if you have ever been abused or do not feel safe at home. This information is not intended to replace advice given to you by your health care provider. Make sure you discuss any questions you have with your health care provider. Document Released: 07/03/2010 Document Revised: 05/26/2015 Document Reviewed: 09/21/2014 Elsevier Interactive Patient Education  2019 [employer].

## 2018-03-13 ENCOUNTER — Ambulatory Visit: Payer: Medicare Other | Admitting: Family Medicine

## 2018-07-29 LAB — HM COLONOSCOPY

## 2018-08-01 ENCOUNTER — Encounter: Payer: Self-pay | Admitting: Family Medicine

## 2018-08-28 ENCOUNTER — Telehealth: Payer: Self-pay | Admitting: *Deleted

## 2018-08-28 NOTE — Telephone Encounter (Signed)
Left patient message to call and answer screening questions prior to appointment on 09/01/2018 at 3:00pm.

## 2018-09-01 ENCOUNTER — Ambulatory Visit (INDEPENDENT_AMBULATORY_CARE_PROVIDER_SITE_OTHER): Payer: Medicare Other | Admitting: Obstetrics & Gynecology

## 2018-09-01 ENCOUNTER — Other Ambulatory Visit: Payer: Self-pay

## 2018-09-01 ENCOUNTER — Encounter: Payer: Self-pay | Admitting: Obstetrics & Gynecology

## 2018-09-01 ENCOUNTER — Other Ambulatory Visit (HOSPITAL_COMMUNITY)
Admission: RE | Admit: 2018-09-01 | Discharge: 2018-09-01 | Disposition: A | Discharge status: Home / Self Care | Payer: Medicare Other | Source: Ambulatory Visit | Attending: Obstetrics & Gynecology | Admitting: Obstetrics & Gynecology

## 2018-09-01 VITALS — BP 123/81 | HR 72 | Ht 64.0 in | Wt 132.0 lb

## 2018-09-01 DIAGNOSIS — N9089 Other specified noninflammatory disorders of vulva and perineum: Secondary | ICD-10-CM | POA: Diagnosis not present

## 2018-09-01 DIAGNOSIS — R102 Pelvic and perineal pain: Secondary | ICD-10-CM | POA: Diagnosis not present

## 2018-09-01 DIAGNOSIS — N898 Other specified noninflammatory disorders of vagina: Secondary | ICD-10-CM | POA: Diagnosis present

## 2018-09-01 MED ORDER — TRIAMCINOLONE ACETONIDE 0.5 % EX OINT: TOPICAL_OINTMENT | CUTANEOUS | 0 refills | Status: DC

## 2018-09-01 MED ORDER — ESTRADIOL 0.075 MG/24HR TD PTTW: 1.0000 | MEDICATED_PATCH | TRANSDERMAL | 12 refills | Status: DC

## 2018-09-01 NOTE — Progress Notes (Signed)
   Subjective:    Patient ID: Mary [name], female    DOB: 10-06-1965, 53 y.o.   MRN: [id number]  HPI  53 yo female presents for HRT refill, dyspareunia, vulvar irritation.  Pt feels like she is getting a yeast infection.    Review of Systems  Respiratory: Negative.   Cardiovascular: Negative.   Gastrointestinal: Positive for abdominal pain.  Genitourinary: Positive for dyspareunia, pelvic pain, vaginal discharge and vaginal pain. Negative for vaginal bleeding.  Musculoskeletal: Positive for myalgias.  Psychiatric/Behavioral: Negative.        Objective:   Physical Exam Vitals signs reviewed.  Constitutional:      General: She is not in acute distress.    Appearance: She is well-developed.  HENT:     Head: Normocephalic and atraumatic.  Eyes:     Conjunctiva/sclera: Conjunctivae normal.  Neck:     Musculoskeletal: Neck supple. No muscular tenderness.  Cardiovascular:     Rate and Rhythm: Normal rate and regular rhythm.     Heart sounds: Normal heart sounds.  Pulmonary:     Effort: Pulmonary effort is normal.     Breath sounds: Normal breath sounds.  Abdominal:     General: Abdomen is flat.     Palpations: Abdomen is soft. There is no mass.     Tenderness: There is no abdominal tenderness.  Genitourinary:      Comments: Increase pain on left vaginal wall with increased tension. Skin:    General: Skin is warm and dry.  Neurological:     Mental Status: She is alert and oriented to person, place, and time.       Assessment & Plan:  1.  Pt wishes to remain on HRT.  Will switch to vivelle dot which is a 17 Beta estrogren over premarin a CEE. 2.  Progesterone therapy not necessary (no uterus) 3.  Send Aptima  4.  Topical steroid for left labia 5.  Pelvic PT for vulvar and vaginal pain (left vaginal wall has increased pain and tension than right).

## 2018-09-02 LAB — CERVICOVAGINAL ANCILLARY ONLY
Bacterial vaginitis: POSITIVE — AB
Candida vaginitis: NEGATIVE

## 2018-09-03 ENCOUNTER — Ambulatory Visit: Payer: Medicare Other | Attending: Obstetrics & Gynecology | Admitting: Physical Therapy

## 2018-09-03 ENCOUNTER — Other Ambulatory Visit: Payer: Self-pay

## 2018-09-03 ENCOUNTER — Telehealth: Payer: Self-pay

## 2018-09-03 ENCOUNTER — Other Ambulatory Visit: Payer: Self-pay | Admitting: Obstetrics & Gynecology

## 2018-09-03 ENCOUNTER — Encounter: Payer: Self-pay | Admitting: Physical Therapy

## 2018-09-03 DIAGNOSIS — R279 Unspecified lack of coordination: Secondary | ICD-10-CM | POA: Diagnosis present

## 2018-09-03 DIAGNOSIS — M6281 Muscle weakness (generalized): Secondary | ICD-10-CM | POA: Diagnosis present

## 2018-09-03 MED ORDER — METRONIDAZOLE 500 MG PO TABS: 500.0000 mg | ORAL_TABLET | Freq: Two times a day (BID) | ORAL | 0 refills | Status: DC

## 2018-09-03 NOTE — Therapy (Signed)
[facility] [address] [place], [place], [address] Phone: [phone]   Fax:  [phone]  Physical Therapy Evaluation  Patient Details  Name: Mary [name] MRN: [id number] Date of Birth: [birth year] Referring Provider (PT): Dr. Silas [place]   Encounter Date: 09/03/2018  PT End of Session - 09/03/18 1639    Visit Number  1    Date for PT Re-Evaluation  10/29/18    PT Start Time  1540    PT Stop Time  1630    PT Time Calculation (min)  50 min    Activity Tolerance  Patient tolerated treatment well    Behavior During Therapy  [facility] for tasks assessed/performed       Past Medical History:  Diagnosis Date  . Anxiety   . Complication of anesthesia    "hard to put to sleep", woke up during procedure in the past  . Diverticulosis of colon (without mention of hemorrhage)   . Herniated disc   . Lynch syndrome   . Mental disorder    PTSD  . MVP (mitral valve prolapse)   . PAF (paroxysmal atrial fibrillation) ([place])   . Personal history of colonic polyps 06/26/2007   tubular adenoma  . Shortness of breath    Due to Dilaudid and Morphine  . 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 SVT (supraventricular tachycardia) (HCC)    hx of ablation    Past Surgical History:  Procedure Laterality Date  . ABDOMINAL HERNIA REPAIR    . ABDOMINAL HYSTERECTOMY    . BACK SURGERY     several  . CARDIAC ELECTROPHYSIOLOGY MAPPING AND ABLATION    . CARDIAC ELECTROPHYSIOLOGY MAPPING AND ABLATION  12/2011  . COLON SURGERY  01/23/2016  . COLONOSCOPY    . ENDOMETRIAL ABLATION  04/19/11  . TONSILLECTOMY    . URETHRA SURGERY      There were no vitals filed for this visit.   Subjective Assessment - 09/03/18 1544    Subjective  Patient reports her pelvic floor started after the surgery for cancer 01/2016. Patient had aggressive Chemotherapy. Patient abdominal stays distended. Sometimes has to wear diapers and can totally losing her  bowels. Patient always has pin in left lower quadrant. When urinates it dribbles. Patient is not emptying her bladder fully. Patient has to get up 3 times to urinate.    Patient Stated Goals  reduce the knots in the muscles, reduce pain, improve leakage    Currently in Pain?  Yes    Pain Score  5     Pain Location  Abdomen    Pain Orientation  Left    Pain Descriptors / Indicators  Sharp    Pain Type  Chronic pain    Pain Onset  More than a month ago    Pain Frequency  Intermittent    Aggravating Factors   no reason    Pain Relieving Factors  nothing    Multiple Pain Sites  No         [facility] PT Assessment - 09/03/18 0001      Assessment   Medical Diagnosis  r10.2 pelvic pain    Referring Provider (PT)  Dr. Silas [place]    Onset Date/Surgical Date  01/23/16    Prior Therapy  none      Precautions   Precautions  Other (comment)    Precaution Comments  small bowel cancer      Restrictions   Weight Bearing Restrictions  No  Balance Screen   Has the patient fallen in the past 6 months  No    Has the patient had a decrease in activity level because of a fear of falling?   No    Is the patient reluctant to leave their home because of a fear of falling?   No      Home Film/video editor residence      Prior Function   Level of Independence  Independent    Vocation  On disability    Leisure  none      Cognition   Overall Cognitive Status  Within Functional Limits for tasks assessed      Observation/Other Assessments   Observations  abdominal circumference at the umbilicus 92 cm    Skin Integrity  scars on lower abdomen has decreased mobility      Posture/Postural Control   Posture/Postural Control  Postural limitations    Postural Limitations  Rounded Shoulders;Forward head;Flexed trunk      ROM / Strength   AROM / PROM / Strength  AROM;PROM;Strength      AROM   Lumbar Flexion  decreased by 50%    Lumbar Extension  decreased by 50%     Lumbar - Right Side Bend  decreased by 50%    Lumbar - Left Side Bend  decreased by 50%      PROM   Right Hip External Rotation   45    Left Hip External Rotation   35      Strength   Right Hip Extension  4/5    Right Hip External Rotation   4/5    Right Hip ABduction  4/5    Right Hip ADduction  4/5    Left Hip Extension  4/5    Left Hip ABduction  4/5    Left Hip ADduction  4/5      Palpation   Palpation comment  tenderness located in abdomen with bloating                Objective measurements completed on examination: See above findings.    Pelvic Floor Special Questions - 09/03/18 0001    Prior Pregnancies  No    Currently Sexually Active  Yes    Is this Painful  Yes    Marinoff Scale  discomfort that does not affect completion    Urinary Leakage  No    Urinary frequency  diaper 4 times in one week 2 weeks ago; 1 month wears 2 diapers    Fecal incontinence  Yes   constipation; difficulty pushing stool out; Increased toilet   Fluid intake  type 1 BM; leak watery stool; does not feel when she is going to leak stool    Falling out feeling (prolapse)  Yes    Exam Type  Deferred   wants to perform next visit      [facility] Adult PT Treatment/Exercise - 09/03/18 0001      Neuro Re-ed    Neuro Re-ed Details   diaphragmatic breathing in supine to open the diaphragm and pelvic floor      Manual Therapy   Manual Therapy  Soft tissue mobilization;Manual Lymphatic Drainage (MLD)    Manual therapy comments  educated patient on abdominal massage for home program    Soft tissue mobilization  circular abdominal massage to promote bowel movements and improve bowel movements    Manual Lymphatic Drainage (MLD)  to abdomen with triangle movement, superficial abdominal lymphatics  colon stimulation             PT Education - 09/03/18 1638    Education Details  abdominal massage; diaphragmatic breathingAccess Code: [email or web address]    Person(s) Educated  Patient    Methods   Explanation;Demonstration;Handout    Comprehension  Returned demonstration;Verbalized understanding       PT Short Term Goals - 09/03/18 1656      PT SHORT TERM GOAL #1   Title  independent with intial HEP    Time  4    Period  Weeks    Status  New    Target Date  10/01/18      PT SHORT TERM GOAL #2   Title  understand correct toileting technique to reduce strain on the pelvic floor    Time  4    Period  Weeks    Status  New    Target Date  10/01/18      PT SHORT TERM GOAL #3   Title  able to produce a strong urine stream due to improve relaxation of the pelvic floor    Time  4    Period  Weeks    Status  New    Target Date  10/01/18      PT SHORT TERM GOAL #4   Title  fecal leakage improved >/= 25% due to improve pelvic floor strength and coordination    Time  4    Period  Weeks    Status  New    Target Date  10/01/18        PT Long Term Goals - 09/03/18 1658      PT LONG TERM GOAL #1   Title  independent with HEP and understand how to progress himself    Time  8    Period  Weeks    Status  New    Target Date  10/29/18      PT LONG TERM GOAL #2   Title  fecal leakage reduced >/= 75% due to improve pelvic floor strength and coordination    Time  8    Period  Weeks    Status  New    Target Date  10/29/18      PT LONG TERM GOAL #3   Title  abdominal pain decreased >/= 75% due to reduction of trigger points and improved scar mobility    Time  8    Period  Weeks    Status  New    Target Date  10/29/18      PT LONG TERM GOAL #4   Title  able to have a bowel movement with >/= 50% greater ease due to relaxation of the pelvic floor    Time  8    Period  Weeks    Status  New    Target Date  10/29/18      PT LONG TERM GOAL #5   Title  able to fully empty her bladder fully due to improved pelvic floor coordination    Time  8    Period  Weeks    Status  New    Target Date  10/29/18             Plan - 09/03/18 1640    Clinical Impression  Statement  Patient is a 53 year old female with pelvic pain and constipation and fecal leakage. Patient has a history of small bowel cancer, complete abdominal hysterectomy, abdominal hernia repair and Lynch syndrome. Patient reports  constant left abdominal pain that is sharp. Patient reports she does not know what causes her pain or what helps it. Pelvic floor will be assessed at next visit due to her being sore from her MD appointment. Abominal surgical sites have decreased mobility. Tenderness located throughout the abdomen. Abodminal circumference at the umbilicus is 92 cm. Patient reports her abdomen gets very distended. Abdominal strength is 1/5. Bilateral hip strength is 4/5. Patient reports she wears 2 diapers per month due to fecal leakage. Patient will have to get up in the middle of the night 3 times to go to the bathroom. Patient strains to have a bowel movement and is in the commode for a long time. Patient reports vaginal dryness. Patient will benefit from skilled therapy to improve pelvic floor coordination, reduce abdominal pain, improve overall strength.    Personal Factors and Comorbidities  Fitness;Comorbidity 1;Comorbidity 2;Comorbidity 3+    Comorbidities  abdominal hernia; Lynch Syndome; Abodminal hysterectomy; small bowel cancer;    Examination-Activity Limitations  Continence;Toileting    Examination-Participation Restrictions  Community Activity    Stability/Clinical Decision Making  Evolving/Moderate complexity    Clinical Decision Making  Moderate    Rehab Potential  Good    PT Frequency  2x / week    PT Duration  8 weeks    PT Treatment/Interventions  Biofeedback;Electrical Stimulation;Cryotherapy;Moist Heat;Therapeutic activities;Therapeutic exercise;Patient/family education;Neuromuscular re-education;Manual techniques;Scar mobilization;Dry needling    PT Next Visit Plan  assess pelvic floor, toileting technique; hip stretches to relax pelvic floor, scar massage, pelvic floor  relaxation, work on diaphragm    PT Home Exercise Plan  Access Code: [email or web address]    Consulted and Agree with Plan of Care  Patient       Patient will benefit from skilled therapeutic intervention in order to improve the following deficits and impairments:  Decreased coordination, Decreased range of motion, Increased fascial restricitons, Increased muscle spasms, Pain, Decreased scar mobility, Decreased mobility, Decreased strength  Visit Diagnosis: Muscle weakness (generalized) - Plan: PT plan of care cert/re-cert  Unspecified lack of coordination - Plan: PT plan of care cert/re-cert     Problem List Patient Active Problem List   Diagnosis Date Noted  . Sinus tachycardia 03/30/2013  . PFO (patent foramen ovale) 07/25/2011  . Lynch syndrome 03/06/2011  . DUB (dysfunctional uterine bleeding) 03/06/2011  . Adenocarcinoma of small bowel ([place]) 02/19/2011  . Metrorrhagia 02/14/2011  . Personal history of colonic polyps 02/13/2011  . Constipation 02/13/2011  . GERD (gastroesophageal reflux disease) 02/13/2011  . Rhinitis due to pollen 04/20/2010  . PULMONARY NODULE 03/13/2010  . NIGHT SWEATS 02/07/2010  . AMENORRHEA, SECONDARY 12/06/2009  . EDEMA 09/16/2009  . Obstructive chronic bronchitis without exacerbation ([place]) 07/01/2008  . Myalgia and myositis, unspecified 05/25/2008  . MENORRHAGIA, PERIMENOPAUSAL 05/06/2008  . DIVERTICULOSIS OF COLON 02/20/2007  . OVARIAN CYST 02/06/2007  . Chronic constipation 11/19/2005  . DEPRESSION, MAJOR, RECURRENT 10/09/2005  . ANXIETY 10/09/2005  . TOBACCO DEPENDENCE 10/09/2005  . MITRAL VALVE DISORDER 10/09/2005  . ATRIAL FIBRILLATION 10/09/2005  . BACK PAIN W/RADIATION, UNSPECIFIED 10/09/2005    Earlie Counts, PT 09/03/18 5:05 PM   [facility] [address], [place], [place], [address] Phone: [phone]   Fax:  [phone]  Name: Mary [name] MRN: [id number] Date of  Birth: 06-19-1965

## 2018-09-03 NOTE — Patient Instructions (Addendum)
About Abdominal Massage  Abdominal massage, also called external colon massage, is a self-treatment circular massage technique that can reduce and eliminate gas and ease constipation. The colon naturally contracts in waves in a clockwise direction starting from inside the right hip, moving up toward the ribs, across the belly, and down inside the left hip.  When you perform circular abdominal massage, you help stimulate your colon's normal wave pattern of movement called peristalsis.  It is most beneficial when done after eating.  Positioning You can practice abdominal massage with oil while lying down, or in the shower with soap.  Some people find that it is just as effective to do the massage through clothing while sitting or standing.  How to Massage Start by placing your finger tips or knuckles on your right side, just inside your hip bone.  . Make small circular movements while you move upward toward your rib cage.   . Once you reach the bottom right side of your rib cage, take your circular movements across to the left side of the bottom of your rib cage.  . Next, move downward until you reach the inside of your left hip bone.  This is the path your feces travel in your colon. . Continue to perform your abdominal massage in this pattern for 10 minutes each day.     You can apply as much pressure as is comfortable in your massage.  Start gently and build pressure as you continue to practice.  Notice any areas of pain as you massage; areas of slight pain may be relieved as you massage, but if you have areas of significant or intense pain, consult with your healthcare provider.  Other Considerations . General physical activity including bending and stretching can have a beneficial massage-like effect on the colon.  Deep breathing can also stimulate the colon because breathing deeply activates the same nervous system that supplies the colon.   . Abdominal massage should always be used in  combination with a bowel-conscious diet that is high in the proper type of fiber for you, fluids (primarily water), and a regular exercise program. Access Code: [email or web address]  URL: [email or web address]  Date: 09/03/2018  Prepared by: Earlie Counts   Exercises Supine Abdominal Wall Massage - 1 sets - 3 minutes hold - 1x daily - 7x weekly Supine Diaphragmatic Breathing - 5 reps - 1 sets - 1x daily - 7x weekly [facility] [address], [place], [place] [address] Phone # [phone] Fax [phone]

## 2018-09-03 NOTE — Progress Notes (Signed)
BV treated with Flagyl.  Rx sent to pharmacy.

## 2018-09-03 NOTE — Telephone Encounter (Addendum)
Attempted to call pt with results. Pt did not answer. VM left informing pt of BV and that medication has been sent to pharmacy.  ----- Message from Guss Bunde, MD sent at 09/03/2018  4:07 PM EDT ----- Rx sent to pharmacy.  Pt does not have my chart so will need to called.

## 2018-09-18 ENCOUNTER — Encounter: Payer: Self-pay | Admitting: Physical Therapy

## 2018-09-18 ENCOUNTER — Other Ambulatory Visit: Payer: Self-pay

## 2018-09-18 ENCOUNTER — Ambulatory Visit: Payer: Medicare Other | Admitting: Physical Therapy

## 2018-09-18 DIAGNOSIS — M6281 Muscle weakness (generalized): Secondary | ICD-10-CM | POA: Diagnosis not present

## 2018-09-18 DIAGNOSIS — R279 Unspecified lack of coordination: Secondary | ICD-10-CM

## 2018-09-18 NOTE — Therapy (Signed)
[facility] [address] [place], [place], [address] Phone: [phone]   Fax:  [phone]  Physical Therapy Treatment  Patient Details  Name: Mary [name] MRN: [id number] Date of Birth: [birth year] Referring Provider (PT): Dr. Silas [place]   Encounter Date: 09/18/2018  PT End of Session - 09/18/18 1501    Visit Number  2    Date for PT Re-Evaluation  10/29/18    Authorization Type  medicare    PT Start Time  1400    PT Stop Time  1445    PT Time Calculation (min)  45 min    Activity Tolerance  Patient tolerated treatment well;No increased pain    Behavior During Therapy  [facility] for tasks assessed/performed       Past Medical History:  Diagnosis Date  . Anxiety   . Complication of anesthesia    "hard to put to sleep", woke up during procedure in the past  . Diverticulosis of colon (without mention of hemorrhage)   . Herniated disc   . Lynch syndrome   . Mental disorder    PTSD  . MVP (mitral valve prolapse)   . PAF (paroxysmal atrial fibrillation) ([place])   . Personal history of colonic polyps 06/26/2007   tubular adenoma  . Shortness of breath    Due to Dilaudid and Morphine  . Small bowel cancer ([place]) 01/23/2016  . SVT (supraventricular tachycardia) (HCC)    hx of ablation    Past Surgical History:  Procedure Laterality Date  . ABDOMINAL HERNIA REPAIR    . ABDOMINAL HYSTERECTOMY    . BACK SURGERY     several  . CARDIAC ELECTROPHYSIOLOGY MAPPING AND ABLATION    . CARDIAC ELECTROPHYSIOLOGY MAPPING AND ABLATION  12/2011  . COLON SURGERY  01/23/2016  . COLONOSCOPY    . ENDOMETRIAL ABLATION  04/19/11  . TONSILLECTOMY    . URETHRA SURGERY      There were no vitals filed for this visit.  Subjective Assessment - 09/18/18 1407    Subjective  I had a CT scan on Monday. It showed a hernia wraped with fat wrapped around it. I already had surgery for an incisional hernia.    Patient Stated Goals   reduce the knots in the muscles, reduce pain, improve leakage                    Pelvic Floor Special Questions - 09/18/18 0001    Skin Integrity  Erthema   on the labia minora   Pelvic Floor Internal Exam  Patient confirms identification and approves assessment of the pelvic floor and treatment    Exam Type  Vaginal    Palpation  tenderness located on the perineal body, tightness in the posterior introitus    Strength  weak squeeze, no lift        [facility] Adult PT Treatment/Exercise - 09/18/18 0001      Therapeutic Activites    Therapeutic Activities  Other Therapeutic Activities    Other Therapeutic Activities  instructed patient in correct toileting technique with relaxing the pelvic floor and breathing      Manual Therapy   Manual Therapy  Internal Pelvic Floor    Internal Pelvic Floor  left pelvic floor muscles to release the trigger points, the perineal body             PT Education - 09/18/18 1458    Education Details  toileting technique    Person(s)  Educated  Patient    Methods  Explanation;Demonstration;Handout    Comprehension  Verbalized understanding;Returned demonstration       PT Short Term Goals - 09/18/18 1505      PT SHORT TERM GOAL #1   Title  independent with intial HEP    Time  4    Period  Weeks    Status  On-going    Target Date  10/01/18      PT SHORT TERM GOAL #2   Title  understand correct toileting technique to reduce strain on the pelvic floor    Time  4    Period  Weeks    Status  On-going    Target Date  10/01/18        PT Long Term Goals - 09/03/18 1658      PT LONG TERM GOAL #1   Title  independent with HEP and understand how to progress himself    Time  8    Period  Weeks    Status  New    Target Date  10/29/18      PT LONG TERM GOAL #2   Title  fecal leakage reduced >/= 75% due to improve pelvic floor strength and coordination    Time  8    Period  Weeks    Status  New    Target Date  10/29/18       PT LONG TERM GOAL #3   Title  abdominal pain decreased >/= 75% due to reduction of trigger points and improved scar mobility    Time  8    Period  Weeks    Status  New    Target Date  10/29/18      PT LONG TERM GOAL #4   Title  able to have a bowel movement with >/= 50% greater ease due to relaxation of the pelvic floor    Time  8    Period  Weeks    Status  New    Target Date  10/29/18      PT LONG TERM GOAL #5   Title  able to fully empty her bladder fully due to improved pelvic floor coordination    Time  8    Period  Weeks    Status  New    Target Date  10/29/18            Plan - 09/18/18 1414    Clinical Impression Statement  Patient is doing the abdominal massage which helped for several days after the initial visit. Patient understands the correct toileting technique with abdominal breathing. Pelvic floor strength is 2/5 vaginally. Patient had tenderness located in the left levator ani and obturator internist and perineal body. Patient felt discomfort in the lower abdomen when therapist was working on the left pelvic floor muscles. Patient reports back issues and it sends pain down into the left leg. Patients abdomen still gets distended. Patient has had one accident that required her to wear a diaper. Patient will benefit from skillled therapy to improve pelvic floor coordination, reduce abdominal pain, and improve overall strength.    Personal Factors and Comorbidities  Fitness;Comorbidity 1;Comorbidity 2;Comorbidity 3+    Comorbidities  abdominal hernia; Lynch Syndome; Abodminal hysterectomy; small bowel cancer;    Examination-Activity Limitations  Continence;Toileting    Examination-Participation Restrictions  Community Activity    Stability/Clinical Decision Making  Evolving/Moderate complexity    Rehab Potential  Good    PT Frequency  2x / week  PT Duration  8 weeks    PT Treatment/Interventions  Biofeedback;Electrical Stimulation;Cryotherapy;Moist  Heat;Therapeutic activities;Therapeutic exercise;Patient/family education;Neuromuscular re-education;Manual techniques;Scar mobilization;Dry needling    PT Next Visit Plan  assess pelvic floor in rectal, hip stretches to relax pelvic floor, scar massage, pelvic floor relaxation, work on diaphragm    PT Home Exercise Plan  Access Code: [email or web address]    Recommended Other Services  MD signed intial eva    Consulted and Agree with Plan of Care  Patient       Patient will benefit from skilled therapeutic intervention in order to improve the following deficits and impairments:  Decreased coordination, Decreased range of motion, Increased fascial restricitons, Increased muscle spasms, Pain, Decreased scar mobility, Decreased mobility, Decreased strength  Visit Diagnosis: Muscle weakness (generalized)  Unspecified lack of coordination     Problem List Patient Active Problem List   Diagnosis Date Noted  . Sinus tachycardia 03/30/2013  . PFO (patent foramen ovale) 07/25/2011  . Lynch syndrome 03/06/2011  . DUB (dysfunctional uterine bleeding) 03/06/2011  . Adenocarcinoma of small bowel ([place]) 02/19/2011  . Metrorrhagia 02/14/2011  . Personal history of colonic polyps 02/13/2011  . Constipation 02/13/2011  . GERD (gastroesophageal reflux disease) 02/13/2011  . Rhinitis due to pollen 04/20/2010  . PULMONARY NODULE 03/13/2010  . NIGHT SWEATS 02/07/2010  . AMENORRHEA, SECONDARY 12/06/2009  . EDEMA 09/16/2009  . Obstructive chronic bronchitis without exacerbation ([place]) 07/01/2008  . Myalgia and myositis, unspecified 05/25/2008  . MENORRHAGIA, PERIMENOPAUSAL 05/06/2008  . DIVERTICULOSIS OF COLON 02/20/2007  . OVARIAN CYST 02/06/2007  . Chronic constipation 11/19/2005  . DEPRESSION, MAJOR, RECURRENT 10/09/2005  . ANXIETY 10/09/2005  . TOBACCO DEPENDENCE 10/09/2005  . MITRAL VALVE DISORDER 10/09/2005  . ATRIAL FIBRILLATION 10/09/2005  . BACK PAIN W/RADIATION, UNSPECIFIED 10/09/2005    Earlie Counts, PT 09/18/18 4:31 PM   [facility] [address], [place], [place], [address] Phone: [phone]   Fax:  [phone]  Name: Mary [name] MRN: [id number] Date of Birth: 1965-04-14

## 2018-09-18 NOTE — Patient Instructions (Signed)
Toileting Techniques for Bowel Movements (Defecation) Using your belly (abdomen) and pelvic floor muscles to have a bowel movement is usually instinctive.  Sometimes people can have problems with these muscles and have to relearn proper defecation (emptying) techniques.  If you have weakness in your muscles, organs that are falling out, decreased sensation in your pelvis, or ignore your urge to go, you may find yourself straining to have a bowel movement.  You are straining if you are: . holding your breath or taking in a huge gulp of air and holding it  . keeping your lips and jaw tensed and closed tightly . turning red in the face because of excessive pushing or forcing . developing or worsening your  hemorrhoids . getting faint while pushing . not emptying completely and have to defecate many times a day  If you are straining, you are actually making it harder for yourself to have a bowel movement.  Many people find they are pulling up with the pelvic floor muscles and closing off instead of opening the anus. Due to lack pelvic floor relaxation and coordination the abdominal muscles, one has to work harder to push the feces out.  Many people have never been taught how to defecate efficiently and effectively.  Notice what happens to your body when you are having a bowel movement.  While you are sitting on the toilet pay attention to the following areas: . Jaw and mouth position . Angle of your hips   . Whether your feet touch the ground or not . Arm placement  . Spine position . Waist . Belly tension . Anus (opening of the anal canal)  An Evacuation/Defecation Plan   Here are the 4 basic points:  1. Lean forward enough for your elbows to rest on your knees 2. Support your feet on the floor or use a low stool if your feet don't touch the floor  3. Push out your belly as if you have swallowed a beach ball-you should feel a widening of your waist 4. Open and relax your pelvic floor muscles,  rather than tightening around the anus       The following conditions my require modifications to your toileting posture:  . If you have had surgery in the past that limits your back, hip, pelvic, knee or ankle flexibility . Constipation   Your healthcare practitioner may make the following additional suggestions and adjustments:  1) Sit on the toilet  a) Make sure your feet are supported. b) Notice your hip angle and spine position-most people find it effective to lean forward or raise their knees, which can help the muscles around the anus to relax  c) When you lean forward, place your forearms on your thighs for support  2) Relax suggestions a) Breath deeply in through your nose and out slowly through your mouth as if you are smelling the flowers and blowing out the candles. b) To become aware of how to relax your muscles, contracting and releasing muscles can be helpful.  Pull your pelvic floor muscles in tightly by using the image of holding back gas, or closing around the anus (visualize making a circle smaller) and lifting the anus up and in.  Then release the muscles and your anus should drop down and feel open. Repeat 5 times ending with the feeling of relaxation. c) Keep your pelvic floor muscles relaxed; let your belly bulge out. d) The digestive tract starts at the mouth and ends at the anal opening, so   be sure to relax both ends of the tube.  Place your tongue on the roof of your mouth with your teeth separated.  This helps relax your mouth and will help to relax the anus at the same time.  3) Empty (defecation) a) Keep your pelvic floor and sphincter relaxed, then bulge your anal muscles.  Make the anal opening wide.  b) Stick your belly out as if you have swallowed a beach ball. c) Make your belly wall hard using your belly muscles while continuing to breathe. Doing this makes it easier to open your anus. d) Breath out and give a grunt (or try using other sounds such as  ahhhh, shhhhh, ohhhh or grrrrrrr).  4) Finish a) As you finish your bowel movement, pull the pelvic floor muscles up and in.  This will leave your anus in the proper place rather than remaining pushed out and down. If you leave your anus pushed out and down, it will start to feel as though that is normal and give you incorrect signals about needing to have a bowel movement. [facility] [address], [address] [place], [place] [address] Phone # [phone] Fax [phone]  

## 2018-09-23 ENCOUNTER — Ambulatory Visit: Payer: Medicare Other | Admitting: Physical Therapy

## 2018-09-25 ENCOUNTER — Ambulatory Visit: Payer: Medicare Other | Admitting: Physical Therapy

## 2018-09-30 ENCOUNTER — Encounter: Payer: Medicare Other | Admitting: Physical Therapy

## 2018-10-02 ENCOUNTER — Other Ambulatory Visit: Payer: Self-pay

## 2018-10-02 ENCOUNTER — Encounter: Payer: Self-pay | Admitting: Physical Therapy

## 2018-10-02 ENCOUNTER — Ambulatory Visit: Payer: Medicare Other | Attending: Obstetrics & Gynecology | Admitting: Physical Therapy

## 2018-10-02 DIAGNOSIS — R279 Unspecified lack of coordination: Secondary | ICD-10-CM

## 2018-10-02 DIAGNOSIS — M6281 Muscle weakness (generalized): Secondary | ICD-10-CM | POA: Insufficient documentation

## 2018-10-02 NOTE — Therapy (Signed)
[facility] [address] [place], [place], [address] Phone: [phone]   Fax:  [phone]  Physical Therapy Treatment  Patient Details  Name: Mary [name] MRN: [id number] Date of Birth: [birth year] Referring Provider (PT): Dr. Silas [place]   Encounter Date: 10/02/2018  PT End of Session - 10/02/18 1403    Visit Number  3    Date for PT Re-Evaluation  10/29/18    Authorization Type  medicare    PT Start Time  1400    PT Stop Time  1440    PT Time Calculation (min)  40 min    Activity Tolerance  Patient tolerated treatment well;No increased pain    Behavior During Therapy  WFL for tasks assessed/performed       Past Medical History:  Diagnosis Date  . Anxiety   . Complication of anesthesia    "hard to put to sleep", woke up during procedure in the past  . Diverticulosis of colon (without mention of hemorrhage)   . Herniated disc   . Lynch syndrome   . Mental disorder    PTSD  . MVP (mitral valve prolapse)   . PAF (paroxysmal atrial fibrillation) ([place])   . Personal history of colonic polyps 06/26/2007   tubular adenoma  . Shortness of breath    Due to Dilaudid and Morphine  . Small bowel cancer ([place]) 01/23/2016  . SVT (supraventricular tachycardia) (H[facility])    hx of ablation    Past Surgical History:  Procedure Laterality Date  . ABDOMINAL HERNIA REPAIR    . ABDOMINAL HYSTERECTOMY    . BACK SURGERY     several  . CARDIAC ELECTROPHYSIOLOGY MAPPING AND ABLATION    . CARDIAC ELECTROPHYSIOLOGY MAPPING AND ABLATION  12/2011  . COLON SURGERY  01/23/2016  . COLONOSCOPY    . ENDOMETRIAL ABLATION  04/19/11  . TONSILLECTOMY    . URETHRA SURGERY      There were no vitals filed for this visit.                    [facility] Adult PT Treatment/Exercise - 10/02/18 0001      Lumbar Exercises: Stretches   Active Hamstring Stretch  Right;Left;1 rep;30 seconds   supine   Single Knee to Chest  Stretch  Right;Left;1 rep;20 seconds    Lower Trunk Rotation  2 reps;20 seconds   both sides supine   Quadruped Mid Back Stretch  1 rep;30 seconds    Piriformis Stretch  Right;Left;1 rep;30 seconds      Lumbar Exercises: Quadruped   Madcat/Old Horse  10 reps      Manual Therapy   Manual Therapy  Soft tissue mobilization;Myofascial release;Joint mobilization    Joint Mobilization  lower rib cage mobilization to improve mobility    Soft tissue mobilization  bil. diaphragm and I LOVE You massage to abdomen to improve peristalic motion    Myofascial Release  lower abdomen to release around the bladder and anus, release on the left upper quadrant             PT Education - 10/02/18 1444    Education Details  Access Code: [email or web address]    Person(s) Educated  Patient    Methods  Explanation;Demonstration;Verbal cues;Handout    Comprehension  Verbalized understanding;Returned demonstration       PT Short Term Goals - 10/02/18 1453      PT SHORT TERM GOAL #1   Title  independent with intial HEP  Time  4    Period  Weeks    Status  Achieved      PT SHORT TERM GOAL #2   Title  understand correct toileting technique to reduce strain on the pelvic floor    Time  4    Period  Weeks    Status  Achieved    Target Date  10/01/18      PT SHORT TERM GOAL #3   Title  able to produce a strong urine stream due to improve relaxation of the pelvic floor    Time  4    Period  Weeks    Status  On-going        PT Long Term Goals - 09/03/18 1658      PT LONG TERM GOAL #1   Title  independent with HEP and understand how to progress himself    Time  8    Period  Weeks    Status  New    Target Date  10/29/18      PT LONG TERM GOAL #2   Title  fecal leakage reduced >/= 75% due to improve pelvic floor strength and coordination    Time  8    Period  Weeks    Status  New    Target Date  10/29/18      PT LONG TERM GOAL #3   Title  abdominal pain decreased >/= 75% due to reduction of  trigger points and improved scar mobility    Time  8    Period  Weeks    Status  New    Target Date  10/29/18      PT LONG TERM GOAL #4   Title  able to have a bowel movement with >/= 50% greater ease due to relaxation of the pelvic floor    Time  8    Period  Weeks    Status  New    Target Date  10/29/18      PT LONG TERM GOAL #5   Title  able to fully empty her bladder fully due to improved pelvic floor coordination    Time  8    Period  Weeks    Status  New    Target Date  10/29/18            Plan - 10/02/18 1404    Clinical Impression Statement  Patient has decreased abdominal tissue mobility, rib cage movement and diaphragm which can decrease mobility of the pelvic floor. Patient reports difficulty with constipation the last few days. The abdminal soft tissue work increased bowel sounds so she may have a bowel movement with greater ease. Patient will benefit from skilled therapy to improve pelvic floor coordination reduce abdominal pain, and improve overall strength.    Personal Factors and Comorbidities  Fitness;Comorbidity 1;Comorbidity 2;Comorbidity 3+    Comorbidities  abdominal hernia; Lynch Syndome; Abodminal hysterectomy; small bowel cancer;    Examination-Activity Limitations  Continence;Toileting    Examination-Participation Restrictions  Community Activity    Stability/Clinical Decision Making  Evolving/Moderate complexity    Rehab Potential  Good    PT Frequency  2x / week    PT Duration  8 weeks    PT Treatment/Interventions  Biofeedback;Electrical Stimulation;Cryotherapy;Moist Heat;Therapeutic activities;Therapeutic exercise;Patient/family education;Neuromuscular re-education;Manual techniques;Scar mobilization;Dry needling    PT Next Visit Plan  assess pelvic floor in rectal,  scar massage, pelvic floor relaxation, work on diaphragm, ask about urine stream and fecal leakage    PT Home  Exercise Plan  Access Code: [email or web address]    Consulted and Agree with Plan of  Care  Patient       Patient will benefit from skilled therapeutic intervention in order to improve the following deficits and impairments:  Decreased coordination, Decreased range of motion, Increased fascial restricitons, Increased muscle spasms, Pain, Decreased scar mobility, Decreased mobility, Decreased strength  Visit Diagnosis: Muscle weakness (generalized)  Unspecified lack of coordination     Problem List Patient Active Problem List   Diagnosis Date Noted  . Sinus tachycardia 03/30/2013  . PFO (patent foramen ovale) 07/25/2011  . Lynch syndrome 03/06/2011  . DUB (dysfunctional uterine bleeding) 03/06/2011  . Adenocarcinoma of small bowel ([place]) 02/19/2011  . Metrorrhagia 02/14/2011  . Personal history of colonic polyps 02/13/2011  . Constipation 02/13/2011  . GERD (gastroesophageal reflux disease) 02/13/2011  . Rhinitis due to pollen 04/20/2010  . PULMONARY NODULE 03/13/2010  . NIGHT SWEATS 02/07/2010  . AMENORRHEA, SECONDARY 12/06/2009  . EDEMA 09/16/2009  . Obstructive chronic bronchitis without exacerbation ([place]) 07/01/2008  . Myalgia and myositis, unspecified 05/25/2008  . MENORRHAGIA, PERIMENOPAUSAL 05/06/2008  . DIVERTICULOSIS OF COLON 02/20/2007  . OVARIAN CYST 02/06/2007  . Chronic constipation 11/19/2005  . DEPRESSION, MAJOR, RECURRENT 10/09/2005  . ANXIETY 10/09/2005  . TOBACCO DEPENDENCE 10/09/2005  . MITRAL VALVE DISORDER 10/09/2005  . ATRIAL FIBRILLATION 10/09/2005  . BACK PAIN W/RADIATION, UNSPECIFIED 10/09/2005    Earlie Counts, PT 10/02/18 2:55 PM   [facility] [address] [place], [place], [address] Phone: [phone]   Fax:  [phone]  Name: AISLYN CANNER MRN: [id number] Date of Birth: 1965/09/04

## 2018-10-02 NOTE — Patient Instructions (Signed)
Access Code: [email or web address]  URL: [email or web address]  Date: 10/02/2018  Prepared by: Earlie Counts   Exercises Supine Abdominal Wall Massage - 1 sets - 3 minutes hold - 1x daily - 7x weekly Supine Diaphragmatic Breathing - 5 reps - 1 sets - 1x daily - 7x weekly Supine Figure 4 Piriformis Stretch - 2 reps - 1 sets - 30 sec hold - 1x daily - 7x weekly Hooklying Single Knee to Chest - 2 reps - 1 sets - 15 sec hold - 1x daily - 7x weekly Supine Hamstring Stretch - 2 reps - 1 sets - 30 sec hold - 1x daily - 7x weekly Supine Lower Trunk Rotation - 2 reps - 1 sets - 30 sec hold - 1x daily - 7x weekly Cat-Camel - 10 reps - 1 sets - 1x daily - 7x weekly Child's Pose Stretch - 1 reps - 1 sets - 30 sec hold - 1x daily - 7x weekly  [facility] [address] [place], [place] [address] Phone # [phone] Fax [phone]

## 2018-10-07 ENCOUNTER — Ambulatory Visit: Payer: Medicare Other | Admitting: Physical Therapy

## 2018-10-09 ENCOUNTER — Ambulatory Visit: Payer: Medicare Other | Admitting: Physical Therapy

## 2018-10-14 ENCOUNTER — Ambulatory Visit: Payer: Medicare Other | Admitting: Physical Therapy

## 2018-10-14 ENCOUNTER — Other Ambulatory Visit: Payer: Self-pay

## 2018-10-14 ENCOUNTER — Encounter: Payer: Self-pay | Admitting: Physical Therapy

## 2018-10-14 DIAGNOSIS — R279 Unspecified lack of coordination: Secondary | ICD-10-CM

## 2018-10-14 DIAGNOSIS — M6281 Muscle weakness (generalized): Secondary | ICD-10-CM

## 2018-10-14 NOTE — Therapy (Addendum)
[facility] [address] [place], [place], [address] Phone: [phone]   Fax:  [phone]  Physical Therapy Treatment  Patient Details  Name: Mary [name] MRN: [id number] Date of Birth: Feb 14, 1965 Referring Provider (PT): Dr. Silas [place]   Encounter Date: 10/14/2018  PT End of Session - 10/14/18 1542    Visit Number  4    Date for PT Re-Evaluation  10/29/18    Authorization Type  medicare    PT Start Time  1400    PT Stop Time  1415    PT Time Calculation (min)  15 min    Activity Tolerance  Patient tolerated treatment well;No increased pain    Behavior During Therapy  WFL for tasks assessed/performed       Past Medical History:  Diagnosis Date  . Anxiety   . Complication of anesthesia    "hard to put to sleep", woke up during procedure in the past  . Diverticulosis of colon (without mention of hemorrhage)   . Herniated disc   . Lynch syndrome   . Mental disorder    PTSD  . MVP (mitral valve prolapse)   . PAF (paroxysmal atrial fibrillation) ([place])   . Personal history of colonic polyps 06/26/2007   tubular adenoma  . Shortness of breath    Due to Dilaudid and Morphine  . Small bowel cancer ([place]) 01/23/2016  . SVT (supraventricular tachycardia) ([facility])    hx of ablation    Past Surgical History:  Procedure Laterality Date  . ABDOMINAL HERNIA REPAIR    . ABDOMINAL HYSTERECTOMY    . BACK SURGERY     several  . CARDIAC ELECTROPHYSIOLOGY MAPPING AND ABLATION    . CARDIAC ELECTROPHYSIOLOGY MAPPING AND ABLATION  12/2011  . COLON SURGERY  01/23/2016  . COLONOSCOPY    . ENDOMETRIAL ABLATION  04/19/11  . TONSILLECTOMY    . URETHRA SURGERY      There were no vitals filed for this visit.  Subjective Assessment - 10/14/18 1408    Subjective  I feel nauseas right now. Friday I was not able to get off the commode and by night time I had to wear a diaper. I had bright red blood when on the commode. I  did not call the doctor. I do it quite a bit with bleeding. Sunday night I felt stool on my leg and had no idea I was leaking stool.    Patient Stated Goals  reduce the knots in the muscles, reduce pain, improve leakage                       [facility] Adult PT Treatment/Exercise - 10/14/18 0001      Self-Care   Self-Care  Other Self-Care Comments    Other Self-Care Comments   discussed with patient on taking care of the anal area after diarrhea, and being on the commode for so long including contracting the anus and using a cream to reduce irritation to the anal area.              PT Education - 10/14/18 1534    Education Details  ways to contract the pelvic floor to restore tone to the pelvic floor, how to take care of the anal area    Person(s) Educated  Patient    Methods  Explanation    Comprehension  Verbalized understanding       PT Short Term Goals - 10/14/18 1416  PT SHORT TERM GOAL #1   Title  independent with intial HEP    Time  4    Period  Weeks    Status  Achieved    Target Date  10/01/18      PT SHORT TERM GOAL #2   Title  understand correct toileting technique to reduce strain on the pelvic floor    Time  4    Period  Weeks    Status  Achieved    Target Date  10/01/18      PT SHORT TERM GOAL #3   Title  able to produce a strong urine stream due to improve relaxation of the pelvic floor    Baseline  35% stronger urine stream    Time  4    Period  Weeks    Status  On-going      PT SHORT TERM GOAL #4   Title  fecal leakage improved >/= 25% due to improve pelvic floor strength and coordination    Baseline  2 times of fecal leakage since last visit.    Time  4    Period  Weeks    Status  On-going    Target Date  10/01/18        PT Long Term Goals - 09/03/18 1658      PT LONG TERM GOAL #1   Title  independent with HEP and understand how to progress himself    Time  8    Period  Weeks    Status  New    Target Date  10/29/18       PT LONG TERM GOAL #2   Title  fecal leakage reduced >/= 75% due to improve pelvic floor strength and coordination    Time  8    Period  Weeks    Status  New    Target Date  10/29/18      PT LONG TERM GOAL #3   Title  abdominal pain decreased >/= 75% due to reduction of trigger points and improved scar mobility    Time  8    Period  Weeks    Status  New    Target Date  10/29/18      PT LONG TERM GOAL #4   Title  able to have a bowel movement with >/= 50% greater ease due to relaxation of the pelvic floor    Time  8    Period  Weeks    Status  New    Target Date  10/29/18      PT LONG TERM GOAL #5   Title  able to fully empty her bladder fully due to improved pelvic floor coordination    Time  8    Period  Weeks    Status  New    Target Date  10/29/18            Plan - 10/14/18 1415    Clinical Impression Statement  Patient had to leave early due to not feeling well. Having nausea and sweating. Patient understood ways to take care of the anal region with frequent stool. Patient will benefit from skilled therapy to improve pelvic floor coordination reduce abdominal pian and improve overall strength.    Personal Factors and Comorbidities  Fitness;Comorbidity 1;Comorbidity 2;Comorbidity 3+    Comorbidities  abdominal hernia; Lynch Syndome; Abodminal hysterectomy; small bowel cancer;    Examination-Activity Limitations  Continence;Toileting    Examination-Participation Restrictions  Community Activity    Stability/Clinical Decision  Making  Evolving/Moderate complexity    Rehab Potential  Good    PT Frequency  2x / week    PT Duration  8 weeks    PT Treatment/Interventions  Biofeedback;Electrical Stimulation;Cryotherapy;Moist Heat;Therapeutic activities;Therapeutic exercise;Patient/family education;Neuromuscular re-education;Manual techniques;Scar mobilization;Dry needling    PT Next Visit Plan  assess pelvic floor in rectal,  scar massage, pelvic floor relaxation, work  on diaphragm, ask about urine stream and fecal leakage    PT Home Exercise Plan  Access Code: [email or web address]    Consulted and Agree with Plan of Care  Patient       Patient will benefit from skilled therapeutic intervention in order to improve the following deficits and impairments:  Decreased coordination, Decreased range of motion, Increased fascial restricitons, Increased muscle spasms, Pain, Decreased scar mobility, Decreased mobility, Decreased strength  Visit Diagnosis: Muscle weakness (generalized)  Unspecified lack of coordination     Problem List Patient Active Problem List   Diagnosis Date Noted  . Sinus tachycardia 03/30/2013  . PFO (patent foramen ovale) 07/25/2011  . Lynch syndrome 03/06/2011  . DUB (dysfunctional uterine bleeding) 03/06/2011  . Adenocarcinoma of small bowel ([place]) 02/19/2011  . Metrorrhagia 02/14/2011  . Personal history of colonic polyps 02/13/2011  . Constipation 02/13/2011  . GERD (gastroesophageal reflux disease) 02/13/2011  . Rhinitis due to pollen 04/20/2010  . PULMONARY NODULE 03/13/2010  . NIGHT SWEATS 02/07/2010  . AMENORRHEA, SECONDARY 12/06/2009  . EDEMA 09/16/2009  . Obstructive chronic bronchitis without exacerbation ([place]) 07/01/2008  . Myalgia and myositis, unspecified 05/25/2008  . MENORRHAGIA, PERIMENOPAUSAL 05/06/2008  . DIVERTICULOSIS OF COLON 02/20/2007  . OVARIAN CYST 02/06/2007  . Chronic constipation 11/19/2005  . DEPRESSION, MAJOR, RECURRENT 10/09/2005  . ANXIETY 10/09/2005  . TOBACCO DEPENDENCE 10/09/2005  . MITRAL VALVE DISORDER 10/09/2005  . ATRIAL FIBRILLATION 10/09/2005  . BACK PAIN W/RADIATION, UNSPECIFIED 10/09/2005    Earlie Counts, PT 10/14/18 3:45 PM    [facility] [address] [place], [place], [address] Phone: [phone]   Fax:  [phone]  Name: Mary [name] MRN: [id number] Date of Birth: 25-Jul-1965  PHYSICAL THERAPY DISCHARGE  SUMMARY  Visits from Start of Care: 4  Current functional level related to goals / functional outcomes: See above.    Remaining deficits: Patient stopped therapy on 10/14/2018 due to waiting to see a doctor in November. Patient has not called back since then to rescheduled any further visits.    Education / Equipment: HEP Plan:                                                    Patient goals were not met. Patient is being discharged due to not returning since the last visit. Thank you for the referral. Earlie Counts, PT 12/09/18 11:57 AM   ?????

## 2018-10-16 ENCOUNTER — Ambulatory Visit: Payer: Medicare Other | Admitting: Physical Therapy

## 2018-10-21 ENCOUNTER — Encounter: Payer: Medicare Other | Admitting: Physical Therapy

## 2018-10-23 ENCOUNTER — Other Ambulatory Visit: Payer: Self-pay

## 2018-10-23 ENCOUNTER — Telehealth: Payer: Self-pay

## 2018-10-23 DIAGNOSIS — R232 Flushing: Secondary | ICD-10-CM

## 2018-10-23 MED ORDER — ESTRADIOL 0.1 MG/24HR TD PTTW: 1.0000 | MEDICATED_PATCH | TRANSDERMAL | 12 refills | Status: DC

## 2018-10-23 NOTE — Progress Notes (Signed)
Rx for Vivelle dot sent per Dr.Leggett

## 2018-10-23 NOTE — Telephone Encounter (Addendum)
Attempted to call pt to discuss changing medication. Pt did not answer and VM left asking pt to return call to office. Office number provided.   ----- Message from Guss Bunde, MD sent at 10/23/2018  5:41 AM EDT ----- Regarding: RE: Medications Hi D,  The patch doesn't come in a .09.  Next dose up is 0.1.  Is she having hot flashes on the 0.75?  If so, we can increase the patch to 0.1 mg. ----- Message ----- From: Lyndal Rainbow, CMA Sent: 10/20/2018   1:14 PM EDT To: Guss Bunde, MD Subject: Medications                                    Hey Dr.Leggett,  This pt called and wants to know if you can change her dosage on her Vivelle-Dot. This is what she said, " I was on 0.9 pill and got changed to .075 patch and now I would like a .09 patch". She would like it sent to [facility] on [address]. Please advise.

## 2018-10-28 ENCOUNTER — Ambulatory Visit: Payer: Medicare Other | Admitting: Physical Therapy

## 2018-11-19 IMAGING — DX DG HAND COMPLETE 3+V*L*
3 series · 3 of 3 positions shown · non-contrast
Comparison: None.

CLINICAL DATA: Initial encounter for Pt states she was bitten by
her cat yesterday on her left hand. Puncture wounds to proximal
anterior and posterior portions of left thumb with redness and
swelling to entire hand.

EXAM:
LEFT HAND - COMPLETE 3+ VIEW

[hand pa]
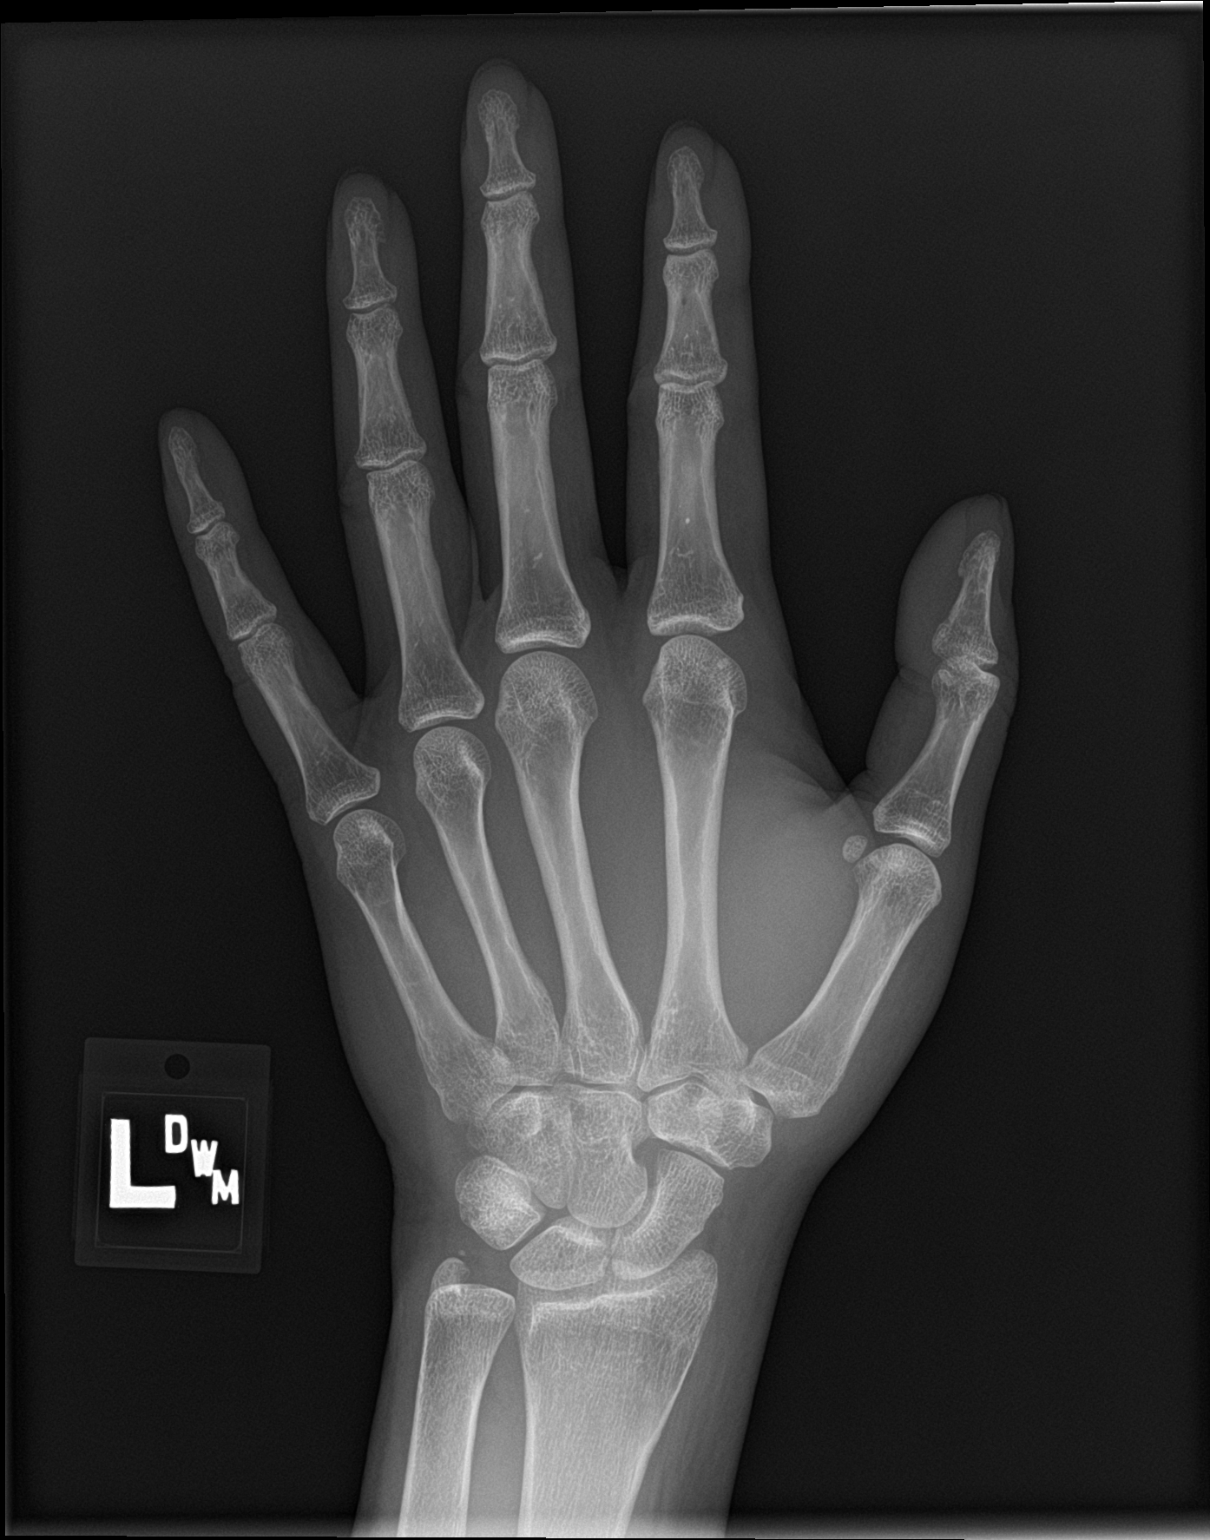

[hand obl]
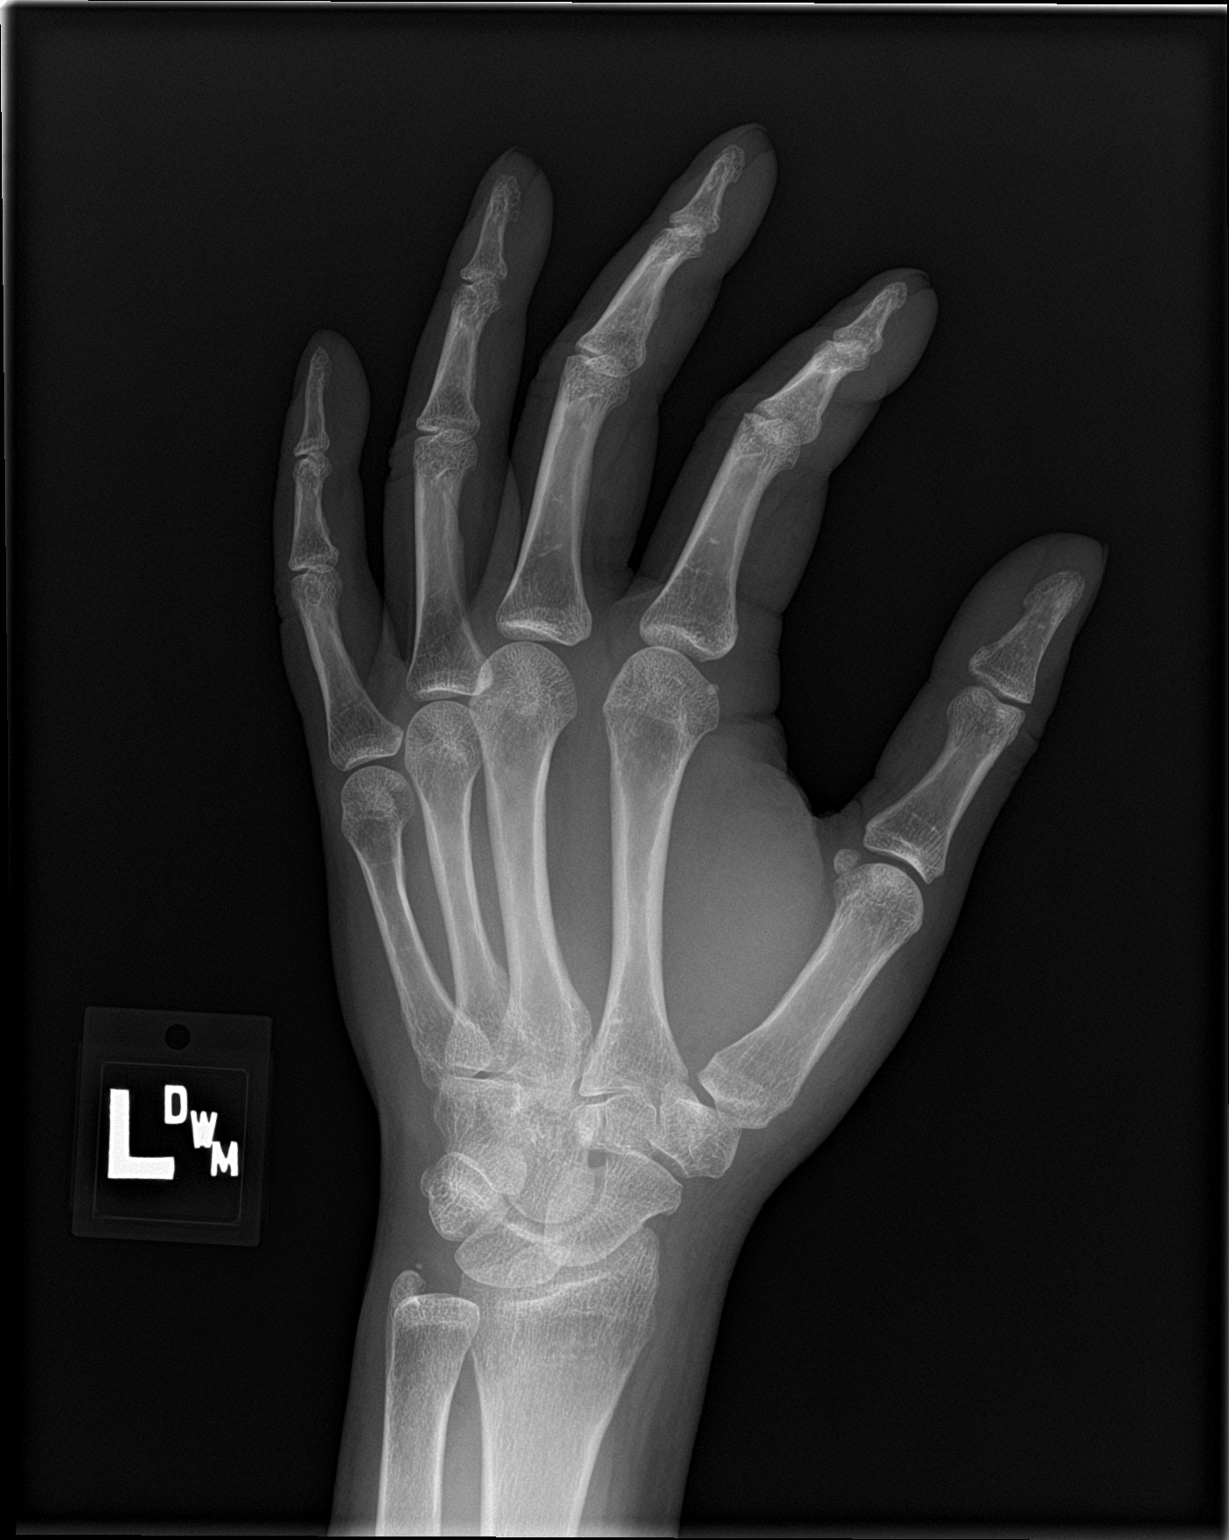

[hand lat]
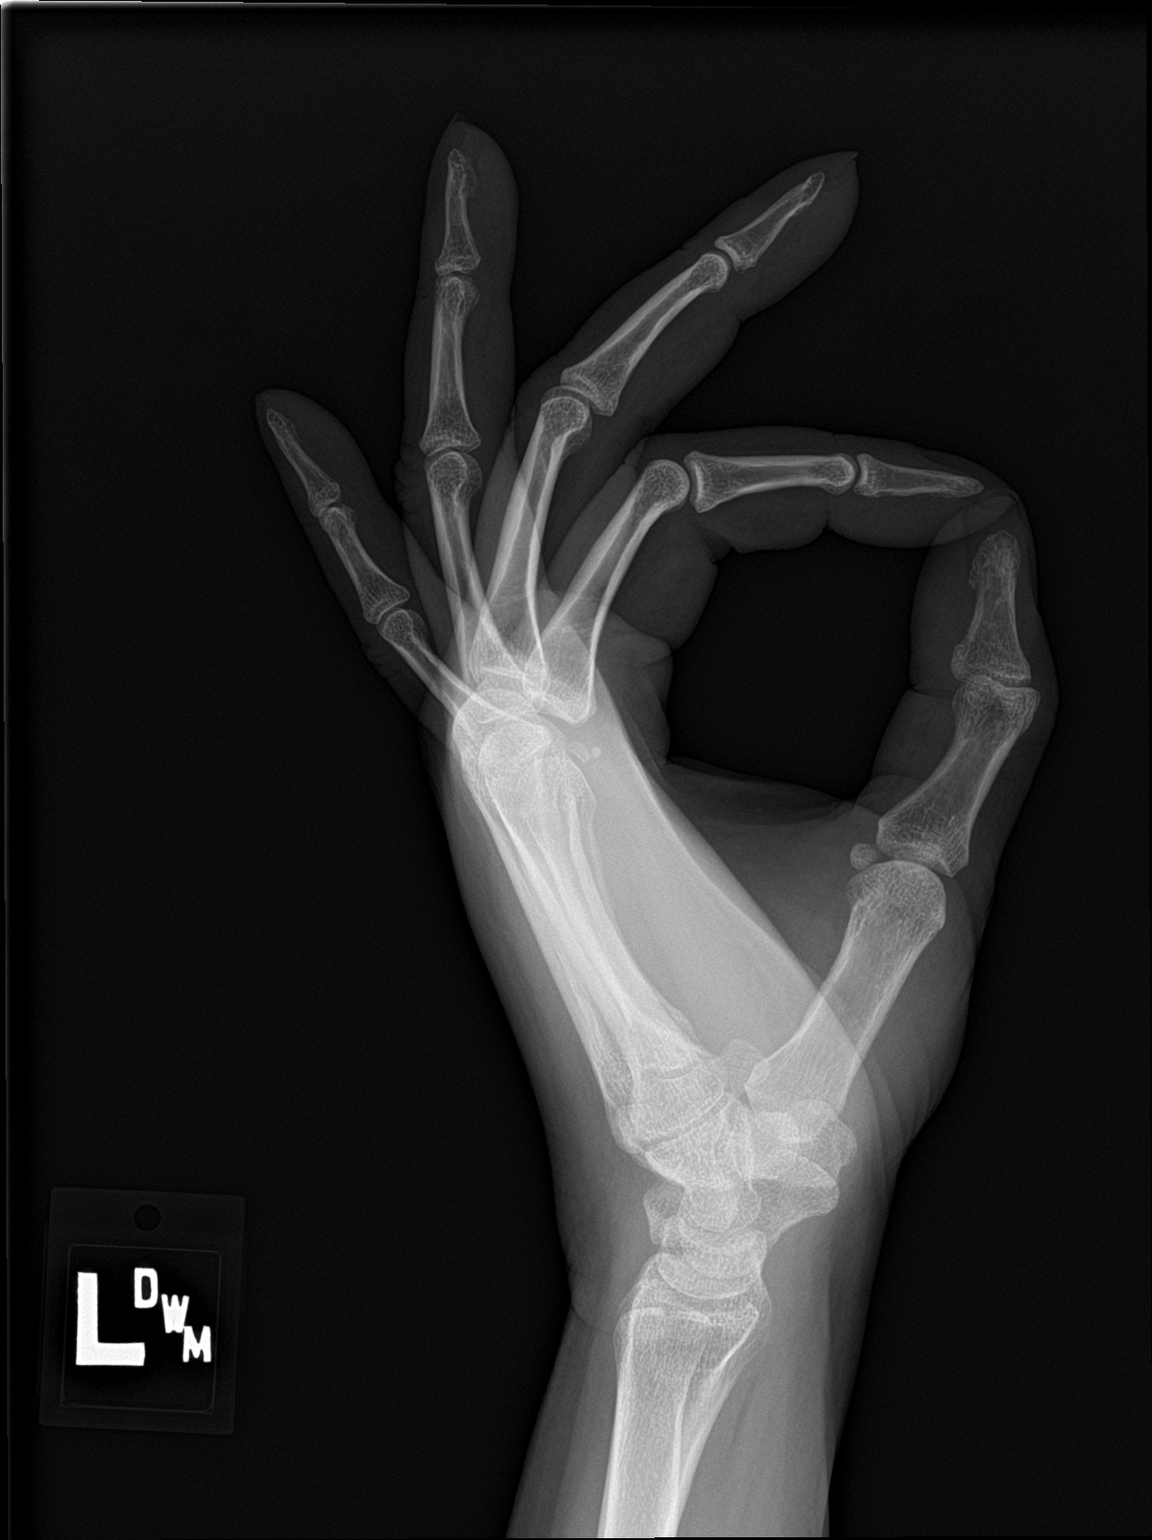

[3 of 3 positions shown; findings below may reference images not displayed]

FINDINGS: Soft tissue swelling about the radial aspect of the hand. No soft
tissue gas. No acute fracture or dislocation. No radiopaque foreign
object. Tiny osseous density adjacent the ulnar styloid could relate
to remote trauma or soft tissue calcification.
IMPRESSION: Soft tissue swelling, without acute osseous abnormality.

## 2018-12-05 ENCOUNTER — Encounter

## 2019-01-23 ENCOUNTER — Ambulatory Visit (INDEPENDENT_AMBULATORY_CARE_PROVIDER_SITE_OTHER): Payer: Medicare Other | Admitting: Sports Medicine

## 2019-01-23 DIAGNOSIS — S61011A Laceration without foreign body of right thumb without damage to nail, initial encounter: Secondary | ICD-10-CM | POA: Insufficient documentation

## 2019-01-23 DIAGNOSIS — S61111A Laceration without foreign body of right thumb with damage to nail, initial encounter: Secondary | ICD-10-CM

## 2019-01-23 DIAGNOSIS — Z23 Encounter for immunization: Secondary | ICD-10-CM

## 2019-01-23 MED ORDER — CEPHALEXIN 500 MG PO CAPS: 500.0000 mg | ORAL_CAPSULE | Freq: Two times a day (BID) | ORAL | 0 refills | Status: DC

## 2019-01-23 MED ORDER — HYDROCODONE-ACETAMINOPHEN 5-325 MG PO TABS: 1.0000 | ORAL_TABLET | Freq: Three times a day (TID) | ORAL | 0 refills | Status: DC | PRN

## 2019-01-23 NOTE — Assessment & Plan Note (Signed)
Deep laceration to the tip of the right thumb, she took off a little bit of the lateral nail. I performed a complex wound closure with undermining of the edges of the skin. She is due for a Tdap today. Because of how long the wound was open we are going to add a week of Keflex. Return to see me in 1 week for suture removal. Hydrocodone for pain. We did discuss the possibility of anesthesia to the tip of the thumb permanently.

## 2019-01-23 NOTE — Progress Notes (Signed)
    Procedures performed today:    Complex laceration repair with wound edge undermining Indication: bleeding Location: Right thumb Size: 1.5 cm Anesthesia: 1%lidocaine without epi, good effect Wound explored, irrigated, FB removed (if present) Type of suture material: After undermining the edges of the wound to decrease tension I used four 5-0 Ethilon sutures, some through the nail plate and nailbed to close the wound. Tolerated well Routine postprocedure instructions d/w pt- keep area clean and bandaged, follow up if concerns/spreading erythema/pain.   Follow up for for suture removal.  Tdap given today.  Independent interpretation of tests performed by another provider:   None.  Impression and Recommendations:    Laceration of thumb, right Deep laceration to the tip of the right thumb, she took off a little bit of the lateral nail. I performed a complex wound closure with undermining of the edges of the skin. She is due for a Tdap today. Because of how long the wound was open we are going to add a week of Keflex. Return to see me in 1 week for suture removal. Hydrocodone for pain. We did discuss the possibility of anesthesia to the tip of the thumb permanently.    ___________________________________________ Gwen Her. [name], M.D., ABFM., CAQSM. Primary Care and [place] Instructor of [place] of [facility]

## 2019-01-23 NOTE — Patient Instructions (Signed)
Laceration Care, Adult  A laceration is a cut that may go through all layers of the skin and into the tissue that is right under the skin. Some lacerations heal on their own. Others need to be closed with stitches (sutures), staples, skin adhesive strips, or skin glue. Proper care of a laceration reduces the risk for infection, helps the laceration heal better, and may prevent scarring.  How to care for your laceration  Wash your hands with soap and water before touching your wound or changing your bandage (dressing). If soap and water are not available, use hand sanitizer.  Keep the wound clean and dry.  If you were given a dressing, you should change it at least once a day, or as told by your health care provider. You should also change it if it becomes wet or dirty.  If sutures or staples were used:  · Keep the wound completely dry for the first 24 hours, or as told by your health care provider. After that time, you may shower or bathe. However, make sure that the wound is not soaked in water until after the sutures or staples have been removed.  · Clean the wound once each day, or as told by your health care provider:  ? Wash the wound with soap and water.  ? Rinse the wound with water to remove all soap.  ? Pat the wound dry with a clean towel. Do not rub the wound.  · After cleaning the wound, apply a thin layer of antibiotic ointment as told by your health care provider. This will help prevent infection and keep the dressing from sticking to the wound.  · Have the sutures or staples removed as told by your health care provider.  If skin adhesive strips were used:  · Do not get the skin adhesive strips wet. You may shower or bathe, but be careful to keep the wound dry.  · If the wound gets wet, pat it dry with a clean towel. Do not rub the wound.  · Skin adhesive strips fall off on their own. You may trim the strips as the wound heals. Do not remove skin adhesive strips that are still stuck to the wound. They  will fall off in time.  If skin glue was used:  · Try to keep the wound dry, but you may briefly wet it in the shower or bath. Do not soak the wound in water, such as by swimming.  · After you have showered or bathed, gently pat the wound dry with a clean towel. Do not rub the wound.  · Do not do any activities that will make you sweat heavily until the skin glue has fallen off on its own.  · Do not apply liquid, cream, or ointment medicine to the wound while the skin glue is in place. Using those may loosen the film before the wound has healed.  · If a dressing is placed over the wound, be careful not to apply tape directly over the skin glue. Doing that may cause the glue to be pulled off before the wound has healed.  · Do not pick at the glue. Skin glue usually remains in place for 5-10 days and then falls off the skin.  General instructions    · Take over-the-counter and prescription medicines only as told by your health care provider.  · If you were prescribed an antibiotic medicine or ointment, take or apply it as told by your health care provider.   infection. Watch for: ? Redness, swelling, or pain. ? Fluid, blood, or pus.  Raise (elevate) the injured area above the level of your heart while you are sitting or lying down for the first 24-48 hours after the laceration is repaired.  If directed, put ice on the affected area: ? Put ice in a plastic bag. ? Place a towel between your skin and the bag. ? Leave the ice on for 20 minutes, 2-3 times a day.  Keep all follow-up visits as told by your health care provider. This is important. Contact a health care provider if:  You received a tetanus shot and you have swelling, severe pain, redness, or bleeding at the injection site.  You have a fever.  A wound that was closed breaks open.  You notice a bad smell  coming from your wound or your dressing.  You notice something coming out of the wound, such as wood or glass.  Your pain is not controlled with medicine.  You have increased redness, swelling, or pain at the site of your wound.  You have fluid, blood, or pus coming from your wound.  You need to change the dressing often due to fluid, blood, or pus that is draining from the wound.  You develop a new rash.  You develop numbness around the wound. Get help right away if:  You develop severe swelling around the wound.  Your pain suddenly increases and is severe.  You develop painful lumps near the wound or on skin anywhere else on your body.  You have a red streak going away from your wound.  The wound is on your hand or foot and you cannot properly move a finger or toe.  The wound is on your hand or foot, and you notice that your fingers or toes look pale or bluish. Summary  A laceration is a cut that may go through all layers of the skin and into the tissue that is right under the skin.  Some lacerations heal on their own. Others need to be closed with stitches (sutures), staples, skin adhesive strips, or skin glue.  Proper care of a laceration reduces the risk of infection, helps the laceration heal better, and prevents scarring. This information is not intended to replace advice given to you by your health care provider. Make sure you discuss any questions you have with your health care provider. Document Revised: 02/15/2017 Document Reviewed: 01/07/2017 Elsevier Patient Education  [employer].

## 2019-01-26 ENCOUNTER — Telehealth: Payer: Self-pay

## 2019-01-26 NOTE — Telephone Encounter (Signed)
Pt was seen on 01/23/2019 regarding a laceration of the right thumb. Pt wants to know if she needs to change the dressing on her thumb, and if so, how often. Please advise.

## 2019-01-26 NOTE — Telephone Encounter (Signed)
Yes, every day.

## 2019-01-27 NOTE — Telephone Encounter (Signed)
Patient aware of instructions/recommendations via voicemail. Instructed patient to call back if there are any questions or concerns.   

## 2019-01-30 ENCOUNTER — Ambulatory Visit (INDEPENDENT_AMBULATORY_CARE_PROVIDER_SITE_OTHER): Payer: Medicare Other | Admitting: Sports Medicine

## 2019-01-30 ENCOUNTER — Other Ambulatory Visit: Payer: Self-pay

## 2019-01-30 DIAGNOSIS — S61111A Laceration without foreign body of right thumb with damage to nail, initial encounter: Secondary | ICD-10-CM

## 2019-01-30 MED ORDER — HYDROCODONE-ACETAMINOPHEN 10-325 MG PO TABS: 1.0000 | ORAL_TABLET | Freq: Three times a day (TID) | ORAL | 0 refills | Status: DC | PRN

## 2019-01-30 NOTE — Assessment & Plan Note (Signed)
1 week ago I performed a complex repair of a deep laceration at the tip of the right thumb. Overall things are looking good, I removed 3 of the 4 sutures today. She is still having significant pain, the tip of her thumb is sensate which is good news. Increasing hydrocodone to 10/325. I like to see her back in 1 week to remove the last suture.

## 2019-01-30 NOTE — Progress Notes (Signed)
    Procedures performed today:    None.  Independent interpretation of tests performed by another provider:   None.  Impression and Recommendations:    Laceration of thumb, right 1 week ago I performed a complex repair of a deep laceration at the tip of the right thumb. Overall things are looking good, I removed 3 of the 4 sutures today. She is still having significant pain, the tip of her thumb is sensate which is good news. Increasing hydrocodone to 10/325. I like to see her back in 1 week to remove the last suture.     ___________________________________________ Gwen Her. [name], M.D., ABFM., CAQSM. Primary Care and [place] Instructor of [place] of [facility]

## 2019-02-25 ENCOUNTER — Encounter: Payer: Self-pay | Admitting: Sports Medicine

## 2019-02-25 ENCOUNTER — Other Ambulatory Visit: Payer: Self-pay

## 2019-02-25 ENCOUNTER — Ambulatory Visit (INDEPENDENT_AMBULATORY_CARE_PROVIDER_SITE_OTHER): Payer: Medicare Other | Admitting: Sports Medicine

## 2019-02-25 DIAGNOSIS — S61111D Laceration without foreign body of right thumb with damage to nail, subsequent encounter: Secondary | ICD-10-CM | POA: Diagnosis not present

## 2019-02-25 DIAGNOSIS — S61211A Laceration without foreign body of left index finger without damage to nail, initial encounter: Secondary | ICD-10-CM | POA: Insufficient documentation

## 2019-02-25 NOTE — Assessment & Plan Note (Signed)
Donnas thumb looks good, I performed a complex repair just over a month ago. Today I removed the last suture. Return as needed for this.

## 2019-02-25 NOTE — Assessment & Plan Note (Signed)
Mary [name] also just lacerated her left index finger.  It is a small laceration, see above for repair template. Return in 1 week for suture removal.

## 2019-02-25 NOTE — Progress Notes (Signed)
Subjective:   Mary [name] is a 54 y.o. female who presents for Medicare Annual (Subsequent) preventive examination.  Review of Systems:  No ROS.  Medicare Wellness Virtual Visit.  Visual/audio telehealth visit, UTA vital signs.   See social history for additional risk factors.    Cardiac Risk Factors include: advanced age (>50men, >77 women);sedentary lifestyle;Other (see comment), Risk factor comments: in alot of pain Sleep patterns:Getting 6 hours of sleep a night. Wakes up 1 time a night to void. Wakes up and feels sluggish.    Home Safety/Smoke Alarms: Feels safe in home. Smoke alarms in place.  Living environment; Lives with fiancee in a 1 story home and no stairs in or around the home. Shower is a walk in shower and grab bars in place. Seat Belt Safety/Bike Helmet: Wears seat belt.   Female:   Pap-Doesn't have done - hysterectomy       Mammo- UTD      Dexa scan-  Not eligible due to age     79- UTD     Objective:     Vitals: BP 127/74   Pulse 87   Ht 5\' 4"  (1.626 m)   Wt 136 lb (61.7 kg)   LMP 03/10/2011   SpO2 100%   BMI 23.34 kg/m   Body mass index is 23.34 kg/m.  Advanced Directives 03/11/2019 09/03/2018 03/10/2018 03/15/2016 04/11/2011  Does Patient Have a Medical Advance Directive? Yes Yes Yes Yes Patient does not have advance directive  Type of Advance Directive [place];Living will Living will;Healthcare Power of Attorney Living will;Healthcare Power of [name];Living will -  Does patient want to make changes to medical advance directive? No - Patient declined No - Patient declined No - Patient declined - -  Copy of [name] in Chart? No - copy requested No - copy requested No - copy requested No - copy requested -    Tobacco Social History   Tobacco Use  Smoking Status Former Smoker  . Packs/day: 0.80  . Years: 28.00  . Pack years: 22.40  . Types: Cigarettes  . Quit date: 2017  .  Years since quitting: 4.1  Smokeless Tobacco Never Used  Tobacco Comment   started smoking at age 5. Counseling sheet given in exam room to quit smoking 02-13-2011; 03/15/16- quit smoking cigs, only vaping     Counseling given: Not Answered Comment: started smoking at age 44. Counseling sheet given in exam room to quit smoking 02-13-2011; 03/15/16- quit smoking cigs, only vaping   Clinical Intake:  Pre-visit preparation completed: Yes  Pain : 0-10 Pain Score: 7  Pain Type: Chronic pain Pain Location: Back Pain Orientation: Right, Left Pain Radiating Towards: to legs Pain Descriptors / Indicators: Aching, Stabbing, Throbbing, Sharp Pain Onset: More than a month ago Pain Frequency: Constant Pain Relieving Factors: medication  Pain Relieving Factors: medication  Nutritional Risks: None Diabetes: No  How often do you need to have someone help you when you read instructions, pamphlets, or other written materials from your doctor or pharmacy?: 1 - Never What is the last grade level you completed in school?: 16  Interpreter Needed?: No  Information entered by :: Orlie Dakin, LPN  Past Medical History:  Diagnosis Date  . Anxiety   . Complication of anesthesia    "hard to put to sleep", woke up during procedure in the past  . Diverticulosis of colon (without mention of hemorrhage)   . H/O: hysterectomy   .  Herniated disc   . Lynch syndrome   . Mental disorder    PTSD  . MVP (mitral valve prolapse)   . PAF (paroxysmal atrial fibrillation) ([place])   . Personal history of colonic polyps 06/26/2007   tubular adenoma  . Shortness of breath    Due to Dilaudid and Morphine  . Small bowel cancer ([place]) 01/23/2016  . SVT (supraventricular tachycardia) ([facility])    hx of ablation   Past Surgical History:  Procedure Laterality Date  . ABDOMINAL HERNIA REPAIR    . ABDOMINAL HYSTERECTOMY    . BACK SURGERY     several  . CARDIAC ELECTROPHYSIOLOGY MAPPING AND ABLATION    . CARDIAC  ELECTROPHYSIOLOGY MAPPING AND ABLATION  12/2011  . COLON SURGERY  01/23/2016  . COLONOSCOPY    . ENDOMETRIAL ABLATION  04/19/11  . TONSILLECTOMY    . URETHRA SURGERY     Family History  Problem Relation Age of Onset  . Coronary artery disease Father   . Hypertension Father   . Colon cancer Father   . Neuropathy Mother   . Stomach cancer Paternal Aunt    Social History   Socioeconomic History  . Marital status: Widowed    Spouse name: Marden Noble  . Number of children: 0  . Years of education: 48  . Highest education level: Associate degree: academic program  Occupational History  . Occupation: diability    Comment: retired  Tobacco Use  . Smoking status: Former Smoker    Packs/day: 0.80    Years: 28.00    Pack years: 22.40    Types: Cigarettes    Quit date: 2017    Years since quitting: 4.1  . Smokeless tobacco: Never Used  . Tobacco comment: started smoking at age 89. Counseling sheet given in exam room to quit smoking 02-13-2011; 03/15/16- quit smoking cigs, only vaping  Substance and Sexual Activity  . Alcohol use: Yes    Alcohol/week: 1.0 standard drinks    Types: 1 Glasses of wine per week    Comment: rarely  . Drug use: No  . Sexual activity: Yes    Birth control/protection: Surgical  Other Topics Concern  . Not on file  Social History Narrative   Pt stays in a lot of pain so does not do much daily. There are times she stays in bed all day due to the pain.  Is engaged to get married   Social Determinants of Radio broadcast assistant Strain:   . Difficulty of Paying Living Expenses: Not on file  Food Insecurity:   . Worried About Charity fundraiser in the Last Year: Not on file  . Ran Out of Food in the Last Year: Not on file  Transportation Needs:   . Lack of Transportation (Medical): Not on file  . Lack of Transportation (Non-Medical): Not on file  Physical Activity:   . Days of Exercise per Week: Not on file  . Minutes of Exercise per Session: Not on file   Stress:   . Feeling of Stress : Not on file  Social Connections:   . Frequency of Communication with Friends and Family: Not on file  . Frequency of Social Gatherings with Friends and Family: Not on file  . Attends Religious Services: Not on file  . Active Member of Clubs or Organizations: Not on file  . Attends Archivist Meetings: Not on file  . Marital Status: Not on file    Outpatient Encounter Medications as of  03/11/2019  Medication Sig  . Buprenorphine HCl (BELBUCA) 900 MCG FILM Place 900 mg inside cheek. Apply one patch twice a day  . buPROPion (WELLBUTRIN SR) 150 MG 12 hr tablet Take 150 mg by mouth 2 (two) times daily.  . clonazePAM (KLONOPIN) 1 MG tablet Take 1 to 2 tabs at night  . estradiol (VIVELLE-DOT) 0.1 MG/24HR patch Place 1 patch (0.1 mg total) onto the skin 2 (two) times a week.  . linaclotide (LINZESS) 290 MCG CAPS capsule Take 290 mcg by mouth daily before breakfast.  . methylphenidate (RITALIN) 10 MG tablet Take 30 mg by mouth 2 (two) times daily.  . metoprolol succinate (TOPROL-XL) 25 MG 24 hr tablet Take 25 mg by mouth daily.  . Multiple Vitamin (MULTIVITAMIN) tablet Take by mouth.  Marland Kitchen omeprazole (PRILOSEC) 40 MG capsule Take by mouth.  . ondansetron (ZOFRAN ODT) 4 MG disintegrating tablet Take one tab by mouth Q6hr prn nausea.  Dissolve under tongue.  . topiramate (TOPAMAX) 50 MG tablet TK 1 T PO TID WITH FOOD  . triamcinolone ointment (KENALOG) 0.5 % Apply twice a day for 1 week and once a day for 1 week to affected area  . [DISCONTINUED] lansoprazole (PREVACID) 30 MG capsule Take by mouth.  Marland Kitchen HYDROcodone-acetaminophen (NORCO) 10-325 MG tablet Take 1 tablet by mouth every 8 (eight) hours as needed. (Patient not taking: Reported on 03/11/2019)  . temazepam (RESTORIL) 15 MG capsule TAKE 1 AT NIGHT (DOSE REDUCED)  . [DISCONTINUED] cephALEXin (KEFLEX) 500 MG capsule Take 1 capsule (500 mg total) by mouth 2 (two) times daily.  . [DISCONTINUED] loperamide  (IMODIUM) 2 MG capsule Take by mouth.  . [DISCONTINUED] midodrine (PROAMATINE) 2.5 MG tablet Take 2.5 mg by mouth daily.   No facility-administered encounter medications on file as of 03/11/2019.    Activities of Daily Living In your present state of health, do you have any difficulty performing the following activities: 03/11/2019  Hearing? N  Vision? Y  Comment wears contacts  Difficulty concentrating or making decisions? Y  Comment hard time remembering things  Walking or climbing stairs? N  Dressing or bathing? N  Doing errands, shopping? N  Preparing Food and eating ? N  Using the Toilet? N  In the past six months, have you accidently leaked urine? N  Do you have problems with loss of bowel control? Y  Comment has incontinence fro lower bowel cancer  Managing your Medications? N  Managing your Finances? N  Housekeeping or managing your Housekeeping? N  Some recent data might be hidden    Patient Care Team: Hali Marry, MD as PCP - General (Family Medicine) Elsie Stain, MD (Pulmonary Disease) Wendie Chess, MD (Cardiology)    Assessment:   This is a routine wellness examination for [place].Physical assessment deferred to PCP.   Exercise Activities and Dietary recommendations Current Exercise Habits: The patient does not participate in regular exercise at present Diet  Eats a healthy diet of fruits, vegetbales and proteins. Breakfast: eggs, potatoes or blueberry muffins Lunch: skips mostly Dinner:  Meat and vegetables     Drinks 4 glasses of water daily.  Goals    . Patient Stated     Would like to be able to get of the pain pills and get some back relief.       Fall Risk Fall Risk  03/11/2019 03/10/2018 03/15/2016  Falls in the past year? 0 0 No  Risk for fall due to : Medication side effect;Orthopedic patient Medication side  effect;Mental status change;Impaired balance/gait -  Follow up Falls prevention discussed Falls prevention discussed -   Is  the patient's home free of loose throw rugs in walkways, pet beds, electrical cords, etc?   yes      Grab bars in the bathroom? yes      Handrails on the stairs?   no      Adequate lighting?   yes   Depression Screen PHQ 2/9 Scores 03/11/2019 03/10/2018 03/15/2016  PHQ - 2 Score 1 2 6   PHQ- 9 Score - 9 22  Exception Documentation Medical reason - -     Cognitive Function MMSE - Mini Mental State Exam 03/15/2016  Not completed: Unable to complete     6CIT Screen 03/11/2019 03/10/2018  What Year? 0 points 0 points  What month? 0 points 0 points  What time? 0 points 0 points  Count back from 20 0 points 0 points  Months in reverse 0 points -  Repeat phrase 0 points 0 points  Total Score 0 -    Immunization History  Administered Date(s) Administered  . Influenza Split 11/09/2010, 12/04/2011  . Influenza Whole 11/14/2006, 11/11/2007, 11/15/2008  . Influenza,inj,Quad PF,6+ Mos 12/01/2013  . Influenza-Unspecified 11/01/2017  . Pneumococcal Polysaccharide-23 04/05/2011  . Td 05/06/2008  . Tdap 01/23/2019    Screening Tests Health Maintenance  Topic Date Due  . PAP SMEAR-Modifier  01/23/2014  . INFLUENZA VACCINE  08/02/2018  . MAMMOGRAM  09/13/2019  . COLONOSCOPY  07/28/2021  . TETANUS/TDAP  01/22/2029  . HIV Screening  Completed      Plan:    Please schedule your next medicare wellness visit with me in 1 yr.  Ms. Kukulski , Thank you for taking time to come for your Medicare Wellness Visit. I appreciate your ongoing commitment to your health goals. Please review the following plan we discussed and let me know if I can assist you in the future.  Continue doing brain stimulating activities (puzzles, reading, adult coloring books, staying active) to keep memory sharp.     These are the goals we discussed: Goals    . Patient Stated     Would like to be able to get of the pain pills and get some back relief.       This is a list of the screening recommended for you and  due dates:  Health Maintenance  Topic Date Due  . Pap Smear  01/23/2014  . Flu Shot  08/02/2018  . Mammogram  09/13/2019  . Colon Cancer Screening  07/28/2021  . Tetanus Vaccine  01/22/2029  . HIV Screening  Completed     I have personally reviewed and noted the following in the patient's chart:   . Medical and social history . Use of alcohol, tobacco or illicit drugs  . Current medications and supplements . Functional ability and status . Nutritional status . Physical activity . Advanced directives . List of other physicians . Hospitalizations, surgeries, and ER visits in previous 12 months . Vitals . Screenings to include cognitive, depression, and falls . Referrals and appointments  In addition, I have reviewed and discussed with patient certain preventive protocols, quality metrics, and best practice recommendations. A written personalized care plan for preventive services as well as general preventive health recommendations were provided to patient.     Joanne Chars, LPN  [phone]

## 2019-02-25 NOTE — Progress Notes (Signed)
    Procedures performed today:    Laceration repair: Indication: bleeding Location: Left index finger Size: 1 cm Anesthesia: 1%lidocaine without epi infiltrated in a flexor digitorum digital block, good effect Wound explored, irrigated, FB removed (if present) Type of suture material: 5-0 Ethilon Number of sutures: 2 Tolerated well Routine postprocedure instructions d/w pt- keep area clean and bandaged, follow up if concerns/spreading erythema/pain.   Follow up for for suture removal.  Independent interpretation of tests performed by another provider:   None.  Impression and Recommendations:    Laceration of thumb, right Donnas thumb looks good, I performed a complex repair just over a month ago. Today I removed the last suture. Return as needed for this.  Laceration of index finger of left hand without complication Jaelyne also just lacerated her left index finger.  It is a small laceration, see above for repair template. Return in 1 week for suture removal.    ___________________________________________ Gwen Her. [name], M.D., ABFM., CAQSM. Primary Care and [place] Instructor of [place] of [facility] of Medicine

## 2019-03-04 ENCOUNTER — Other Ambulatory Visit: Payer: Self-pay

## 2019-03-04 ENCOUNTER — Ambulatory Visit (INDEPENDENT_AMBULATORY_CARE_PROVIDER_SITE_OTHER): Payer: Medicare Other | Admitting: Sports Medicine

## 2019-03-04 DIAGNOSIS — S61211D Laceration without foreign body of left index finger without damage to nail, subsequent encounter: Secondary | ICD-10-CM | POA: Diagnosis not present

## 2019-03-04 NOTE — Assessment & Plan Note (Signed)
Mary [name] returns, she had a laceration of her left index anger about a week ago. I remove the sutures today, the the wound did display some dehiscence so we closed it with Dermabond. Return as needed.

## 2019-03-04 NOTE — Progress Notes (Signed)
    Procedures performed today:    None.  Independent interpretation of notes and tests performed by another provider:   None.  Impression and Recommendations:    Laceration of index finger of left hand without complication Mary [name] returns, she had a laceration of her left index anger about a week ago. I remove the sutures today, the the wound did display some dehiscence so we closed it with Dermabond. Return as needed.    ___________________________________________ Gwen Her. [name], M.D., ABFM., CAQSM. Primary Care and [place] Instructor of [place] of [facility] of Medicine

## 2019-03-11 ENCOUNTER — Ambulatory Visit (INDEPENDENT_AMBULATORY_CARE_PROVIDER_SITE_OTHER): Payer: Medicare Other | Admitting: *Deleted

## 2019-03-11 ENCOUNTER — Other Ambulatory Visit: Payer: Self-pay

## 2019-03-11 VITALS — BP 127/74 | HR 87 | Ht 64.0 in | Wt 136.0 lb

## 2019-03-11 DIAGNOSIS — Z0000 Encounter for general adult medical examination without abnormal findings: Secondary | ICD-10-CM | POA: Diagnosis not present

## 2019-03-11 NOTE — Patient Instructions (Addendum)
Please schedule your next medicare wellness visit with me in 1 yr.  Mary [name] , Thank you for taking time to come for your Medicare Wellness Visit. I appreciate your ongoing commitment to your health goals. Please review the following plan we discussed and let me know if I can assist you in the future.  Continue doing brain stimulating activities (puzzles, reading, adult coloring books, staying active) to keep memory sharp.   These are the goals we discussed: Goals    . Patient Stated     Would like to be able to get of the pain pills and get some back relief.

## 2019-07-03 ENCOUNTER — Telehealth: Payer: Self-pay

## 2019-07-03 NOTE — Telephone Encounter (Signed)
Patient left msg on triage line at 4:41 on Wednesday 07/01/19 wanting virtual appointment or medication called in for someblisters. She denied any sunburn, just blisters.  Messages to get additional information and schedule appointment were left both 7/1 and 7/2 in the morning with no call back.  Patient needs appt to be evaluated.

## 2019-07-30 LAB — HM COLONOSCOPY

## 2019-07-31 ENCOUNTER — Encounter: Payer: Self-pay | Admitting: Neurology

## 2019-09-01 LAB — HM MAMMOGRAPHY

## 2019-09-22 ENCOUNTER — Telehealth: Payer: Self-pay

## 2019-09-22 NOTE — Telephone Encounter (Signed)
Mary [name] tested positive for Covid yesterday. She started having symptoms two days ago. Chills, cough, fatigue and body aches. She has been scheduled for a virtual visit in the morning.   Advised -   People with COVID-19 have had a wide range of symptoms reported - ranging from mild symptoms to severe illness. Symptoms may appear 2-14 days after exposure to the virus. People with these symptoms may have COVID-19:  Fever or chills  Cough  Shortness of breath or difficulty breathing  Fatigue  Muscle or body aches  Headache  New loss of taste or smell  Sore throat  Congestion or runny nose  Nausea or vomiting  Diarrhea  When to Seek Emergency Medical Attention Look for emergency warning signs* for COVID-19. If someone is showing any of these signs, seek emergency medical care immediately  Trouble breathing  Persistent pain or pressure in the chest  New confusion  Inability to wake or stay awake  Bluish lips or face  How to self-isolate   Use a separate room and bathroom for sick household members (if possible).  Wash your hands often with soap and water for at least 20 seconds, especially after blowing your nose, coughing, or sneezing; going to the bathroom; and before eating or preparing food.  If soap and water are not readily available, use an alcohol-based hand sanitizer with at least 60% alcohol. Always wash hands with soap and water if hands are visibly dirty.  Provide your sick household member with clean disposable facemasks to wear at home, if available, to help prevent spreading COVID-19 to others.  Clean the sick room and bathroom, as needed, to avoid unnecessary contact with the sick person.  Avoid sharing personal items like utensils, food, and drinks.   If you feel healthy but:  Recently had close contact with a person with COVID-19 Steps to take Stay Home and [place] (Quarantine)  Stay home until 14 days after your last  exposure.  Check your temperature twice a day and watch for symptoms of COVID-19.  If possible, stay away from people who are at higher-risk for getting very sick from COVID-19.  If you:  Have been diagnosed with COVID-19, or  Are waiting for test results, or  Have cough, fever, or shortness of breath, or other symptoms of COVID-19 Steps to take Isolate Yourself from Others (Isolation)  Stay home until it is safe to be around others.  If you live with others, stay in a specific sick room or area and away from other people or animals, including pets. Use a separate bathroom, if available.  Read important information about caring for yourself or someone else who is sick, including when it's safe to end home isolation.

## 2019-09-23 ENCOUNTER — Encounter: Payer: Self-pay | Admitting: Physician Assistant

## 2019-09-23 ENCOUNTER — Telehealth (INDEPENDENT_AMBULATORY_CARE_PROVIDER_SITE_OTHER): Payer: Medicare Other | Admitting: Physician Assistant

## 2019-09-23 ENCOUNTER — Other Ambulatory Visit: Payer: Self-pay | Admitting: Infectious Diseases

## 2019-09-23 ENCOUNTER — Telehealth: Payer: Self-pay | Admitting: Infectious Diseases

## 2019-09-23 VITALS — Temp 101.0°F

## 2019-09-23 DIAGNOSIS — U071 COVID-19: Secondary | ICD-10-CM

## 2019-09-23 DIAGNOSIS — Q2112 Patent foramen ovale: Secondary | ICD-10-CM

## 2019-09-23 DIAGNOSIS — Z1509 Genetic susceptibility to other malignant neoplasm: Secondary | ICD-10-CM

## 2019-09-23 DIAGNOSIS — I4811 Longstanding persistent atrial fibrillation: Secondary | ICD-10-CM

## 2019-09-23 NOTE — Progress Notes (Signed)
Patient ID: Mary [name], female   DOB: [birth year], 54 y.o.   MRN: [id number] .Marland KitchenVirtual Visit via Telephone Note  I connected with Roe Rutherford on 09/23/19 at  7:10 AM EDT by telephone and verified that I am speaking with the correct person using two identifiers.  Location: Patient: home Provider: clinic   I discussed the limitations, risks, security and privacy concerns of performing an evaluation and management service by telephone and the availability of in person appointments. I also discussed with the patient that there may be a patient responsible charge related to this service. The patient expressed understanding and agreed to proceed.   History of Present Illness: Patient is a 55 year old female who began having symptoms of Covid on Sunday night and tested positive yesterday 09/22/2019.  Her husband also is positive for Covid.  Patient reports extreme fatigue, headaches, loss of smell and taste, nose burning, shallow breathing, chest tightness with deep cough.  Patient is concerned with all of her ongoing health issues such as her adenocarcinoma, new pulmonary nodule, history of pneumonia.  Patient admits she is really not doing anything but laying down.  She does have a fever of 101.  .. Active Ambulatory Problems    Diagnosis Date Noted   DEPRESSION, MAJOR, RECURRENT 10/09/2005   ANXIETY 10/09/2005   TOBACCO DEPENDENCE 10/09/2005   MITRAL VALVE DISORDER 10/09/2005   ATRIAL FIBRILLATION 10/09/2005   Obstructive chronic bronchitis without exacerbation ([place]) 07/01/2008   DIVERTICULOSIS OF COLON 02/20/2007   Chronic constipation 11/19/2005   OVARIAN CYST 02/06/2007   AMENORRHEA, SECONDARY 12/06/2009   MENORRHAGIA, PERIMENOPAUSAL 05/06/2008   BACK PAIN W/RADIATION, UNSPECIFIED 10/09/2005   Myalgia and myositis, unspecified 05/25/2008   EDEMA 09/16/2009   NIGHT SWEATS 02/07/2010   PULMONARY NODULE 03/13/2010   Rhinitis due to pollen 04/20/2010   Personal  history of colonic polyps 02/13/2011   Constipation 02/13/2011   GERD (gastroesophageal reflux disease) 02/13/2011   Metrorrhagia 02/14/2011   Adenocarcinoma of small bowel ([place]) 02/19/2011   Lynch syndrome 03/06/2011   DUB (dysfunctional uterine bleeding) 03/06/2011   PFO (patent foramen ovale) 07/25/2011   Sinus tachycardia 03/30/2013   Laceration of thumb, right 01/23/2019   Laceration of index finger of left hand without complication 21/19/4174   Resolved Ambulatory Problems    Diagnosis Date Noted   Pneumonia, organism unspecified([facility]) 11/23/2009   UTI 11/04/2006   CALF PAIN, LEFT 09/16/2009   Cough 11/23/2009   Abdominal pain, epigastric 11/15/2008   Abdominal pain 02/13/2011   Abdominal pain, unspecified site 02/19/2011   Past Medical History:  Diagnosis Date   Anxiety    Complication of anesthesia    H/O: hysterectomy    Herniated disc    Mental disorder    MVP (mitral valve prolapse)    PAF (paroxysmal atrial fibrillation) ([facility])    Shortness of breath    Small bowel cancer ([place]) 01/23/2016   SVT (supraventricular tachycardia) ([facility])    Reviewed med, allergy, problem list.     Observations/Objective: Lethargic Dry cough No labored breathing  .Marland Kitchen Today's Vitals   09/23/19 0720  Temp: (!) 101 F (38.3 C)  TempSrc: Oral   There is no height or weight on file to calculate BMI.   Assessment and Plan: Marland KitchenMarland KitchenDiagnoses and all orders for this visit:  COVID-19 virus infection   I do not have a copy of her confirmed Covid positive test.  Her symptoms started Sunday.  Patient should be a candidate for monoclonal antibodies due to her atrial fibrillation,  PFO, history of pneumonia, adenocarcinoma of the lung small bowel.  Discussed symptomatic care with patient with ibuprofen, Tylenol, hydration, rest, 5-10 good deep breaths every hour, regular walking, compression stockings.  I do worry about her risk of blood clots.  I would like for her  to start a baby aspirin daily.  Discussed signs and symptoms of blood clots with patient.  Also encouraged her to get a pulse oximetry to measure O2 sats.  She is aware if they drop below 90 to seek emergency room care.  If symptoms worsen or not improving please follow-up.    Follow Up Instructions:    I discussed the assessment and treatment plan with the patient. The patient was provided an opportunity to ask questions and all were answered. The patient agreed with the plan and demonstrated an understanding of the instructions.   The patient was advised to call back or seek an in-person evaluation if the symptoms worsen or if the condition fails to improve as anticipated.  I provided 10 minutes of non-face-to-face time during this encounter.   Iran Planas, PA-C

## 2019-09-23 NOTE — Telephone Encounter (Signed)
Called to Discuss with patient about Covid symptoms and the use of the monoclonal antibody infusion for those with mild to moderate Covid symptoms and at a high risk of hospitalization.     Pt appears to qualify for this infusion due to co-morbid conditions and/or a member of an at-risk group in accordance with the FDA Emergency Use Authorization.    Sx started 9/19  Qualifiers include immunocompromising condition.   Will get her scheduled for tomorrow. Her fiance can bring her.

## 2019-09-23 NOTE — Progress Notes (Signed)
I connected by phone with Mary [name] on 09/23/2019 at 12:48 PM to discuss the potential use of a new treatment for mild to moderate COVID-19 viral infection in non-hospitalized patients.  This patient is a 54 y.o. female that meets the FDA criteria for Emergency Use Authorization of COVID monoclonal antibody casirivimab/imdevimab.  Has a (+) direct SARS-CoV-2 viral test result  Has mild or moderate COVID-19   Is NOT hospitalized due to COVID-19  Is within 10 days of symptom onset  Has at least one of the high risk factor(s) for progression to severe COVID-19 and/or hospitalization as defined in EUA.  Specific high risk criteria : Immunosuppressive Disease or Treatment   I have spoken and communicated the following to the patient or parent/caregiver regarding COVID monoclonal antibody treatment:  1. FDA has authorized the emergency use for the treatment of mild to moderate COVID-19 in adults and pediatric patients with positive results of direct SARS-CoV-2 viral testing who are 66 years of age and older weighing at least 40 kg, and who are at high risk for progressing to severe COVID-19 and/or hospitalization.  2. The significant known and potential risks and benefits of COVID monoclonal antibody, and the extent to which such potential risks and benefits are unknown.  3. Information on available alternative treatments and the risks and benefits of those alternatives, including clinical trials.  4. Patients treated with COVID monoclonal antibody should continue to self-isolate and use infection control measures (e.g., wear mask, isolate, social distance, avoid sharing personal items, clean and disinfect "high touch" surfaces, and frequent handwashing) according to CDC guidelines.   5. The patient or parent/caregiver has the option to accept or refuse COVID monoclonal antibody treatment.  After reviewing this information with the patient, The patient agreed to proceed with receiving  casirivimab\imdevimab infusion and will be provided a copy of the Fact sheet prior to receiving the infusion. Mary [name] 09/23/2019 12:48 PM

## 2019-09-24 ENCOUNTER — Ambulatory Visit (HOSPITAL_COMMUNITY)
Admission: RE | Admit: 2019-09-24 | Discharge: 2019-09-24 | Disposition: A | Discharge status: Home / Self Care | Payer: Medicare Other | Source: Ambulatory Visit | Attending: Pulmonary Disease | Admitting: Pulmonary Disease

## 2019-09-24 ENCOUNTER — Other Ambulatory Visit (HOSPITAL_COMMUNITY): Payer: Self-pay

## 2019-09-24 DIAGNOSIS — U071 COVID-19: Secondary | ICD-10-CM | POA: Insufficient documentation

## 2019-09-24 DIAGNOSIS — Q211 Atrial septal defect: Secondary | ICD-10-CM | POA: Insufficient documentation

## 2019-09-24 DIAGNOSIS — I4811 Longstanding persistent atrial fibrillation: Secondary | ICD-10-CM | POA: Insufficient documentation

## 2019-09-24 DIAGNOSIS — Z1509 Genetic susceptibility to other malignant neoplasm: Secondary | ICD-10-CM | POA: Insufficient documentation

## 2019-09-24 DIAGNOSIS — Z23 Encounter for immunization: Secondary | ICD-10-CM | POA: Diagnosis not present

## 2019-09-24 DIAGNOSIS — Q2112 Patent foramen ovale: Secondary | ICD-10-CM

## 2019-09-24 MED ORDER — SODIUM CHLORIDE 0.9 % IV SOLN: INTRAVENOUS | Status: DC | PRN

## 2019-09-24 MED ORDER — ALBUTEROL SULFATE HFA 108 (90 BASE) MCG/ACT IN AERS: 2.0000 | INHALATION_SPRAY | Freq: Once | RESPIRATORY_TRACT | Status: DC | PRN

## 2019-09-24 MED ORDER — ONDANSETRON HCL 4 MG/2ML IJ SOLN: 4.0000 mg | Freq: Once | INTRAMUSCULAR | Status: AC

## 2019-09-24 MED ORDER — METHYLPREDNISOLONE SODIUM SUCC 125 MG IJ SOLR: 125.0000 mg | Freq: Once | INTRAMUSCULAR | Status: DC | PRN

## 2019-09-24 MED ORDER — FAMOTIDINE IN NACL 20-0.9 MG/50ML-% IV SOLN: 20.0000 mg | Freq: Once | INTRAVENOUS | Status: DC | PRN

## 2019-09-24 MED ORDER — SODIUM CHLORIDE 0.9 % IV SOLN
1200.0000 mg | Freq: Once | INTRAVENOUS | Status: AC
  Administered 2019-09-24: 1200 mg via INTRAVENOUS

## 2019-09-24 MED ORDER — ONDANSETRON HCL 4 MG/2ML IJ SOLN
INTRAMUSCULAR | Status: AC
  Administered 2019-09-24: 4 mg via INTRAVENOUS
  Filled 2019-09-24: qty 2

## 2019-09-24 MED ORDER — DIPHENHYDRAMINE HCL 50 MG/ML IJ SOLN: 50.0000 mg | Freq: Once | INTRAMUSCULAR | Status: DC | PRN

## 2019-09-24 MED ORDER — EPINEPHRINE 0.3 MG/0.3ML IJ SOAJ: 0.3000 mg | Freq: Once | INTRAMUSCULAR | Status: DC | PRN

## 2019-09-24 NOTE — Progress Notes (Signed)
°  Diagnosis: COVID-19  Physician: Asencion Noble, MD  Procedure: Covid Infusion Clinic Med: casirivimab\imdevimab infusion - Provided patient with casirivimab\imdevimab fact sheet for patients, parents and caregivers prior to infusion.  Complications: No immediate complications noted.  Discharge: Discharged home   Janne Napoleon 09/24/2019

## 2019-09-24 NOTE — Discharge Instructions (Signed)

## 2019-10-26 ENCOUNTER — Ambulatory Visit (INDEPENDENT_AMBULATORY_CARE_PROVIDER_SITE_OTHER): Payer: Medicare Other | Admitting: Obstetrics & Gynecology

## 2019-10-26 ENCOUNTER — Encounter: Payer: Self-pay | Admitting: Obstetrics & Gynecology

## 2019-10-26 ENCOUNTER — Other Ambulatory Visit: Payer: Self-pay

## 2019-10-26 VITALS — BP 102/68 | HR 71 | Ht 64.0 in | Wt 133.0 lb

## 2019-10-26 DIAGNOSIS — Z01411 Encounter for gynecological examination (general) (routine) with abnormal findings: Secondary | ICD-10-CM

## 2019-10-26 DIAGNOSIS — R232 Flushing: Secondary | ICD-10-CM

## 2019-10-26 MED ORDER — ESTRADIOL 0.1 MG/24HR TD PTTW: 1.0000 | MEDICATED_PATCH | TRANSDERMAL | 12 refills | Status: AC

## 2019-10-26 NOTE — Patient Instructions (Signed)
COVID-19 Vaccine Information can be found at: [email or web address] For questions related to vaccine distribution or appointments, please email [email or web address] or call [phone].    

## 2019-10-26 NOTE — Progress Notes (Signed)
Subjective:     Mary [name] is a 54 y.o. female here for a routine exam.  Current complaints: chronic constipation, hernia does not allow her to have PT.  Pt has some stool incontinence from thinning stools so that stool can pass through stricture.    Gynecologic History Patient's last menstrual period was 03/10/2011. Contraception: post menopausal status Paps no indicated--s/p hyst Last mammogram: 08/2019. Results were: normal  Obstetric History OB History  Gravida Para Term Preterm AB Living  1       1    SAB TAB Ectopic Multiple Live Births    1          # Outcome Date GA Lbr Len/2nd Weight Sex Delivery Anes PTL Lv  1 TAB              The following portions of the patient's history were reviewed and updated as appropriate: allergies, current medications, past family history, past medical history, past social history, past surgical history and problem list.  Review of Systems Pertinent items noted in HPI and remainder of comprehensive ROS otherwise negative.    Objective:      Vitals:   10/26/19 1324  BP: 102/68  Pulse: 71  Weight: 133 lb (60.3 kg)   Vitals:  WNL General appearance: alert, cooperative and no distress  HEENT: Normocephalic, without obvious abnormality, atraumatic Eyes: negative Throat: lips, mucosa, and tongue normal; teeth and gums normal  Respiratory: Clear to auscultation bilaterally  CV: Regular rate and rhythm  Breasts:  Normal appearance, no masses or tenderness, no nipple retraction or dimpling  GI: Soft, non-tender; bowel sounds normal; no masses,  no organomegaly; +midline incisional hernia  GU: External Genitalia:  Tanner V, no lesion Urethra:  No prolapse   Vagina: Pink, normal rugae, no blood or discharge  Cervix: surgically absent  Uterus:  Surgically absent  Adnexa: Surgically absent  Musculoskeletal: No edema, redness or tenderness in the calves or thighs  Skin: No lesions or rash  Lymphatic: Axillary adenopathy: none      Psychiatric: Normal mood and behavior        Assessment:    Healthy female exam.   Menopausal Sx--on Vivelle Dot   Plan:  1.  No pap indicated 2.  Yearly mammogram 3.  Cont HRT; pt is not interested in weaning at this time.  Aware of risks of HRT 4.  No vulvar irritation.

## 2019-10-26 NOTE — Progress Notes (Signed)
Colonscopy 07/30/2019 Mammogram 10/02/2019

## 2020-01-14 DIAGNOSIS — G894 Chronic pain syndrome: Secondary | ICD-10-CM | POA: Diagnosis not present

## 2020-01-14 DIAGNOSIS — M5442 Lumbago with sciatica, left side: Secondary | ICD-10-CM | POA: Diagnosis not present

## 2020-01-14 DIAGNOSIS — M5417 Radiculopathy, lumbosacral region: Secondary | ICD-10-CM | POA: Diagnosis not present

## 2020-01-14 DIAGNOSIS — M5441 Lumbago with sciatica, right side: Secondary | ICD-10-CM | POA: Diagnosis not present

## 2020-01-14 DIAGNOSIS — M25552 Pain in left hip: Secondary | ICD-10-CM | POA: Diagnosis not present

## 2020-01-14 DIAGNOSIS — G8929 Other chronic pain: Secondary | ICD-10-CM | POA: Diagnosis not present

## 2020-01-14 DIAGNOSIS — M48062 Spinal stenosis, lumbar region with neurogenic claudication: Secondary | ICD-10-CM | POA: Diagnosis not present

## 2020-01-14 DIAGNOSIS — M25551 Pain in right hip: Secondary | ICD-10-CM | POA: Diagnosis not present

## 2020-02-18 DIAGNOSIS — M5127 Other intervertebral disc displacement, lumbosacral region: Secondary | ICD-10-CM | POA: Diagnosis not present

## 2020-02-18 DIAGNOSIS — Z79891 Long term (current) use of opiate analgesic: Secondary | ICD-10-CM | POA: Diagnosis not present

## 2020-03-16 DIAGNOSIS — M5416 Radiculopathy, lumbar region: Secondary | ICD-10-CM | POA: Diagnosis not present

## 2020-03-17 DIAGNOSIS — Z114 Encounter for screening for human immunodeficiency virus [HIV]: Secondary | ICD-10-CM | POA: Diagnosis not present

## 2020-03-17 DIAGNOSIS — I471 Supraventricular tachycardia: Secondary | ICD-10-CM | POA: Diagnosis not present

## 2020-03-17 DIAGNOSIS — Z130 Encounter for screening for diseases of the blood and blood-forming organs and certain disorders involving the immune mechanism: Secondary | ICD-10-CM | POA: Diagnosis not present

## 2020-03-17 DIAGNOSIS — Z136 Encounter for screening for cardiovascular disorders: Secondary | ICD-10-CM | POA: Diagnosis not present

## 2020-03-29 DIAGNOSIS — M5127 Other intervertebral disc displacement, lumbosacral region: Secondary | ICD-10-CM | POA: Diagnosis not present

## 2020-03-29 DIAGNOSIS — Z79891 Long term (current) use of opiate analgesic: Secondary | ICD-10-CM | POA: Diagnosis not present

## 2021-06-23 ENCOUNTER — Encounter: Payer: Self-pay | Admitting: Family Medicine
